# Patient Record
Sex: Female | Born: 1980 | Race: White | Hispanic: No | Marital: Single | Smoking: Current every day smoker
Health system: Southern US, Community
[De-identification: ages and names within clinical notes are randomized; demographics above are authoritative.]

## PROBLEM LIST (undated history)

## (undated) DIAGNOSIS — R569 Unspecified convulsions: Secondary | ICD-10-CM

## (undated) DIAGNOSIS — N63 Unspecified lump in unspecified breast: Secondary | ICD-10-CM

## (undated) DIAGNOSIS — R87619 Unspecified abnormal cytological findings in specimens from cervix uteri: Secondary | ICD-10-CM

## (undated) DIAGNOSIS — D649 Anemia, unspecified: Secondary | ICD-10-CM

## (undated) DIAGNOSIS — R519 Headache, unspecified: Secondary | ICD-10-CM

## (undated) DIAGNOSIS — R51 Headache: Principal | ICD-10-CM

## (undated) DIAGNOSIS — Z87442 Personal history of urinary calculi: Secondary | ICD-10-CM

## (undated) HISTORY — DX: Unspecified abnormal cytological findings in specimens from cervix uteri: R87.619

## (undated) HISTORY — PX: OVARIAN CYST REMOVAL: SHX89

## (undated) HISTORY — PX: CERVIX REMOVAL: SHX592

## (undated) HISTORY — DX: Headache: R51

## (undated) HISTORY — DX: Headache, unspecified: R51.9

## (undated) HISTORY — PX: ABLATION: SHX5711

## (undated) HISTORY — PX: OTHER SURGICAL HISTORY: SHX169

---
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
## 2004-06-11 ENCOUNTER — Emergency Department: Payer: Self-pay | Admitting: General Practice

## 2004-10-09 ENCOUNTER — Emergency Department: Payer: Self-pay | Admitting: Unknown Physician Specialty

## 2005-02-21 ENCOUNTER — Emergency Department: Payer: Self-pay | Admitting: Emergency Medicine

## 2005-11-08 ENCOUNTER — Emergency Department: Payer: Self-pay | Admitting: General Practice

## 2006-01-13 ENCOUNTER — Emergency Department: Payer: Self-pay | Admitting: Emergency Medicine

## 2006-01-16 ENCOUNTER — Emergency Department: Payer: Self-pay | Admitting: Emergency Medicine

## 2006-01-17 ENCOUNTER — Ambulatory Visit: Payer: Self-pay | Admitting: Emergency Medicine

## 2006-08-15 ENCOUNTER — Emergency Department: Payer: Self-pay | Admitting: Emergency Medicine

## 2008-03-17 ENCOUNTER — Emergency Department: Payer: Self-pay | Admitting: Emergency Medicine

## 2008-08-12 HISTORY — PX: TONSILLECTOMY: SUR1361

## 2009-02-19 ENCOUNTER — Inpatient Hospital Stay: Payer: Self-pay | Admitting: Vascular Surgery

## 2009-08-14 ENCOUNTER — Emergency Department: Payer: Self-pay | Admitting: Emergency Medicine

## 2009-08-29 ENCOUNTER — Ambulatory Visit: Payer: Self-pay | Admitting: Otolaryngology

## 2009-12-16 ENCOUNTER — Emergency Department: Payer: Self-pay | Admitting: Emergency Medicine

## 2010-01-26 ENCOUNTER — Emergency Department: Payer: Self-pay | Admitting: Emergency Medicine

## 2010-03-05 ENCOUNTER — Emergency Department: Payer: Self-pay | Admitting: Emergency Medicine

## 2010-04-19 ENCOUNTER — Emergency Department: Payer: Self-pay | Admitting: Emergency Medicine

## 2010-08-12 HISTORY — PX: APPENDECTOMY: SHX54

## 2010-10-25 ENCOUNTER — Emergency Department: Payer: Self-pay | Admitting: Emergency Medicine

## 2011-03-19 ENCOUNTER — Observation Stay: Payer: Self-pay | Admitting: Obstetrics and Gynecology

## 2011-03-27 ENCOUNTER — Observation Stay: Payer: Self-pay | Admitting: Obstetrics and Gynecology

## 2011-03-29 ENCOUNTER — Observation Stay: Payer: Self-pay | Admitting: Obstetrics & Gynecology

## 2011-04-01 ENCOUNTER — Inpatient Hospital Stay: Payer: Self-pay | Admitting: Advanced Practice Midwife

## 2011-07-01 ENCOUNTER — Ambulatory Visit: Payer: Self-pay

## 2011-07-03 ENCOUNTER — Ambulatory Visit: Payer: Self-pay | Admitting: Obstetrics and Gynecology

## 2011-07-05 ENCOUNTER — Ambulatory Visit: Payer: Self-pay | Admitting: Obstetrics and Gynecology

## 2011-07-08 LAB — PATHOLOGY REPORT

## 2011-07-25 ENCOUNTER — Emergency Department: Payer: Self-pay | Admitting: Emergency Medicine

## 2011-10-16 ENCOUNTER — Emergency Department: Payer: Self-pay | Admitting: *Deleted

## 2012-06-10 ENCOUNTER — Ambulatory Visit: Payer: Self-pay | Admitting: Obstetrics & Gynecology

## 2012-06-10 LAB — CBC
HCT: 41.1 % (ref 35.0–47.0)
HGB: 14.5 g/dL (ref 12.0–16.0)
MCH: 31 pg (ref 26.0–34.0)
MCHC: 35.3 g/dL (ref 32.0–36.0)
MCV: 88 fL (ref 80–100)
Platelet: 163 10*3/uL (ref 150–440)
RBC: 4.68 10*6/uL (ref 3.80–5.20)
RDW: 12.7 % (ref 11.5–14.5)
WBC: 4.6 10*3/uL (ref 3.6–11.0)

## 2012-06-10 LAB — PREGNANCY, URINE: Pregnancy Test, Urine: NEGATIVE m[IU]/mL

## 2012-06-18 ENCOUNTER — Ambulatory Visit: Payer: Self-pay | Admitting: Obstetrics & Gynecology

## 2012-06-22 LAB — PATHOLOGY REPORT

## 2012-12-23 ENCOUNTER — Emergency Department: Payer: Self-pay | Admitting: Emergency Medicine

## 2013-04-06 ENCOUNTER — Emergency Department: Payer: Self-pay | Admitting: Emergency Medicine

## 2013-09-09 ENCOUNTER — Ambulatory Visit (INDEPENDENT_AMBULATORY_CARE_PROVIDER_SITE_OTHER): Payer: BC Managed Care – PPO | Admitting: Internal Medicine

## 2013-09-09 ENCOUNTER — Encounter: Payer: Self-pay | Admitting: Internal Medicine

## 2013-09-09 ENCOUNTER — Telehealth: Payer: Self-pay

## 2013-09-09 VITALS — BP 98/64 | HR 81 | Temp 98.5°F | Ht 64.5 in | Wt 155.5 lb

## 2013-09-09 DIAGNOSIS — R51 Headache: Secondary | ICD-10-CM

## 2013-09-09 DIAGNOSIS — F39 Unspecified mood [affective] disorder: Secondary | ICD-10-CM

## 2013-09-09 DIAGNOSIS — R519 Headache, unspecified: Secondary | ICD-10-CM

## 2013-09-09 DIAGNOSIS — R4586 Emotional lability: Secondary | ICD-10-CM

## 2013-09-09 MED ORDER — BUTALBITAL-APAP-CAFFEINE 50-325-40 MG PO TABS: 1.0000 | ORAL_TABLET | Freq: Two times a day (BID) | ORAL | Status: DC | PRN

## 2013-09-09 MED ORDER — SERTRALINE HCL 50 MG PO TABS: 50.0000 mg | ORAL_TABLET | Freq: Every day | ORAL | Status: DC

## 2013-09-09 NOTE — Progress Notes (Signed)
Pre-visit discussion using our clinic review tool. No additional management support is needed unless otherwise documented below in the visit note.  

## 2013-09-09 NOTE — Assessment & Plan Note (Signed)
Likely tensions r/t stress Discussed the importance of stress relieving techniques Asked her to keep a headache log and will discuss at her next visit Will try fioricet for headaches

## 2013-09-09 NOTE — Assessment & Plan Note (Signed)
R/t stress at home She is having issues with her teenage daughter Will start zoloft 50 mg daily  Let me know in 4-6 weeks how you are doing.

## 2013-09-09 NOTE — Telephone Encounter (Signed)
Diane Roberts with [facility] [address]wanted to confirm Fiorcet rx was faxed by our office. Webb Silversmith NP said her CMA did fax Fiorcet to pharmacy. Diane Roberts voiced understanding.

## 2013-09-09 NOTE — Patient Instructions (Signed)

## 2013-09-09 NOTE — Progress Notes (Signed)
HPI  She does report frequent headaches. These headaches started about 6 months ago. She reports that is starts in her temples and then spreads to the front of her head. She reports it feels like a squeezing sensation or throbbing. She reports that it sometimes settles behind her left eye and cause blurry vision in that eye. She has tried execdrin migraine and tylenol without much relief. Sometimes laying down in a dark room makes her feel better. Additionally, she c/o mood swings. This started about 1 year ago. She reports that she feels fine one minute then feels like she can fly off the handle the next minute. She reports that it is more anger and anxiety rather than depression. She is under a lot of stress at home. Additionally, she does have some concerns of swelling in her nasal passages and sores in her nose. She reports that her nose feels dry. She does blow her nose frequently at work and feels like the air is really dry in that environment. She has not noticed any drainage or blood from her nose. She has no history of allergies or asthma. She does smoke.  Past Medical History  Diagnosis Date  . Frequent headaches     Current Outpatient Prescriptions  Medication Sig Dispense Refill  . medroxyPROGESTERone (DEPO-PROVERA) 150 MG/ML injection Inject 150 mg into the muscle every 3 (three) months. Last shot 06/22/2013       No current facility-administered medications for this visit.    No Known Allergies  Family History  Problem Relation Age of Onset  . Hypertension Mother   . Kidney disease Mother   . Hyperlipidemia Mother     History   Social History  . Marital Status: Single    Spouse Name: N/A    Number of Children: N/A  . Years of Education: N/A   Occupational History  . Not on file.   Social History Main Topics  . Smoking status: Current Every Day Smoker -- 0.50 packs/day for 18 years    Types: Cigarettes  . Smokeless tobacco: Never Used  . Alcohol Use: Yes   Comment: occasional  . Drug Use: No  . Sexual Activity: Not on file   Other Topics Concern  . Not on file   Social History Narrative  . No narrative on file    ROS:  Constitutional: Denies fever, malaise, fatigue, headache or abrupt weight changes.  HEENT: Pt reports nasal dryness and soars in her nose. Denies eye pain, eye redness, ear pain, ringing in the ears, wax buildup, runny nose, nasal congestion, bloody nose, or sore throat. Neurological: Denies dizziness, difficulty with memory, difficulty with speech or problems with balance and coordination.  Psych: Pt reports stress, anger outburst. Denies depression, SI/HI.  No other specific complaints in a complete review of systems (except as listed in HPI above).  PE:  BP 98/64  Pulse 81  Temp(Src) 98.5 F (36.9 C) (Oral)  Ht 5' 4.5" (1.638 m)  Wt 155 lb 8 oz (70.534 kg)  BMI 26.29 kg/m2  SpO2 98%  LMP 12/10/2012 Wt Readings from Last 3 Encounters:  09/09/13 155 lb 8 oz (70.534 kg)    General: Appears her stated age, well developed, well nourished in NAD. HEENT: Head: normal shape and size; Eyes: sclera white, no icterus, conjunctiva pink, PERRLA and EOMs intact; Ears: Tm's gray and intact, normal light reflex; Nose: mucosa pink and dry, septum midline; Throat/Mouth: Teeth present, mucosa pink and moist, no lesions or ulcerations noted.  Cardiovascular:  Normal rate and rhythm. S1,S2 noted.  No murmur, rubs or gallops noted. No JVD or BLE edema. No carotid bruits noted. Pulmonary/Chest: Normal effort and positive vesicular breath sounds. No respiratory distress. No wheezes, rales or ronchi noted.  Psychiatric: Mood and affect normal. Behavior is normal. Judgment and thought content normal.      Assessment and Plan:  Dry nasal passages:  Likely due to the dry winter air and environmental work conditions Advised her to try OTC saline spray Stop smoking  RTC in 3 months for your physical and to discuss mood swings and  headaches. Please bring your log.

## 2013-09-10 ENCOUNTER — Telehealth: Payer: Self-pay | Admitting: Internal Medicine

## 2013-09-10 NOTE — Telephone Encounter (Signed)
Relevant patient education assigned to patient using Emmi. ° °

## 2013-11-04 ENCOUNTER — Telehealth: Payer: Self-pay | Admitting: Internal Medicine

## 2013-11-04 ENCOUNTER — Encounter: Payer: Self-pay | Admitting: Internal Medicine

## 2013-11-04 ENCOUNTER — Ambulatory Visit (INDEPENDENT_AMBULATORY_CARE_PROVIDER_SITE_OTHER): Payer: BC Managed Care – PPO | Admitting: Internal Medicine

## 2013-11-04 VITALS — BP 100/70 | HR 74 | Temp 98.2°F | Wt 159.0 lb

## 2013-11-04 DIAGNOSIS — M545 Low back pain, unspecified: Secondary | ICD-10-CM

## 2013-11-04 DIAGNOSIS — M5430 Sciatica, unspecified side: Secondary | ICD-10-CM

## 2013-11-04 MED ORDER — PREDNISONE 10 MG PO TABS
ORAL_TABLET | ORAL | Status: DC
Start: 2013-11-04 — End: 2014-02-24

## 2013-11-04 NOTE — Progress Notes (Signed)
Subjective:    Patient ID: Diane Roberts, female    DOB: 12-31-1980, 33 y.o.   MRN: [id number]  HPI  Pt presents to the clinic today with c/o low back pain. She reports this started 1 month ago. The pain radiates down her left leg but she reports it is not numbness and tingling. She was seen by chiropractor who did an xray and told L4 compression. She has tried Ibuprofen OTC without much relief. She has also been told that she has scoliosis. She has had pain like this in the past about 7 years ago.  Review of Systems      Past Medical History  Diagnosis Date  . Frequent headaches     Current Outpatient Prescriptions  Medication Sig Dispense Refill  . ibuprofen (ADVIL,MOTRIN) 200 MG tablet Take 800 mg by mouth every 4 (four) hours as needed.      . medroxyPROGESTERone (DEPO-PROVERA) 150 MG/ML injection Inject 150 mg into the muscle every 3 (three) months. Last shot 06/22/2013      . sertraline (ZOLOFT) 50 MG tablet Take 1 tablet (50 mg total) by mouth daily.  30 tablet  3  . butalbital-acetaminophen-caffeine (FIORICET, ESGIC) 50-325-40 MG per tablet Take 1 tablet by mouth 2 (two) times daily as needed for headache.  14 tablet  0   No current facility-administered medications for this visit.    No Known Allergies  Family History  Problem Relation Age of Onset  . Hypertension Mother   . Kidney disease Mother   . Hyperlipidemia Mother     History   Social History  . Marital Status: Single    Spouse Name: N/A    Number of Children: N/A  . Years of Education: N/A   Occupational History  . Not on file.   Social History Main Topics  . Smoking status: Current Every Day Smoker -- 0.50 packs/day for 18 years    Types: Cigarettes  . Smokeless tobacco: Never Used  . Alcohol Use: Yes     Comment: occasional  . Drug Use: No  . Sexual Activity: Yes   Other Topics Concern  . Not on file   Social History Narrative  . No narrative on file     Constitutional: Denies  fever, malaise, fatigue, headache or abrupt weight changes.  Musculoskeletal: Pt reports low back pain. Denies difficulty with gait, or joint pain and swelling.    No other specific complaints in a complete review of systems (except as listed in HPI above).  Objective:   Physical Exam   BP 100/70  Pulse 74  Temp(Src) 98.2 F (36.8 C) (Oral)  Wt 159 lb (72.122 kg)  SpO2 98% Wt Readings from Last 3 Encounters:  11/04/13 159 lb (72.122 kg)  09/09/13 155 lb 8 oz (70.534 kg)    General: Appears her stated age, well developed, well nourished in NAD. Cardiovascular: Normal rate and rhythm. S1,S2 noted.  No murmur, rubs or gallops noted. No JVD or BLE edema. No carotid bruits noted. Pulmonary/Chest: Normal effort and positive vesicular breath sounds. No respiratory distress. No wheezes, rales or ronchi noted.  Musculoskeletal: Decreased flexion secondary to pain. Normal extension. No difficulty with gait. Tender with palpation of the left SI joint.         Assessment & Plan:   Sciatica neuralgia with SI joint dysfunction:  Will try prednisone Avoid ibuprofen while on prednisone Stretching exercise given If no improvement, will consider xray vs PT  RTC as needed or if symptoms  persist or worsen

## 2013-11-04 NOTE — Progress Notes (Signed)
Pre visit review using our clinic review tool, if applicable. No additional management support is needed unless otherwise documented below in the visit note. 

## 2013-11-04 NOTE — Telephone Encounter (Signed)
Relevant patient education assigned to patient using Emmi. ° °

## 2013-11-04 NOTE — Patient Instructions (Addendum)

## 2013-12-10 ENCOUNTER — Other Ambulatory Visit: Payer: Self-pay | Admitting: *Deleted

## 2013-12-10 MED ORDER — MEDROXYPROGESTERONE ACETATE 150 MG/ML IM SUSP: 150.0000 mg | INTRAMUSCULAR | Status: DC

## 2013-12-10 NOTE — Telephone Encounter (Signed)
Ok to fill 

## 2013-12-31 ENCOUNTER — Telehealth: Payer: Self-pay

## 2013-12-31 NOTE — Telephone Encounter (Signed)
Is her gyn prescribing the depo? If so she needs to call their office

## 2013-12-31 NOTE — Telephone Encounter (Signed)
Pt took depo injection 3-4 weeks ago (was given to pt by her mother). Pt has been on depo inj for over a year; pt has had menstrual period (bright red blood) for 3-4 weeks and pt wants to know if she should have another Depo shot. No abd pain or cramping or clotting.Please advise.Pt request cb.

## 2013-12-31 NOTE — Telephone Encounter (Signed)
Pt states the refill was given by you and states there is no way she can be pregnant as she has not been sexually active in over a year

## 2013-12-31 NOTE — Telephone Encounter (Signed)
Pt is aware as instructed and i told pt that it would be best if she would purchase a calendar and mark off each week so that she can ensure it is exactly 12 weeks in between shots

## 2013-12-31 NOTE — Telephone Encounter (Signed)
Ok to continue. She needs to make sure she is giving it every 90 days. If she is late, that may be why she is experiencing the bleeding.

## 2013-12-31 NOTE — Telephone Encounter (Signed)
She should probably also make sure she is not pregnant

## 2014-02-24 ENCOUNTER — Telehealth: Payer: Self-pay | Admitting: Internal Medicine

## 2014-02-24 ENCOUNTER — Ambulatory Visit (INDEPENDENT_AMBULATORY_CARE_PROVIDER_SITE_OTHER): Payer: BC Managed Care – PPO | Admitting: Internal Medicine

## 2014-02-24 ENCOUNTER — Other Ambulatory Visit (HOSPITAL_COMMUNITY)
Admission: RE | Admit: 2014-02-24 | Discharge: 2014-02-24 | Disposition: A | Discharge status: Home / Self Care | Payer: BC Managed Care – PPO | Source: Ambulatory Visit | Attending: Internal Medicine | Admitting: Internal Medicine

## 2014-02-24 ENCOUNTER — Encounter: Payer: Self-pay | Admitting: Internal Medicine

## 2014-02-24 VITALS — BP 102/60 | HR 76 | Temp 98.4°F | Wt 155.5 lb

## 2014-02-24 DIAGNOSIS — Z01419 Encounter for gynecological examination (general) (routine) without abnormal findings: Secondary | ICD-10-CM | POA: Insufficient documentation

## 2014-02-24 DIAGNOSIS — N921 Excessive and frequent menstruation with irregular cycle: Secondary | ICD-10-CM

## 2014-02-24 DIAGNOSIS — Z1151 Encounter for screening for human papillomavirus (HPV): Secondary | ICD-10-CM | POA: Insufficient documentation

## 2014-02-24 LAB — CBC
HCT: 44 % (ref 36.0–46.0)
Hemoglobin: 15 g/dL (ref 12.0–15.0)
MCHC: 34.1 g/dL (ref 30.0–36.0)
MCV: 91.6 fl (ref 78.0–100.0)
Platelets: 161 10*3/uL (ref 150.0–400.0)
RBC: 4.8 Mil/uL (ref 3.87–5.11)
RDW: 12.4 % (ref 11.5–15.5)
WBC: 6.5 10*3/uL (ref 4.0–10.5)

## 2014-02-24 LAB — COMPREHENSIVE METABOLIC PANEL
ALT: 16 U/L (ref 0–35)
AST: 16 U/L (ref 0–37)
Albumin: 4.3 g/dL (ref 3.5–5.2)
Alkaline Phosphatase: 44 U/L (ref 39–117)
BUN: 15 mg/dL (ref 6–23)
CO2: 25 mEq/L (ref 19–32)
Calcium: 9.7 mg/dL (ref 8.4–10.5)
Chloride: 107 mEq/L (ref 96–112)
Creatinine, Ser: 1 mg/dL (ref 0.4–1.2)
GFR: 67.49 mL/min (ref >60.00)
Glucose, Bld: 97 mg/dL (ref 70–99)
Potassium: 3.6 mEq/L (ref 3.5–5.1)
Sodium: 141 mEq/L (ref 135–145)
Total Bilirubin: 0.9 mg/dL (ref 0.2–1.2)
Total Protein: 6.9 g/dL (ref 6.0–8.3)

## 2014-02-24 LAB — TSH: TSH: 0.61 u[IU]/mL (ref 0.35–4.50)

## 2014-02-24 NOTE — Telephone Encounter (Signed)
Relevant patient education assigned to patient using Emmi. ° °

## 2014-02-24 NOTE — Progress Notes (Signed)
Pre visit review using our clinic review tool, if applicable. No additional management support is needed unless otherwise documented below in the visit note. 

## 2014-02-24 NOTE — Addendum Note (Signed)
Addended by: Lurlean Nanny on: 02/24/2014 02:53 PM   Modules accepted: Orders

## 2014-02-24 NOTE — Patient Instructions (Addendum)
Metrorrhagia  °Metrorrhagia is uterine bleeding at irregular intervals, especially between menstrual periods.  °CAUSES  °· Dysfunctional uterine bleeding. °· Uterine lining growing outside the uterus (endometriosis). °· Embryo adhering to uterine wall (implantation). °· Pregnancy growing in the fallopian tubes (ectopic pregnancy). °· Miscarriage. °· Menopause. °· Cancer of the reproduction organs. °· Certain drugs such as hormonal contraceptives. °· Inherited bleeding disorders. °· Trauma. °· Uterine fibroids. °· Sexually transmitted diseases (STDs). °· Polycystic ovarian disease. °DIAGNOSIS °· A history will be taken. °· A physical exam will be performed. °· Other tests may include: °¨ Blood tests. °¨ A pregnancy test. °¨ An ultrasound of the abdomen and pelvis. °¨ A biopsy of the uterine lining. °¨ A MRI or CT scan of the abdomen and pelvis. °TREATMENT °Treatment will depend on the cause. °HOME CARE INSTRUCTIONS  °· Take all medicines as directed by your caregiver. Do not change or switch medicines without talking to your caregiver. °· Take all iron supplements exactly as directed by your caregiver. Iron supplements help to replace the iron your body loses from irregular bleeding. If you become constipated, increase the amount of fiber, fruits, and vegetables in your diet. °· Do not take aspirin or medicines that contain aspirin for 1 week before your menstrual period or during your menstrual period. Aspirin may increase the bleeding. °· Rest as much as possible if you change your sanitary pad or tampon more than once every 2 hours. °· Eat well-balanced meals including foods high in iron, such as green leafy vegetables, red meat, liver, eggs, and whole-grain breads and cereals. °· Do not try to lose weight until the abnormal bleeding is controlled and your blood iron level is back to normal. °SEEK MEDICAL CARE IF:  °· You have nausea and vomiting, or you cannot keep foods down. °· You feel dizzy or have diarrhea  while taking medicine. °· You have any problems that may be related to the medicine you are taking. °SEEK IMMEDIATE MEDICAL CARE IF:  °· You have a fever. °· You develop chills. °· You become lightheaded or faint. °· You need to change your sanitary pad or tampon more than once an hour. °· Your bleeding becomes heavy. °· You begin to pass clots or tissue. °MAKE SURE YOU:  °· Understand these instructions. °· Will watch your condition. °· Will get help right away if you are not doing well or get worse. °Document Released: 07/29/2005 Document Revised: 10/21/2011 Document Reviewed: 02/25/2011 °[employer]® Patient Information ©2015 [employer]. This information is not intended to replace advice given to you by your health care provider. Make sure you discuss any questions you have with your health care provider. ° °

## 2014-02-24 NOTE — Progress Notes (Signed)
Subjective:    Patient ID: Diane Roberts, female    DOB: 14-Aug-1980, 33 y.o.   MRN: [id number]  HPI  Pt presents to the clinic today with c/o prolonged bleeding from her menses. She reports she has had constant bleeding or spotting since 12/2013. She denies abdominal pain, cramping or large clots. She reports that Depo injection  in May, was not given correctly. She reports that she is not sexually active. Her last pap smear was in 2012. Her last Depo injection was February 11, 3014. She has never had trouble with her menses in the past. She does only have 1 ovary.  Review of Systems      Past Medical History  Diagnosis Date  . Frequent headaches     Current Outpatient Prescriptions  Medication Sig Dispense Refill  . butalbital-acetaminophen-caffeine (FIORICET, ESGIC) 50-325-40 MG per tablet Take 1 tablet by mouth 2 (two) times daily as needed for headache.  14 tablet  0  . ibuprofen (ADVIL,MOTRIN) 200 MG tablet Take 800 mg by mouth every 4 (four) hours as needed.      . medroxyPROGESTERone (DEPO-PROVERA) 150 MG/ML injection Inject 1 mL (150 mg total) into the muscle every 3 (three) months. Last shot 06/22/2013  1 mL  2  . sertraline (ZOLOFT) 50 MG tablet Take 1 tablet (50 mg total) by mouth daily.  30 tablet  3   No current facility-administered medications for this visit.    No Known Allergies  Family History  Problem Relation Age of Onset  . Hypertension Mother   . Kidney disease Mother   . Hyperlipidemia Mother     History   Social History  . Marital Status: Single    Spouse Name: N/A    Number of Children: N/A  . Years of Education: N/A   Occupational History  . Not on file.   Social History Main Topics  . Smoking status: Current Every Day Smoker -- 0.50 packs/day for 18 years    Types: Cigarettes  . Smokeless tobacco: Never Used  . Alcohol Use: Yes     Comment: occasional  . Drug Use: No  . Sexual Activity: Yes   Other Topics Concern  . Not on file    Social History Narrative  . No narrative on file     Constitutional: Denies fever, malaise, fatigue, headache or abrupt weight changes.  GU: Pt reports prolonged vaginal bleeding. Denies urgency, frequency, pain with urination, burning sensation, blood in urine, odor or discharge.   No other specific complaints in a complete review of systems (except as listed in HPI above).  Objective:   Physical Exam  BP 102/60  Pulse 76  Temp(Src) 98.4 F (36.9 C) (Oral)  Wt 155 lb 8 oz (70.534 kg)  SpO2 98%  LMP 12/30/2013 Wt Readings from Last 3 Encounters:  02/24/14 155 lb 8 oz (70.534 kg)  11/04/13 159 lb (72.122 kg)  09/09/13 155 lb 8 oz (70.534 kg)    General: Appears her stated age, well developed, well nourished in NAD. Cardiovascular: Normal rate and rhythm. S1,S2 noted.  No murmur, rubs or gallops noted. No JVD or BLE edema. No carotid bruits noted. Pulmonary/Chest: Normal effort and positive vesicular breath sounds. No respiratory distress. No wheezes, rales or ronchi noted.  Abdomen: Soft and nontender. Normal bowel sounds, no bruits noted. No distention or masses noted. Liver, spleen and kidneys non palpable. Pelvic: Normal female anatomy. There is some cervical irritation from the 11 oclock to 1 oclock position.  Bleeding noted from the cervical os. [place]a non palpable. No CMT.        Assessment & Plan:   Metrorrhagia:  Pap obtained today- will call you with the results Will check CBC, CMET, and TSH She has already had her Depo this month-otherwise I would have had her not take it She may need a ultrasound (pelvic/transvaginal)  Will follow up with you after the labs come back

## 2014-02-28 LAB — CYTOLOGY - PAP

## 2014-03-01 ENCOUNTER — Other Ambulatory Visit: Payer: BC Managed Care – PPO

## 2014-03-09 ENCOUNTER — Encounter: Payer: Self-pay | Admitting: Internal Medicine

## 2014-03-09 ENCOUNTER — Ambulatory Visit (INDEPENDENT_AMBULATORY_CARE_PROVIDER_SITE_OTHER): Payer: BC Managed Care – PPO | Admitting: Internal Medicine

## 2014-03-09 VITALS — BP 90/58 | HR 83 | Temp 98.5°F | Ht 64.25 in | Wt 156.0 lb

## 2014-03-09 DIAGNOSIS — N920 Excessive and frequent menstruation with regular cycle: Secondary | ICD-10-CM

## 2014-03-09 DIAGNOSIS — Z0000 Encounter for general adult medical examination without abnormal findings: Secondary | ICD-10-CM

## 2014-03-09 DIAGNOSIS — N921 Excessive and frequent menstruation with irregular cycle: Secondary | ICD-10-CM

## 2014-03-09 NOTE — Patient Instructions (Addendum)

## 2014-03-09 NOTE — Progress Notes (Signed)
Subjective:    Patient ID: Diane Roberts, female    DOB: [birth year], 34 y.o.   MRN: [id number]  HPI  Pt presents to the clinic today for her annual exam. She reports that she continues to have vaginal bleeding. Her CBC and pap were normal.  Flu: 06/2013 Tetanus: more than 10 years ago LMP: she has been bleeding for the last few weeks Pap Smear: 02/2014 Dentist: [email or web address]  Review of Systems  Past Medical History  Diagnosis Date  . Frequent headaches     Current Outpatient Prescriptions  Medication Sig Dispense Refill  . ibuprofen (ADVIL,MOTRIN) 200 MG tablet Take 800 mg by mouth every 4 (four) hours as needed.      . medroxyPROGESTERone (DEPO-PROVERA) 150 MG/ML injection Inject 1 mL (150 mg total) into the muscle every 3 (three) months. Last shot 06/22/2013  1 mL  2   No current facility-administered medications for this visit.    No Known Allergies  Family History  Problem Relation Age of Onset  . Hypertension Mother   . Kidney disease Mother   . Hyperlipidemia Mother     History   Social History  . Marital Status: Single    Spouse Name: N/A    Number of Children: N/A  . Years of Education: N/A   Occupational History  . Not on file.   Social History Main Topics  . Smoking status: Current Every Day Smoker -- 0.50 packs/day for 18 years    Types: Cigarettes  . Smokeless tobacco: Never Used  . Alcohol Use: Yes     Comment: occasional  . Drug Use: No  . Sexual Activity: Yes   Other Topics Concern  . Not on file   Social History Narrative  . No narrative on file     Constitutional: Denies fever, malaise, fatigue, headache or abrupt weight changes.  HEENT: Denies eye pain, eye redness, ear pain, ringing in the ears, wax buildup, runny nose, nasal congestion, bloody nose, or sore throat. Respiratory: Denies difficulty breathing, shortness of breath, cough or sputum production.   Cardiovascular: Denies chest pain, chest tightness, palpitations or  swelling in the hands or feet.  Gastrointestinal: Denies abdominal pain, bloating, constipation, diarrhea or blood in the stool.  GU: Pt reports vaginal bleeding. Denies urgency, frequency, pain with urination, burning sensation, blood in urine, odor or discharge. Musculoskeletal: Denies decrease in range of motion, difficulty with gait, muscle pain or joint pain and swelling.  Skin: Denies redness, rashes, lesions or ulcercations.  Neurological: Denies dizziness, difficulty with memory, difficulty with speech or problems with balance and coordination.   No other specific complaints in a complete review of systems (except as listed in HPI above).     Objective:   Physical Exam   BP 90/58  Pulse 83  Temp(Src) 98.5 F (36.9 C) (Oral)  Ht 5' 4.25" (1.632 m)  Wt 156 lb (70.761 kg)  BMI 26.57 kg/m2  SpO2 98%  LMP 12/30/2013 Wt Readings from Last 3 Encounters:  03/09/14 156 lb (70.761 kg)  02/24/14 155 lb 8 oz (70.534 kg)  11/04/13 159 lb (72.122 kg)    General: Appears heer stated age, well developed, well nourished in NAD. Skin: Warm, dry and intact. No rashes, lesions or ulcerations noted. HEENT: Head: normal shape and size; Eyes: sclera white, no icterus, conjunctiva pink, PERRLA and EOMs intact; Ears: Tm's gray and intact, normal light reflex; Nose: mucosa pink and moist, septum midline; Throat/Mouth: Teeth present, mucosa pink and moist, no exudate,  lesions or ulcerations noted.  Neck: Normal range of motion. Neck supple, trachea midline. No massses, lumps or thyromegaly present.  Cardiovascular: Normal rate and rhythm. S1,S2 noted.  No murmur, rubs or gallops noted. No JVD or BLE edema. No carotid bruits noted. Pulmonary/Chest: Normal effort and positive vesicular breath sounds. No respiratory distress. No wheezes, rales or ronchi noted.  Abdomen: Soft and nontender. Normal bowel sounds, no bruits noted. No distention or masses noted. Liver, spleen and kidneys non  palpable. Musculoskeletal: Normal range of motion. No signs of joint swelling. No difficulty with gait.  Neurological: Alert and oriented. Cranial nerves II-XII intact. Coordination normal. +DTRs bilaterally. Psychiatric: Mood and affect normal. Behavior is normal. Judgment and thought content normal.     BMET    Component Value Date/Time   NA 141 02/24/2014 1338   K 3.6 02/24/2014 1338   CL 107 02/24/2014 1338   CO2 25 02/24/2014 1338   GLUCOSE 97 02/24/2014 1338   BUN 15 02/24/2014 1338   CREATININE 1.0 02/24/2014 1338   CALCIUM 9.7 02/24/2014 1338    Lipid Panel  No results found for this basename: chol, trig, hdl, cholhdl, vldl, ldlcalc    CBC    Component Value Date/Time   WBC 6.5 02/24/2014 1338   RBC 4.80 02/24/2014 1338   HGB 15.0 02/24/2014 1338   HCT 44.0 02/24/2014 1338   PLT 161.0 02/24/2014 1338   MCV 91.6 02/24/2014 1338   MCHC 34.1 02/24/2014 1338   RDW 12.4 02/24/2014 1338    Hgb A1C No results found for this basename: HGBA1C        Assessment & Plan:   Preventative Health Maintenance:  Will give Tdap today Encouraged her to work on diet and exercise Labs reviewed-normal Encouraged her to quit smoking  Menorrhagia:  Will obtain pelvic and transvaginal ultrasound  Will follow up with you in after ultrasound report is back

## 2014-03-09 NOTE — Progress Notes (Signed)
Pre visit review using our clinic review tool, if applicable. No additional management support is needed unless otherwise documented below in the visit note. 

## 2014-03-10 ENCOUNTER — Ambulatory Visit: Payer: Self-pay | Admitting: Internal Medicine

## 2014-03-14 ENCOUNTER — Encounter: Payer: Self-pay | Admitting: Internal Medicine

## 2014-03-15 ENCOUNTER — Encounter: Payer: Self-pay | Admitting: *Deleted

## 2014-07-22 ENCOUNTER — Emergency Department: Payer: Self-pay | Admitting: Emergency Medicine

## 2014-07-22 LAB — CBC
HCT: 44.1 % (ref 35.0–47.0)
HGB: 14.8 g/dL (ref 12.0–16.0)
MCH: 30.6 pg (ref 26.0–34.0)
MCHC: 33.7 g/dL (ref 32.0–36.0)
MCV: 91 fL (ref 80–100)
Platelet: 138 10*3/uL — ABNORMAL LOW (ref 150–440)
RBC: 4.85 10*6/uL (ref 3.80–5.20)
RDW: 12.3 % (ref 11.5–14.5)
WBC: 4.6 10*3/uL (ref 3.6–11.0)

## 2014-07-22 LAB — BASIC METABOLIC PANEL
Anion Gap: 7 (ref 7–16)
BUN: 8 mg/dL (ref 7–18)
Calcium, Total: 8.8 mg/dL (ref 8.5–10.1)
Chloride: 108 mmol/L — ABNORMAL HIGH (ref 98–107)
Co2: 25 mmol/L (ref 21–32)
Creatinine: 0.83 mg/dL (ref 0.60–1.30)
EGFR (African American): >60
EGFR (Non-African Amer.): >60
Glucose: 109 mg/dL — ABNORMAL HIGH (ref 65–99)
Osmolality: 278 (ref 275–301)
Potassium: 3.3 mmol/L — ABNORMAL LOW (ref 3.5–5.1)
Sodium: 140 mmol/L (ref 136–145)

## 2014-07-22 LAB — TROPONIN I: Troponin-I: <0.02 ng/mL

## 2014-11-29 NOTE — Op Note (Signed)
PATIENT NAME:  Diane Roberts, Diane Roberts MR#:  [id number] DATE OF BIRTH:  1980/12/01  DATE OF PROCEDURE:  06/18/2012  PREOPERATIVE DIAGNOSES:  1. Pelvic pain.  2. Ovarian cyst. 3. Menorrhagia.   POSTOPERATIVE DIAGNOSES:  1. Pelvic pain.  2. Ovarian cyst. 3. Menorrhagia.  4. Dermoid cyst.   PROCEDURES:  1. Operative laparoscopy with right ovarian cystectomy. 2. Dilatation and curettage.   SURGEON: Glean Salen, MD   ANESTHESIA: General.   ESTIMATED BLOOD LOSS: Minimal.   COMPLICATIONS: None.   FINDINGS: There was a right dermoid cyst. Normal left ovary and uterus.   DISPOSITION: To recovery room in stable condition.   TECHNIQUE: The patient is prepped and draped in the usual sterile fashion after adequate anesthesia is obtained in the dorsal lithotomy position. Bladder is drained with a Robinson catheter and sponge stick is placed vaginally for manipulation purposes.   Attention is then turned to the abdomen where a Veress needle is inserted through a 5 mm infraumbilical incision after Marcaine is used to anesthetize the skin. Veress needle placement is confirmed using the hanging drop technique and the abdomen is then insufflated with CO2 gas. A 5 mm trocar is then inserted under direct visualization with the laparoscope with no injuries or bleeding noted. The patient is placed in Trendelenburg positioning and the above-mentioned findings are visualized.   A 5 mm trocar is placed in the right lower quadrant and an 11 mm trocar is placed in the suprapubic region for laparoscopic manipulation purposes. No injuries or bleeding noted with the placement of these portals. There are no adhesions noted and the ovary is freely mobile on the right side. The cyst is identified and there is a demarcation of normal ovary and it is amputated at this point using a 5 mm Harmonic scalpel with excellent hemostasis noted. Normal ovarian tissue is left on the normal adnexal pedicle. The cystic mass that has  been excised is placed in an Endopouch. It is ruptured and is noticed to have yellow sebaceous-type fluid leaking from it but this is contained within the Endopouch bag. Endopouch is removed through the suprapubic port, although an extension of the incision is required due to the solid nature of the cyst. Pelvic cavity is irrigated with aspiration of fluid. Excellent hemostasis is noted at the ovarian site. Gas is expelled and trocars are removed. The rectus fascia at the suprapubic site is closed with a 0 Vicryl suture and then skin is closed with a 4-0 Vicryl suture in a subcuticular fashion at this site and then all three sites have Dermabond used to finish the skin closure.   Speculum is placed and a tenaculum is placed on the cervix. Cervix is gently dilated to a size 20 Pratt dilator and is sounded to 9 cm. A fractional dilatation and curettage procedure is performed with a banjo curette with endometrial curettage sent to pathology for further review. Excellent hemostasis is noted coming from the cervix. The patient then goes to the recovery room in stable condition. All sponge, instrument, and needle counts are correct.   ____________________________ R. Barnett Applebaum, MD rph:drc D: 06/18/2012 15:03:25 ET T: 06/18/2012 15:25:51 ET JOB#: [id number]  cc: Glean Salen, MD, <Dictator> Gae Dry MD ELECTRONICALLY SIGNED 06/19/2012 10:13

## 2015-08-14 ENCOUNTER — Emergency Department
Admission: EM | Admit: 2015-08-14 | Discharge: 2015-08-14 | Disposition: A | Discharge status: Home / Self Care | Payer: BLUE CROSS/BLUE SHIELD | Attending: Emergency Medicine | Admitting: Emergency Medicine

## 2015-08-14 ENCOUNTER — Encounter: Payer: Self-pay | Admitting: Emergency Medicine

## 2015-08-14 DIAGNOSIS — F17210 Nicotine dependence, cigarettes, uncomplicated: Secondary | ICD-10-CM | POA: Insufficient documentation

## 2015-08-14 DIAGNOSIS — H9202 Otalgia, left ear: Secondary | ICD-10-CM | POA: Insufficient documentation

## 2015-08-14 DIAGNOSIS — Z79899 Other long term (current) drug therapy: Secondary | ICD-10-CM | POA: Diagnosis not present

## 2015-08-14 DIAGNOSIS — B349 Viral infection, unspecified: Secondary | ICD-10-CM

## 2015-08-14 DIAGNOSIS — R509 Fever, unspecified: Secondary | ICD-10-CM | POA: Diagnosis present

## 2015-08-14 LAB — POCT RAPID STREP A: Streptococcus, Group A Screen (Direct): NEGATIVE

## 2015-08-14 NOTE — ED Notes (Signed)
Pt to ed with c/o sore throat, congestion, fever and left earache since Thursday.  Denies cough.  Skin warm and dry.

## 2015-08-14 NOTE — ED Provider Notes (Signed)
[facility] Emergency Department Provider Note  ____________________________________________  Time seen: Approximately 9:57 AM  I have reviewed the triage vital signs and the nursing notes.   HISTORY  Chief Complaint Fever; Sore Throat; and Otalgia   HPI Diane Roberts is a 35 y.o. female is here with complaint of sore throat, congestion, left ear pain since Thursday (4 days ago). Patient denies any cough and is unaware of any exposure to strep throat. Fever has been low-grade. She continues to eat and drink without difficulty.She rates her pain is 7 out of 10.   Past Medical History  Diagnosis Date  . Frequent headaches     Patient Active Problem List   Diagnosis Date Noted  . Frequent headaches 09/09/2013    Past Surgical History  Procedure Laterality Date  . Cholecystectomy  2002  . Appendectomy  2012  . Tonsillectomy  2010  . Oophorectomy Right 2013    Current Outpatient Rx  Name  Route  Sig  Dispense  Refill  . ibuprofen (ADVIL,MOTRIN) 200 MG tablet   Oral   Take 800 mg by mouth every 4 (four) hours as needed.         . medroxyPROGESTERone (DEPO-PROVERA) 150 MG/ML injection   Intramuscular   Inject 1 mL (150 mg total) into the muscle every 3 (three) months. Last shot 06/22/2013   1 mL   2     Allergies Review of patient's allergies indicates no known allergies.  Family History  Problem Relation Age of Onset  . Hypertension Mother   . Kidney disease Mother   . Hyperlipidemia Mother     Social History Social History  Substance Use Topics  . Smoking status: Current Every Day Smoker -- 0.50 packs/day for 18 years    Types: Cigarettes  . Smokeless tobacco: Never Used  . Alcohol Use: Yes     Comment: occasional    Review of Systems Constitutional: Positive fever/chills Eyes: No visual changes. ENT: Positive sore throat. Positive left ear pain Cardiovascular: Denies chest pain. Respiratory: Denies shortness of  breath. Positive cough Gastrointestinal: No abdominal pain.  No nausea, no vomiting.  No diarrhea.  No constipation. Genitourinary: Negative for dysuria. Musculoskeletal: Negative for back pain. Skin: Negative for rash. Neurological: Negative for headaches, focal weakness or numbness.  10-point ROS otherwise negative.  ____________________________________________   PHYSICAL EXAM:  VITAL SIGNS: ED Triage Vitals  Enc Vitals Group     BP --      Pulse --      Resp --      Temp --      Temp src --      SpO2 --      Weight --      Height --      Head Cir --      Peak Flow --      Pain Score 08/14/15 0952 7     Pain Loc --      Pain Edu? --      Excl. in [place]? --     Constitutional: Alert and oriented. Well appearing and in no acute distress. Eyes: Conjunctivae are normal. PERRL. EOMI. Head: Atraumatic. Nose: Mild congestion/rhinnorhea. EACs are clear. TMs are dull bilaterally with poor light reflex. Mouth/Throat: Mucous membranes are moist.  Oropharynx non-erythematous. Positive posterior drainage. Neck: No stridor.   Hematological/Lymphatic/Immunilogical: No cervical lymphadenopathy. Cardiovascular: Normal rate, regular rhythm. Grossly normal heart sounds.  Good peripheral circulation. Respiratory: Normal respiratory effort.  No retractions. Lungs CTAB.  Gastrointestinal: Soft and nontender. No distention.  Musculoskeletal: No lower extremity tenderness nor edema.  No joint effusions. Neurologic:  Normal speech and language. No gross focal neurologic deficits are appreciated. No gait instability. Skin:  Skin is warm, dry and intact. No rash noted. Psychiatric: Mood and affect are normal. Speech and behavior are normal.  ____________________________________________   LABS (all labs ordered are listed, but only abnormal results are displayed)  Labs Reviewed  CULTURE, GROUP A STREP ([facility] ONLY)  POCT RAPID STREP A    PROCEDURES  Procedure(s) performed:  None  Critical Care performed: No  ____________________________________________   INITIAL IMPRESSION / ASSESSMENT AND PLAN / ED COURSE  Pertinent labs & imaging results that were available during my care of the patient were reviewed by me and considered in my medical decision making (see chart for details).  Patient was told this most likely was file since her strep test was negative. She is to continue fluids, Tylenol or Motrin as needed for fever and muscle aches. And follow-up with her primary care doctor or [facility] if any continued problems. ____________________________________________   FINAL CLINICAL IMPRESSION(S) / ED DIAGNOSES  Final diagnoses:  Viral illness      Johnn Hai, PA-C 08/14/15 1744  Lavonia Drafts, MD 08/20/15 1227

## 2015-08-14 NOTE — Discharge Instructions (Signed)
Viral Infections A virus is a type of germ. Viruses can cause:  Minor sore throats.  Aches and pains.  Headaches.  Runny nose.  Rashes.  Watery eyes.  Tiredness.  Coughs.  Loss of appetite.  Feeling sick to your stomach (nausea).  Throwing up (vomiting).  Watery poop (diarrhea). HOME CARE   Only take medicines as told by your doctor.  Drink enough water and fluids to keep your pee (urine) clear or pale yellow. Sports drinks are a good choice.  Get plenty of rest and eat healthy. Soups and broths with crackers or rice are fine. GET HELP RIGHT AWAY IF:   You have a very bad headache.  You have shortness of breath.  You have chest pain or neck pain.  You have an unusual rash.  You cannot stop throwing up.  You have watery poop that does not stop.  You cannot keep fluids down.  You or your child has a temperature by mouth above 102 F (38.9 C), not controlled by medicine.  Your baby is older than 3 months with a rectal temperature of 102 F (38.9 C) or higher.  Your baby is 71 months old or younger with a rectal temperature of 100.4 F (38 C) or higher. MAKE SURE YOU:   Understand these instructions.  Will watch this condition.  Will get help right away if you are not doing well or get worse.   This information is not intended to replace advice given to you by your health care provider. Make sure you discuss any questions you have with your health care provider.   Document Released: 07/11/2008 Document Revised: 10/21/2011 Document Reviewed: 01/04/2015 Elsevier Interactive Patient Education 2016 [employer].   Increase fluids. Tylenol or ibuprofen as needed for fever. Continue taking Sudafed for congestion. Follow-up with your doctor if any continued problems in 3-4 days.

## 2015-08-14 NOTE — ED Notes (Signed)
Strep culture sent to lab

## 2015-08-16 LAB — CULTURE, GROUP A STREP (THRC)

## 2015-08-17 NOTE — Progress Notes (Signed)
Patient with negative rapid strep test in ER. Throat cultures grew Group G strep for which pharmacy recommended no treatment and Dr. Reita Cliche agreed.   Diane Roberts, PharmD Clinical Pharmacist

## 2016-02-22 ENCOUNTER — Emergency Department
Admission: EM | Admit: 2016-02-22 | Discharge: 2016-02-22 | Disposition: A | Discharge status: Home / Self Care | Payer: BLUE CROSS/BLUE SHIELD | Attending: Emergency Medicine | Admitting: Emergency Medicine

## 2016-02-22 ENCOUNTER — Emergency Department: Payer: BLUE CROSS/BLUE SHIELD

## 2016-02-22 ENCOUNTER — Encounter: Payer: Self-pay | Admitting: Emergency Medicine

## 2016-02-22 DIAGNOSIS — Y999 Unspecified external cause status: Secondary | ICD-10-CM | POA: Insufficient documentation

## 2016-02-22 DIAGNOSIS — Z791 Long term (current) use of non-steroidal anti-inflammatories (NSAID): Secondary | ICD-10-CM | POA: Diagnosis not present

## 2016-02-22 DIAGNOSIS — Z79899 Other long term (current) drug therapy: Secondary | ICD-10-CM | POA: Insufficient documentation

## 2016-02-22 DIAGNOSIS — M7122 Synovial cyst of popliteal space [Baker], left knee: Secondary | ICD-10-CM | POA: Diagnosis not present

## 2016-02-22 DIAGNOSIS — M25462 Effusion, left knee: Secondary | ICD-10-CM | POA: Insufficient documentation

## 2016-02-22 DIAGNOSIS — M25562 Pain in left knee: Secondary | ICD-10-CM | POA: Diagnosis present

## 2016-02-22 DIAGNOSIS — Y929 Unspecified place or not applicable: Secondary | ICD-10-CM | POA: Insufficient documentation

## 2016-02-22 DIAGNOSIS — X501XXA Overexertion from prolonged static or awkward postures, initial encounter: Secondary | ICD-10-CM | POA: Insufficient documentation

## 2016-02-22 DIAGNOSIS — Y9289 Other specified places as the place of occurrence of the external cause: Secondary | ICD-10-CM | POA: Insufficient documentation

## 2016-02-22 DIAGNOSIS — F17210 Nicotine dependence, cigarettes, uncomplicated: Secondary | ICD-10-CM | POA: Diagnosis not present

## 2016-02-22 MED ORDER — TRAMADOL HCL 50 MG PO TABS: 50.0000 mg | ORAL_TABLET | Freq: Four times a day (QID) | ORAL | Status: AC | PRN

## 2016-02-22 MED ORDER — NAPROXEN 500 MG PO TABS: 500.0000 mg | ORAL_TABLET | Freq: Two times a day (BID) | ORAL | Status: DC

## 2016-02-22 NOTE — ED Provider Notes (Signed)
[facility] Emergency Department Provider Note   ____________________________________________  Time seen: Approximately 11:51 AM  I have reviewed the triage vital signs and the nursing notes.   HISTORY  Chief Complaint Knee Pain    HPI Diane Roberts is a 35 y.o. female patient complaining of left knee pain times one week. Status post flexion incident which occurred at work one week ago. Patient is pain in the popliteal area and swelling to the lateral aspect of the left knee. Patient's complaint refractory to conservative care consisting of elevation and ice. Patient stated pain increases with any flexion motions.She rates the pain as a 6/10. Patient described a pain as "sharp".   Past Medical History  Diagnosis Date  . Frequent headaches     Patient Active Problem List   Diagnosis Date Noted  . Frequent headaches 09/09/2013    Past Surgical History  Procedure Laterality Date  . Cholecystectomy  2002  . Appendectomy  2012  . Tonsillectomy  2010  . Oophorectomy Right 2013    Current Outpatient Rx  Name  Route  Sig  Dispense  Refill  . ibuprofen (ADVIL,MOTRIN) 200 MG tablet   Oral   Take 800 mg by mouth every 4 (four) hours as needed.         . medroxyPROGESTERone (DEPO-PROVERA) 150 MG/ML injection   Intramuscular   Inject 1 mL (150 mg total) into the muscle every 3 (three) months. Last shot 06/22/2013   1 mL   2   . naproxen (NAPROSYN) 500 MG tablet   Oral   Take 1 tablet (500 mg total) by mouth 2 (two) times daily with a meal.   20 tablet   0   . traMADol (ULTRAM) 50 MG tablet   Oral   Take 1 tablet (50 mg total) by mouth every 6 (six) hours as needed.   20 tablet   0     Allergies Review of patient's allergies indicates no known allergies.  Family History  Problem Relation Age of Onset  . Hypertension Mother   . Kidney disease Mother   . Hyperlipidemia Mother     Social History Social History  Substance Use  Topics  . Smoking status: Current Every Day Smoker -- 0.50 packs/day for 18 years    Types: Cigarettes  . Smokeless tobacco: Never Used  . Alcohol Use: Yes     Comment: occasional    Review of Systems Constitutional: No fever/chills Eyes: No visual changes. ENT: No sore throat. Cardiovascular: Denies chest pain. Respiratory: Denies shortness of breath. Gastrointestinal: No abdominal pain.  No nausea, no vomiting.  No diarrhea.  No constipation. Genitourinary: Negative for dysuria. Musculoskeletal: Left knee pain  Skin: Negative for rash. Neurological: Negative for headaches, focal weakness or numbness.   ____________________________________________   PHYSICAL EXAM:  VITAL SIGNS: ED Triage Vitals  Enc Vitals Group     BP --      Pulse --      Resp --      Temp --      Temp src --      SpO2 --      Weight 02/22/16 1148 160 lb (72.576 kg)     Height 02/22/16 1148 5\' 5"  (1.651 m)     Head Cir --      Peak Flow --      Pain Score 02/22/16 1147 6     Pain Loc --      Pain Edu? --  Excl. in [place]? --     Constitutional: Alert and oriented. Well appearing and in no acute distress. Eyes: Conjunctivae are normal. PERRL. EOMI. Head: Atraumatic. Nose: No congestion/rhinnorhea. Mouth/Throat: Mucous membranes are moist.  Oropharynx non-erythematous. Neck: No stridor.  No cervical spine tenderness to palpation. Hematological/Lymphatic/Immunilogical: No cervical lymphadenopathy. Cardiovascular: Normal rate, regular rhythm. Grossly normal heart sounds.  Good peripheral circulation. Respiratory: Normal respiratory effort.  No retractions. Lungs CTAB. Gastrointestinal: Soft and nontender. No distention. No abdominal bruits. No CVA tenderness. Musculoskeletal: No obvious deformity to the left knee. Mild edema to the lateral aspect of the left knee. Patient is some moderate guarding palpation of popliteal fossa.  Neurologic:  Normal speech and language. No gross focal neurologic  deficits are appreciated. No gait instability. Skin:  Skin is warm, dry and intact. No rash noted. Psychiatric: Mood and affect are normal. Speech and behavior are normal.  ____________________________________________   LABS (all labs ordered are listed, but only abnormal results are displayed)  Labs Reviewed - No data to display ____________________________________________  EKG   ____________________________________________  RADIOLOGY  Small knee effusion and Baker's cyst on x-ray. No acute findings. I, Sable Feil, personally viewed and evaluated these images (plain radiographs) as part of my medical decision making, as well as reviewing the written report by the radiologist.  ____________________________________________   PROCEDURES  Procedure(s) performed: None  Procedures  Critical Care performed: No  ____________________________________________   INITIAL IMPRESSION / ASSESSMENT AND PLAN / ED COURSE  Pertinent labs & imaging results that were available during my care of the patient were reviewed by me and considered in my medical decision making (see chart for details).  F knee effusion and Baker's cyst. Discussed x-ray finding with patient. Patient given discharge care instructions. Patient placed in a knee immobilizer. Patient advised follow-up with orthopedics as needed. Patient given a prescription for naproxen and tramadol. ____________________________________________   FINAL CLINICAL IMPRESSION(S) / ED DIAGNOSES  Final diagnoses:  Knee joint effusion, left  Baker's cyst of knee, left      NEW MEDICATIONS STARTED DURING THIS VISIT:  New Prescriptions   NAPROXEN (NAPROSYN) 500 MG TABLET    Take 1 tablet (500 mg total) by mouth 2 (two) times daily with a meal.   TRAMADOL (ULTRAM) 50 MG TABLET    Take 1 tablet (50 mg total) by mouth every 6 (six) hours as needed.     Note:  This document was prepared using Dragon voice recognition software and  may include unintentional dictation errors.    Sable Feil, PA-C 02/22/16 [place], MD 02/22/16 1320

## 2016-02-22 NOTE — ED Notes (Signed)
Presents with pain to left knee   States she bent down at work last Thursday  And developed pain and swelling to lateral aspect of left knee

## 2016-02-22 NOTE — Discharge Instructions (Signed)
Baker Cyst A Baker cyst is a sac-like structure that forms in the back of the knee. It is filled with the same fluid that is located in your knee. This fluid lubricates the bones and cartilage of the knee and allows them to move over each other more easily. CAUSES  When the knee becomes injured or inflamed, increased fluid forms in the knee. When this happens, the joint lining is pushed out behind the knee and forms the Baker cyst. This cyst may also be caused by inflammation from arthritic conditions and infections. SIGNS AND SYMPTOMS  A Baker cyst usually has no symptoms. When the cyst is substantially enlarged:  You may feel pressure behind the knee, stiffness in the knee, or a mass in the area behind the knee.  You may develop pain, redness, and swelling in the calf. This can suggest a blood clot and requires evaluation by your health care provider. DIAGNOSIS  A Baker cyst is most often found during an ultrasound exam. This exam may have been performed for other reasons, and the cyst was found incidentally. Sometimes an MRI is used. This picks up other problems within a joint that an ultrasound exam may not. If the Baker cyst developed immediately after an injury, X-ray exams may be used to diagnose the cyst. TREATMENT  The treatment depends on the cause of the cyst. Anti-inflammatory medicines and rest often will be prescribed. If the cyst is caused by a bacterial infection, antibiotic medicines may be prescribed.  HOME CARE INSTRUCTIONS : Wear knee immobilizer for 3-5 days as needed.  If the cyst was caused by an injury, for the first 24 hours, keep the injured leg elevated on 2 pillows while lying down.  For the first 24 hours while you are awake, apply ice to the injured area:  Put ice in a plastic bag.  Place a towel between your skin and the bag.  Leave the ice on for 20 minutes, 2-3 times a day.  Only take over-the-counter or prescription medicines for pain, discomfort, or fever  as directed by your health care provider.  Only take antibiotic medicine as directed. Make sure to finish it even if you start to feel better. MAKE SURE YOU:   Understand these instructions.  Will watch your condition.  Will get help right away if you are not doing well or get worse.   This information is not intended to replace advice given to you by your health care provider. Make sure you discuss any questions you have with your health care provider.   Document Released: 07/29/2005 Document Revised: 05/19/2013 Document Reviewed: 03/10/2013 Elsevier Interactive Patient Education [employer].

## 2016-08-12 DIAGNOSIS — N63 Unspecified lump in unspecified breast: Secondary | ICD-10-CM

## 2016-08-12 HISTORY — DX: Unspecified lump in unspecified breast: N63.0

## 2016-10-25 ENCOUNTER — Other Ambulatory Visit: Payer: Self-pay | Admitting: Student

## 2016-10-25 DIAGNOSIS — N6320 Unspecified lump in the left breast, unspecified quadrant: Secondary | ICD-10-CM

## 2016-10-25 DIAGNOSIS — N6310 Unspecified lump in the right breast, unspecified quadrant: Secondary | ICD-10-CM

## 2016-11-04 ENCOUNTER — Ambulatory Visit
Admission: RE | Admit: 2016-11-04 | Discharge: 2016-11-04 | Disposition: A | Discharge status: Home / Self Care | Payer: BLUE CROSS/BLUE SHIELD | Source: Ambulatory Visit | Attending: Student | Admitting: Student

## 2016-11-04 DIAGNOSIS — N6310 Unspecified lump in the right breast, unspecified quadrant: Secondary | ICD-10-CM | POA: Diagnosis present

## 2016-11-04 DIAGNOSIS — N6320 Unspecified lump in the left breast, unspecified quadrant: Secondary | ICD-10-CM | POA: Diagnosis present

## 2016-11-04 DIAGNOSIS — N6321 Unspecified lump in the left breast, upper outer quadrant: Secondary | ICD-10-CM | POA: Insufficient documentation

## 2016-11-04 HISTORY — DX: Unspecified lump in unspecified breast: N63.0

## 2016-11-05 ENCOUNTER — Other Ambulatory Visit: Payer: Self-pay | Admitting: Student

## 2016-11-05 DIAGNOSIS — R928 Other abnormal and inconclusive findings on diagnostic imaging of breast: Secondary | ICD-10-CM

## 2016-11-05 DIAGNOSIS — N6320 Unspecified lump in the left breast, unspecified quadrant: Secondary | ICD-10-CM

## 2016-11-21 ENCOUNTER — Ambulatory Visit
Admission: RE | Admit: 2016-11-21 | Discharge: 2016-11-21 | Disposition: A | Discharge status: Home / Self Care | Payer: BLUE CROSS/BLUE SHIELD | Source: Ambulatory Visit | Attending: Student | Admitting: Student

## 2016-11-21 DIAGNOSIS — N6320 Unspecified lump in the left breast, unspecified quadrant: Secondary | ICD-10-CM | POA: Insufficient documentation

## 2016-11-21 DIAGNOSIS — R928 Other abnormal and inconclusive findings on diagnostic imaging of breast: Secondary | ICD-10-CM

## 2016-11-21 HISTORY — PX: BREAST BIOPSY: SHX20

## 2016-11-22 LAB — SURGICAL PATHOLOGY

## 2017-06-29 ENCOUNTER — Emergency Department
Admission: EM | Admit: 2017-06-29 | Discharge: 2017-06-29 | Disposition: A | Discharge status: Home / Self Care | Payer: BLUE CROSS/BLUE SHIELD | Attending: Emergency Medicine | Admitting: Emergency Medicine

## 2017-06-29 ENCOUNTER — Other Ambulatory Visit: Payer: Self-pay

## 2017-06-29 DIAGNOSIS — K6289 Other specified diseases of anus and rectum: Secondary | ICD-10-CM | POA: Diagnosis present

## 2017-06-29 DIAGNOSIS — F17210 Nicotine dependence, cigarettes, uncomplicated: Secondary | ICD-10-CM | POA: Diagnosis not present

## 2017-06-29 DIAGNOSIS — K602 Anal fissure, unspecified: Secondary | ICD-10-CM | POA: Diagnosis not present

## 2017-06-29 DIAGNOSIS — Z79899 Other long term (current) drug therapy: Secondary | ICD-10-CM | POA: Insufficient documentation

## 2017-06-29 DIAGNOSIS — Z791 Long term (current) use of non-steroidal anti-inflammatories (NSAID): Secondary | ICD-10-CM | POA: Insufficient documentation

## 2017-06-29 MED ORDER — LIDOCAINE HCL 2 % EX GEL
1.0000 "application " | Freq: Once | CUTANEOUS | Status: AC
  Administered 2017-06-29: 1 via TOPICAL
  Filled 2017-06-29 (×2): qty 5

## 2017-06-29 MED ORDER — NITROGLYCERIN 0.4 % RE OINT: 1.0000 "application " | TOPICAL_OINTMENT | Freq: Two times a day (BID) | RECTAL | 0 refills | Status: DC

## 2017-06-29 MED ORDER — LIDOCAINE VISCOUS 2 % MT SOLN: 20.0000 mL | OROMUCOSAL | 0 refills | Status: DC | PRN

## 2017-06-29 NOTE — ED Provider Notes (Signed)
[facility] Emergency Department Provider Note  ____________________________________________  Time seen: Approximately 10:10 PM  I have reviewed the triage vital signs and the nursing notes.   HISTORY  Chief Complaint Rectal Pain    HPI Diane Roberts is a 36 y.o. 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 Has a history of anal fissure, but over the last few days. The pain is worse and she's noticed a small amount of bleeding when she has bowel movements as well. Her bowel movements are always liquid and she does not strain. Has about 10 bowel movements a day. No changes in diet. We will abdominal pain. No trauma. Symptoms are intermittent, painful when she is having a bowel movement, no alleviating factors.     Past Medical History:  Diagnosis Date  . Breast mass 2018   bil masses felt by MD  . Frequent headaches      Patient Active Problem List   Diagnosis Date Noted  . Frequent headaches 09/09/2013     Past Surgical History:  Procedure Laterality Date  . APPENDECTOMY  2012  . CHOLECYSTECTOMY  2002  . OOPHORECTOMY Right 2013  . TONSILLECTOMY  2010     Prior to Admission medications   Medication Sig Start Date End Date Taking? Authorizing Provider  ibuprofen (ADVIL,MOTRIN) 200 MG tablet Take 800 mg by mouth every 4 (four) hours as needed.    [provider]  lidocaine (XYLOCAINE) 2 % solution Use as directed 20 mLs every 2 (two) hours as needed in the mouth or throat (anal pain). Gargle and spit out 06/29/17   Carrie Mew, MD  medroxyPROGESTERone (DEPO-PROVERA) 150 MG/ML injection Inject 1 mL (150 mg total) into the muscle every 3 (three) months. Last shot 06/22/2013 12/10/13   Jearld Fenton, NP  naproxen (NAPROSYN) 500 MG tablet Take 1 tablet (500 mg total) by mouth 2 (two) times daily with a meal. 02/22/16   Sable Feil, PA-C   Nitroglycerin 0.4 % OINT Place 1 application 2 (two) times daily rectally. 06/29/17 08/24/17  Carrie Mew, MD     Allergies Patient has no known allergies.   Family History  Problem Relation Age of Onset  . Hypertension Mother   . Kidney disease Mother   . Hyperlipidemia Mother   . Breast cancer Neg Hx     Social History Social History   Tobacco Use  . Smoking status: Current Every Day Smoker    Packs/day: 0.50    Years: 18.00    Pack years: 9.00    Types: Cigarettes  . Smokeless tobacco: Never Used  Substance Use Topics  . Alcohol use: Yes    Comment: occasional  . Drug use: No    Review of Systems  Constitutional:   No fever or chills.  ENT:   No sore throat. No rhinorrhea. Cardiovascular:   No chest pain or syncope. Respiratory:   No dyspnea or cough. Gastrointestinal:   Negative for abdominal pain, vomiting and diarrhea.  Musculoskeletal:   Negative for focal pain or swelling All other systems reviewed and are negative except as documented above in ROS and HPI.  ____________________________________________   PHYSICAL EXAM:  VITAL SIGNS: ED Triage Vitals  Enc Vitals Group     BP 06/29/17 2104 121/75     Pulse Rate 06/29/17 2104 78     Resp 06/29/17 2104 18     Temp 06/29/17  2104 98.2 F (36.8 C)     Temp Source 06/29/17 2104 Oral     SpO2 06/29/17 2104 100 %     Weight 06/29/17 2104 160 lb (72.6 kg)     Height 06/29/17 2104 5\' 5"  (1.651 m)     Head Circumference --      Peak Flow --      Pain Score 06/29/17 2105 9     Pain Loc --      Pain Edu? --      Excl. in [place]? --     Vital signs reviewed, nursing assessments reviewed.   Constitutional:   Alert and oriented. Well appearing and in no distress. Eyes:   No scleral icterus.  EOMI. No nystagmus. No conjunctival pallor. PERRL. ENT   Head:   Normocephalic and atraumatic.   Nose:   No congestion/rhinnorhea.    Mouth/Throat:   MMM, no pharyngeal erythema. No peritonsillar mass.     Neck:   No meningismus. Full ROM. Hematological/Lymphatic/Immunilogical:   No cervical lymphadenopathy. Cardiovascular:   RRR. Symmetric bilateral radial and DP pulses.  No murmurs.  Respiratory:   Normal respiratory effort without tachypnea/retractions. Breath sounds are clear and equal bilaterally. No wheezes/rales/rhonchi. Gastrointestinal:   Soft and nontender. Non distended. There is no CVA tenderness.  No rebound, rigidity, or guarding. Rectal exam performed with nurse Wells Guiles bedside, shows a 1 cm anal fissure with raw mucosa in the right lateral position. Very tender to the touch. No hemorrhoid, no internal swelling. Hemoccult negative, controls okay. Genitourinary:   deferred Musculoskeletal:   Normal range of motion in all extremities. No joint effusions.  No lower extremity tenderness.  No edema. Neurologic:   Normal speech and language.  Motor grossly intact. No gross focal neurologic deficits are appreciated.  Skin:    Skin is warm, dry and intact. No rash noted.  No petechiae, purpura, or bullae.  ____________________________________________    LABS (pertinent positives/negatives) (all labs ordered are listed, but only abnormal results are displayed) Labs Reviewed - No data to display ____________________________________________   EKG    ____________________________________________    RADIOLOGY  No results found.  ____________________________________________   PROCEDURES Procedures  ____________________________________________     CLINICAL IMPRESSION / ASSESSMENT AND PLAN / ED COURSE  Pertinent labs & imaging results that were available during my care of the patient were reviewed by me and considered in my medical decision making (see chart for details).   Patient well appearing no acute distress normal vital signs, presents with anal pain, acute on chronic.Considering the patient's symptoms, medical history, and physical examination today, I  have low suspicion for cholecystitis or biliary pathology, pancreatitis, perforation or bowel obstruction, hernia, intra-abdominal abscess, AAA or dissection, volvulus or intussusception, mesenteric ischemia, or appendicitis.   Symptoms and exam are consistent with anal fissure. Topical lidocaine and nitroglycerin. Sitz baths.      ____________________________________________   FINAL CLINICAL IMPRESSION(S) / ED DIAGNOSES    Final diagnoses:  Anal fissure      This SmartLink is deprecated. Use AVSMEDLIST instead to display the medication list for a patient.   Portions of this note were generated with dragon dictation software. Dictation errors may occur despite best attempts at proofreading.    Carrie Mew, MD 06/29/17 2312

## 2017-06-29 NOTE — ED Triage Notes (Signed)
Pt reports frequent stools due to gallbladder surg years ago, states recent pain for the past week with a possible fissure and pain is worsening in the past couple days

## 2017-06-29 NOTE — ED Notes (Signed)
Called pharmacy to ask for xylocaine jelly. Pt informed on delay for DC.

## 2017-10-13 ENCOUNTER — Other Ambulatory Visit: Payer: Self-pay

## 2017-10-13 ENCOUNTER — Emergency Department
Admission: EM | Admit: 2017-10-13 | Discharge: 2017-10-13 | Disposition: A | Discharge status: Home / Self Care | Payer: BLUE CROSS/BLUE SHIELD | Attending: Emergency Medicine | Admitting: Emergency Medicine

## 2017-10-13 DIAGNOSIS — M7989 Other specified soft tissue disorders: Secondary | ICD-10-CM

## 2017-10-13 DIAGNOSIS — Z79899 Other long term (current) drug therapy: Secondary | ICD-10-CM | POA: Diagnosis not present

## 2017-10-13 DIAGNOSIS — Y929 Unspecified place or not applicable: Secondary | ICD-10-CM | POA: Diagnosis not present

## 2017-10-13 DIAGNOSIS — F17210 Nicotine dependence, cigarettes, uncomplicated: Secondary | ICD-10-CM | POA: Insufficient documentation

## 2017-10-13 DIAGNOSIS — Y998 Other external cause status: Secondary | ICD-10-CM | POA: Diagnosis not present

## 2017-10-13 DIAGNOSIS — Z9049 Acquired absence of other specified parts of digestive tract: Secondary | ICD-10-CM | POA: Diagnosis not present

## 2017-10-13 DIAGNOSIS — Y9389 Activity, other specified: Secondary | ICD-10-CM | POA: Diagnosis not present

## 2017-10-13 DIAGNOSIS — T63481A Toxic effect of venom of other arthropod, accidental (unintentional), initial encounter: Secondary | ICD-10-CM

## 2017-10-13 DIAGNOSIS — R2232 Localized swelling, mass and lump, left upper limb: Secondary | ICD-10-CM | POA: Diagnosis not present

## 2017-10-13 LAB — COMPREHENSIVE METABOLIC PANEL
ALT: 13 U/L — ABNORMAL LOW (ref 14–54)
AST: 19 U/L (ref 15–41)
Albumin: 4 g/dL (ref 3.5–5.0)
Alkaline Phosphatase: 51 U/L (ref 38–126)
Anion gap: 7 (ref 5–15)
BUN: 11 mg/dL (ref 6–20)
CO2: 25 mmol/L (ref 22–32)
Calcium: 8.4 mg/dL — ABNORMAL LOW (ref 8.9–10.3)
Chloride: 105 mmol/L (ref 101–111)
Creatinine, Ser: 0.84 mg/dL (ref 0.44–1.00)
GFR calc Af Amer: >60 mL/min (ref >60)
GFR calc non Af Amer: >60 mL/min (ref >60)
Glucose, Bld: 121 mg/dL — ABNORMAL HIGH (ref 65–99)
Potassium: 3.7 mmol/L (ref 3.5–5.1)
Sodium: 137 mmol/L (ref 135–145)
Total Bilirubin: 1.3 mg/dL — ABNORMAL HIGH (ref 0.3–1.2)
Total Protein: 7 g/dL (ref 6.5–8.1)

## 2017-10-13 LAB — LACTIC ACID, PLASMA: Lactic Acid, Venous: 1.5 mmol/L (ref 0.5–1.9)

## 2017-10-13 LAB — CBC WITH DIFFERENTIAL/PLATELET
Basophils Absolute: 0.1 10*3/uL (ref 0–0.1)
Basophils Relative: 1 %
Eosinophils Absolute: 0.1 10*3/uL (ref 0–0.7)
Eosinophils Relative: 2 %
HCT: 40.7 % (ref 35.0–47.0)
Hemoglobin: 14 g/dL (ref 12.0–16.0)
Lymphocytes Relative: 44 %
Lymphs Abs: 2.1 10*3/uL (ref 1.0–3.6)
MCH: 31.2 pg (ref 26.0–34.0)
MCHC: 34.5 g/dL (ref 32.0–36.0)
MCV: 90.3 fL (ref 80.0–100.0)
Monocytes Absolute: 0.4 10*3/uL (ref 0.2–0.9)
Monocytes Relative: 9 %
Neutro Abs: 2.2 10*3/uL (ref 1.4–6.5)
Neutrophils Relative %: 44 %
Platelets: 157 10*3/uL (ref 150–440)
RBC: 4.5 MIL/uL (ref 3.80–5.20)
RDW: 13.3 % (ref 11.5–14.5)
WBC: 4.8 10*3/uL (ref 3.6–11.0)

## 2017-10-13 MED ORDER — PREDNISONE 20 MG PO TABS: 60.0000 mg | ORAL_TABLET | Freq: Every day | ORAL | 0 refills | Status: DC

## 2017-10-13 MED ORDER — DIPHENHYDRAMINE HCL 50 MG/ML IJ SOLN
25.0000 mg | INTRAMUSCULAR | Status: AC
  Administered 2017-10-13: 25 mg via INTRAVENOUS
  Filled 2017-10-13: qty 1

## 2017-10-13 MED ORDER — CEPHALEXIN 500 MG PO CAPS
500.0000 mg | ORAL_CAPSULE | Freq: Once | ORAL | Status: AC
  Administered 2017-10-13: 500 mg via ORAL
  Filled 2017-10-13: qty 1

## 2017-10-13 MED ORDER — METHYLPREDNISOLONE SODIUM SUCC 125 MG IJ SOLR
125.0000 mg | Freq: Once | INTRAMUSCULAR | Status: AC
Start: 2017-10-13 — End: 2017-10-13
  Administered 2017-10-13: 125 mg via INTRAVENOUS
  Filled 2017-10-13: qty 2

## 2017-10-13 MED ORDER — CEPHALEXIN 500 MG PO CAPS: 500.0000 mg | ORAL_CAPSULE | Freq: Three times a day (TID) | ORAL | 0 refills | Status: DC

## 2017-10-13 MED ORDER — FAMOTIDINE IN NACL 20-0.9 MG/50ML-% IV SOLN
20.0000 mg | Freq: Once | INTRAVENOUS | Status: AC
  Administered 2017-10-13: 20 mg via INTRAVENOUS
  Filled 2017-10-13: qty 50

## 2017-10-13 NOTE — ED Triage Notes (Addendum)
Pt with redness and swelling with red streaks noted to left arm. Pt has two puncture wounds noted to medial left wrist and bruising noted to mid left finger. Pt states arm is "tight" pt is able to move fingers. Pt states arm pain extends up to chest.

## 2017-10-13 NOTE — ED Notes (Signed)
Patient reports feeling some better.

## 2017-10-13 NOTE — ED Provider Notes (Signed)
[facility] Emergency Department Provider Note  ____________________________________________   First MD Initiated Contact with Patient 10/13/17 [phone]     (approximate)  I have reviewed the triage vital signs and the nursing notes.   HISTORY  Chief Complaint Arm Swelling    HPI Diane Roberts is a 37 y.o. female with no contributory past medical history who presents for evaluation of acute onset swelling and redness in her left hand and arm.  She says that the symptoms started acutely about 12 hours ago.  She went out with a friend and stayed somewhere new the night before and noticed that she had a couple of bites on her arm she appeared to be some kind of insect.  The areas were itching and burning and stinging a little bit.  Then she went to sleep tonight but woke up with more extensive redness and swelling in her hand and extending up her left forearm.  Nothing in particular has made it better or worse but his worsened acutely.  Her distal forearm and wrist have areas of splotchy redness with swelling, mild tenderness, and on the forearm there are multiple central foci that do appear to be insect bites.  The lesions are somewhat painful but also pruritic.  No numbness nor tingling in her hands or fingers but she does feel like they are tight.  Past Medical History:  Diagnosis Date  . Breast mass 2018   bil masses felt by MD  . Frequent headaches     Patient Active Problem List   Diagnosis Date Noted  . Frequent headaches 09/09/2013    Past Surgical History:  Procedure Laterality Date  . APPENDECTOMY  2012  . CHOLECYSTECTOMY  2002  . OOPHORECTOMY Right 2013  . TONSILLECTOMY  2010    Prior to Admission medications   Medication Sig Start Date End Date Taking? Authorizing Provider  cephALEXin (KEFLEX) 500 MG capsule Take 1 capsule (500 mg total) by mouth 3 (three) times daily. 10/13/17   Hinda Kehr, MD  ibuprofen (ADVIL,MOTRIN) 200 MG tablet Take  800 mg by mouth every 4 (four) hours as needed.    [provider]  lidocaine (XYLOCAINE) 2 % solution Use as directed 20 mLs every 2 (two) hours as needed in the mouth or throat (anal pain). Gargle and spit out 06/29/17   Carrie Mew, MD  medroxyPROGESTERone (DEPO-PROVERA) 150 MG/ML injection Inject 1 mL (150 mg total) into the muscle every 3 (three) months. Last shot 06/22/2013 12/10/13   Jearld Fenton, NP  naproxen (NAPROSYN) 500 MG tablet Take 1 tablet (500 mg total) by mouth 2 (two) times daily with a meal. 02/22/16   Sable Feil, PA-C  Nitroglycerin 0.4 % OINT Place 1 application 2 (two) times daily rectally. 06/29/17 08/24/17  Carrie Mew, MD  predniSONE (DELTASONE) 20 MG tablet Take 3 tablets (60 mg total) by mouth daily. 10/13/17   Hinda Kehr, MD    Allergies Patient has no known allergies.  Family History  Problem Relation Age of Onset  . Hypertension Mother   . Kidney disease Mother   . Hyperlipidemia Mother   . Breast cancer Neg Hx     Social History Social History   Tobacco Use  . Smoking status: Current Every Day Smoker    Packs/day: 0.50    Years: 18.00    Pack years: 9.00    Types: Cigarettes  . Smokeless tobacco: Never Used  Substance Use Topics  . Alcohol use: Yes  Comment: occasional  . Drug use: No    Review of Systems Constitutional: No fever/chills Eyes: No visual changes. ENT: No sore throat. Cardiovascular: Denies chest pain. Respiratory: Denies shortness of breath. Gastrointestinal: No abdominal pain.  No nausea, no vomiting.  No diarrhea.  No constipation. Genitourinary: Negative for dysuria. Musculoskeletal: Swelling of left hand and distal forearm as described above Integumentary: She is of redness and swelling in left hand and forearm with a central area that appears to be from an insect bite or sting Neurological: Negative for headaches, focal weakness or  numbness.  ____________________________________________   PHYSICAL EXAM:  VITAL SIGNS: ED Triage Vitals  Enc Vitals Group     BP 10/13/17 0234 121/73     Pulse Rate 10/13/17 0234 60     Resp 10/13/17 0234 16     Temp 10/13/17 0234 98.3 F (36.8 C)     Temp Source 10/13/17 0234 Oral     SpO2 10/13/17 0234 100 %     Weight 10/13/17 0235 78.5 kg (173 lb)     Height 10/13/17 0235 1.626 m (5\' 4" )     Head Circumference --      Peak Flow --      Pain Score 10/13/17 0235 7     Pain Loc --      Pain Edu? --      Excl. in [place]? --     Constitutional: Alert and oriented. Well appearing and in no acute distress. Eyes: Conjunctivae are normal.  Head: Atraumatic. Nose: No congestion/rhinnorhea. Mouth/Throat: Mucous membranes are moist.  No mucosal involvement. Neck: No stridor.  No meningeal signs.   Cardiovascular: Normal rate, regular rhythm. Good peripheral circulation. Grossly normal heart sounds. Respiratory: Normal respiratory effort.  No retractions. Lungs CTAB. Gastrointestinal: Soft and nontender. No distention.  Neurologic:  Normal speech and language. No gross focal neurologic deficits are appreciated.  Psychiatric: Mood and affect are normal. Speech and behavior are normal. Musculoskeletal: Edema of the left wrist and hand. Skin: Patchy circular erythema with central insect stings.  She has a lesion on the ulnar side of her left middle finger that appears pupuric and is palpable with two separate "lumps" that appear to be bites, but look different that than the other ones.  See photo below:      ____________________________________________   LABS (all labs ordered are listed, but only abnormal results are displayed)  Labs Reviewed  COMPREHENSIVE METABOLIC PANEL - Abnormal; Notable for the following components:      Result Value   Glucose, Bld 121 (*)    Calcium 8.4 (*)    ALT 13 (*)    Total Bilirubin 1.3 (*)    All other components within normal limits  LACTIC  ACID, PLASMA  CBC WITH DIFFERENTIAL/PLATELET  LACTIC ACID, PLASMA  POC URINE PREG, ED   ____________________________________________  EKG  ED ECG REPORT I, Hinda Kehr, the attending physician, personally viewed and interpreted this ECG.  Date: 10/13/2017 EKG Time: 02:31 Rate: 67 Rhythm: normal sinus rhythm QRS Axis: normal Intervals: normal ST/T Wave abnormalities: normal Narrative Interpretation: no evidence of acute ischemia  ____________________________________________  RADIOLOGY   ED MD interpretation: No imaging indicated  Official radiology report(s): No results found.  ____________________________________________   PROCEDURES  Critical Care performed: No   Procedure(s) performed:   Procedures   ____________________________________________   INITIAL IMPRESSION / ASSESSMENT AND PLAN / ED COURSE  As part of my medical decision making, I reviewed the following data within the electronic  MEDICAL RECORD NUMBER Nursing notes reviewed and incorporated    Differential diagnosis includes, but is not limited to, allergic reaction from insect stings or bites, acute cellulitis possibly from the same reason (insect stings), coagulation abnormality leading to palpable purpura, other acute infectious process such as necrotizing fasciitis.  However based on the fact that the symptoms all came up within about 12 hours and that she is in no acute distress with normal vital signs and has highly pruritic lesions, I believe that she is having an acute allergic reaction to the stings.  I do not have a solid explanation for the palpable purpuric lesion on her finger but I do believe it is from the same source.  There is no evidence of snakebite or spider bite.  I will treat with Solu-Medrol, famotidine, Benadryl, and I will give empiric Keflex just in case this represents a developing cellulitis, but I believe it is much more likely to be a hypersensitivity response.  She is having  no signs of anaphylaxis, is comfortable, and is in agreement with the plan.  Clinical Course as of Oct 14 618  Mon Oct 13, 2017  2774 I checked on the patient and she is feeling better.  Her hand and arm have visibly decreased in size.  She said that is still feels tight and swollen but better than before.  Is comfortable with plan for discharge and outpatient follow-up.  I encourage the use of over-the-counter cetirizine as well as the prescriptions for Keflex and prednisone that I will provide.  She will call her PCP to schedule a follow-up appointment.  I gave my usual and customary return precautions.   [CF]    Clinical Course User Index [CF] Hinda Kehr, MD    ____________________________________________  FINAL CLINICAL IMPRESSION(S) / ED DIAGNOSES  Final diagnoses:  Left arm swelling  Allergic reaction to insect sting, accidental or unintentional, initial encounter     MEDICATIONS GIVEN DURING THIS VISIT:  Medications  diphenhydrAMINE (BENADRYL) injection 25 mg (25 mg Intravenous Given 10/13/17 0332)  methylPREDNISolone sodium succinate (SOLU-MEDROL) 125 mg/2 mL injection 125 mg (125 mg Intravenous Given 10/13/17 0330)  famotidine (PEPCID) IVPB 20 mg premix (0 mg Intravenous Stopped 10/13/17 0406)  cephALEXin (KEFLEX) capsule 500 mg (500 mg Oral Given 10/13/17 0335)     ED Discharge Orders        Ordered    predniSONE (DELTASONE) 20 MG tablet  Daily     10/13/17 0536    cephALEXin (KEFLEX) 500 MG capsule  3 times daily     10/13/17 0536       Note:  This document was prepared using Dragon voice recognition software and may include unintentional dictation errors.    Hinda Kehr, MD 10/13/17 [phone]

## 2017-10-13 NOTE — Discharge Instructions (Signed)
As we discussed, we are not absolutely certain what caused your reaction but it does appear that you were bitten by one or more insects.  I think it is more likely that you are having an allergic reaction to the bites/stings then that you have rapidly developed an infection.  However, just to be safe, we have prescribed some antibiotics (cephalexin) as well as some steroids to help with the inflammation and allergic reaction.  Please take the full course of both medications.  We also recommend that you take cetirizine (Zyrtec) which is an over-the-counter allergy medication that you can take without it making you sleepy.  Take that according to label instructions.  Follow-up with your doctor within a couple of days and return to the emergency department if you develop new or worsening symptoms that concern you.

## 2017-10-13 NOTE — ED Notes (Signed)
Report to dawn, rn.  

## 2017-10-13 NOTE — ED Notes (Signed)
Patient reports noticed area to left middle finger and to left wrist earlier on Sunday.  Reports area was red and itching.  Reports over course of day area became more red with redness extending up her arm and swelling.  Patient reports continues to itch.  Dr Karma Greaser had already been to bedside.

## 2017-12-26 ENCOUNTER — Ambulatory Visit
Admission: EM | Admit: 2017-12-26 | Discharge: 2017-12-26 | Disposition: A | Discharge status: Home / Self Care | Payer: BLUE CROSS/BLUE SHIELD | Attending: Family Medicine | Admitting: Family Medicine

## 2017-12-26 ENCOUNTER — Other Ambulatory Visit: Payer: Self-pay

## 2017-12-26 ENCOUNTER — Encounter: Payer: Self-pay | Admitting: Emergency Medicine

## 2017-12-26 DIAGNOSIS — B9789 Other viral agents as the cause of diseases classified elsewhere: Secondary | ICD-10-CM

## 2017-12-26 DIAGNOSIS — B001 Herpesviral vesicular dermatitis: Secondary | ICD-10-CM | POA: Diagnosis not present

## 2017-12-26 DIAGNOSIS — J029 Acute pharyngitis, unspecified: Secondary | ICD-10-CM

## 2017-12-26 LAB — RAPID STREP SCREEN (MED CTR MEBANE ONLY): Streptococcus, Group A Screen (Direct): NEGATIVE

## 2017-12-26 MED ORDER — LIDOCAINE VISCOUS HCL 2 % MT SOLN: OROMUCOSAL | 0 refills | Status: DC

## 2017-12-26 MED ORDER — SULFURIC ACID-SULF PHENOLICS 30-50 % MT SOLN: OROMUCOSAL | 1 refills | Status: DC

## 2017-12-26 NOTE — ED Provider Notes (Signed)
[facility]    CSN: [id number] Arrival date & time: 12/26/17  1846  History   Chief Complaint Chief Complaint  Patient presents with  . Sore Throat   HPI  37 year old female presents with sore throat.  Patient reports that she recently developed a canker sore of the lower lip.  She is been feeling fatigued.  She has been stressed and working a lot.  She states that today she developed severe sore throat.  She describes it as a stabbing pain.  Worse with swallowing.  No known relieving factors.  No medications or interventions tried.  No other associated symptoms.  No other complaints.  Past Medical History:  Diagnosis Date  . Breast mass 2018   bil masses felt by MD  . Frequent headaches    Patient Active Problem List   Diagnosis Date Noted  . Frequent headaches 09/09/2013   Past Surgical History:  Procedure Laterality Date  . APPENDECTOMY  2012  . CHOLECYSTECTOMY  2002  . OOPHORECTOMY Right 2013  . TONSILLECTOMY  2010   OB History    Gravida  2   Para      Term      Preterm      AB      Living        SAB      TAB      Ectopic      Multiple      Live Births             Home Medications    Prior to Admission medications   Medication Sig Start Date End Date Taking? Authorizing Provider  ibuprofen (ADVIL,MOTRIN) 200 MG tablet Take 800 mg by mouth every 4 (four) hours as needed.   Yes [provider]  naproxen (NAPROSYN) 500 MG tablet Take 1 tablet (500 mg total) by mouth 2 (two) times daily with a meal. 02/22/16  Yes Sable Feil, PA-C  lidocaine (XYLOCAINE) 2 % solution Gargle 15 mL every 3 hours as needed. May swallow. 12/26/17   Coral Spikes, DO  Sulfuric Acid-Sulf Phenolics 10-62 % SOLN Use as directed for canker sore. 12/26/17   Coral Spikes, DO    Family History Family History  Problem Relation Age of Onset  . Hypertension Mother   . Kidney disease Mother   . Hyperlipidemia Mother   . Breast cancer Neg Hx      Social History Social History   Tobacco Use  . Smoking status: Former Smoker    Packs/day: 0.50    Years: 18.00    Pack years: 9.00    Types: Cigarettes  . Smokeless tobacco: Never Used  Substance Use Topics  . Alcohol use: Yes    Comment: occasional  . Drug use: No     Allergies   Patient has no known allergies.   Review of Systems Review of Systems  Constitutional: Positive for fatigue.  HENT: Positive for mouth sores and sore throat.    Physical Exam Triage Vital Signs ED Triage Vitals  Enc Vitals Group     BP 12/26/17 1855 109/67     Pulse Rate 12/26/17 1855 (!) 59     Resp 12/26/17 1855 16     Temp 12/26/17 1855 98.4 F (36.9 C)     Temp Source 12/26/17 1855 Oral     SpO2 12/26/17 1855 100 %     Weight 12/26/17 1856 170 lb (77.1 kg)     Height 12/26/17 1856 5'  4" (1.626 m)     Head Circumference --      Peak Flow --      Pain Score 12/26/17 1856 7     Pain Loc --      Pain Edu? --      Excl. in [place]? --    Updated Vital Signs BP 109/67 (BP Location: Left Arm)   Pulse (!) 59   Temp 98.4 F (36.9 C) (Oral)   Resp 16   Ht 5\' 4"  (1.626 m)   Wt 170 lb (77.1 kg)   SpO2 100%   BMI 29.18 kg/m   Physical Exam  Constitutional: She is oriented to person, place, and time. She appears well-developed. No distress.  HENT:  Canker sore noted on the right lower lip.  Oropharynx with mild erythema.  She has an ulceration of the soft palate on the left.  Cardiovascular: Normal rate and regular rhythm.  Pulmonary/Chest: Effort normal and breath sounds normal. She has no wheezes. She has no rales.  Neurological: She is alert and oriented to person, place, and time.  Psychiatric: She has a normal mood and affect. Her behavior is normal.  Nursing note and vitals reviewed.  UC Treatments / Results  Labs (all labs ordered are listed, but only abnormal results are displayed) Labs Reviewed  RAPID STREP SCREEN ([facility] ONLY)  CULTURE, GROUP A STREP [facility])     EKG None  Radiology No results found.  Procedures Procedures (including critical care time)  Medications Ordered in UC Medications - No data to display  Initial Impression / Assessment and Plan / UC Course  I have reviewed the triage vital signs and the nursing notes.  Pertinent labs & imaging results that were available during my care of the patient were reviewed by me and considered in my medical decision making (see chart for details).    37 year old female presents with viral pharyngitis.  Treating with viscous lidocaine.  Debacterol for her all for her canker sore.  Final Clinical Impressions(s) / UC Diagnoses   Final diagnoses:  Viral pharyngitis     Discharge Instructions     Medication as directed.  Ibuprofen as needed.  Take care  Dr. Lacinda Axon    ED Prescriptions    Medication Sig Dispense Auth. Provider   lidocaine (XYLOCAINE) 2 % solution Gargle 15 mL every 3 hours as needed. May swallow. 100 mL Ruperto Kiernan G, DO   Sulfuric Acid-Sulf Phenolics 67-67 % SOLN Use as directed for canker sore. 2 each Coral Spikes, DO     Controlled Substance Prescriptions [place] Controlled Substance Registry consulted? Not Applicable   Coral Spikes, DO 12/26/17 1939

## 2017-12-26 NOTE — ED Triage Notes (Signed)
Patient states she is feeling fatigued and has a very sore throat.

## 2017-12-26 NOTE — Discharge Instructions (Signed)
Medication as directed.  Ibuprofen as needed.  Take care  Dr. Lacinda Axon

## 2017-12-28 ENCOUNTER — Emergency Department
Admission: EM | Admit: 2017-12-28 | Discharge: 2017-12-28 | Disposition: A | Discharge status: Home / Self Care | Payer: BLUE CROSS/BLUE SHIELD | Attending: Emergency Medicine | Admitting: Emergency Medicine

## 2017-12-28 ENCOUNTER — Other Ambulatory Visit: Payer: Self-pay

## 2017-12-28 DIAGNOSIS — K1370 Unspecified lesions of oral mucosa: Secondary | ICD-10-CM | POA: Insufficient documentation

## 2017-12-28 DIAGNOSIS — Z87891 Personal history of nicotine dependence: Secondary | ICD-10-CM | POA: Diagnosis not present

## 2017-12-28 DIAGNOSIS — K121 Other forms of stomatitis: Secondary | ICD-10-CM | POA: Diagnosis not present

## 2017-12-28 MED ORDER — TRIAMCINOLONE ACETONIDE 0.1 % MT PSTE: 1.0000 "application " | PASTE | Freq: Two times a day (BID) | OROMUCOSAL | 1 refills | Status: DC

## 2017-12-28 MED ORDER — NAPROXEN 500 MG PO TABS: 500.0000 mg | ORAL_TABLET | Freq: Two times a day (BID) | ORAL | 0 refills | Status: DC

## 2017-12-28 MED ORDER — CHLORHEXIDINE GLUCONATE 0.12 % MT SOLN
15.0000 mL | Freq: Two times a day (BID) | OROMUCOSAL | 0 refills | Status: DC
Start: 2017-12-28 — End: 2018-04-21

## 2017-12-28 NOTE — ED Triage Notes (Signed)
Pt states that she has been switiching shifts lately and not sleeping well, pt states that she started with an ulcer on her lip, now has a couple on the back of her throat, pt states that her gums are swollen and will bleed.

## 2017-12-28 NOTE — ED Provider Notes (Signed)
[facility] Emergency Department Provider Note  ____________________________________________  Time seen: Approximately 7:38 PM  I have reviewed the triage vital signs and the nursing notes.   HISTORY  Chief Complaint Mouth Lesions   HPI Diane Roberts is a 37 y.o. female who presents to the emergency department for treatment and evaluation of ulcers on her lip and mouth.  She states that this happens when she becomes stressed or overly tired.  She states that she has had some changes in her work schedule and some other stressful things in her life as of late.  She has attempted to use Orajel without much relief.  Initial ulcer was on the right side of the lower lip 1 week ago and has not improved much.  Ulcerations seem to be spreading and now she has one in the back of her throat and on her upper lip.  She states when she brushes her teeth her gums bleed in those areas.  She was evaluated by urgent care and given viscous lidocaine, however has been unable to gargle it do to the consistency and taste.  She has not taken any other medications for current complaint.  Past Medical History:  Diagnosis Date  . Breast mass 2018   bil masses felt by MD  . Frequent headaches     Patient Active Problem List   Diagnosis Date Noted  . Frequent headaches 09/09/2013    Past Surgical History:  Procedure Laterality Date  . APPENDECTOMY  2012  . CHOLECYSTECTOMY  2002  . OOPHORECTOMY Right 2013  . TONSILLECTOMY  2010    Prior to Admission medications   Medication Sig Start Date End Date Taking? Authorizing Provider  chlorhexidine (PERIDEX) 0.12 % solution Use as directed 15 mLs in the mouth or throat 2 (two) times daily. 12/28/17   Chesky Heyer B, FNP  ibuprofen (ADVIL,MOTRIN) 200 MG tablet Take 800 mg by mouth every 4 (four) hours as needed.    [provider]  lidocaine (XYLOCAINE) 2 % solution Gargle 15 mL every 3 hours as needed. May swallow. 12/26/17    Coral Spikes, DO  naproxen (NAPROSYN) 500 MG tablet Take 1 tablet (500 mg total) by mouth 2 (two) times daily with a meal. 12/28/17   Skylur Fuston B, FNP  Sulfuric Acid-Sulf Phenolics 33-82 % SOLN Use as directed for canker sore. 12/26/17   Coral Spikes, DO  triamcinolone (KENALOG) 0.1 % paste Use as directed 1 application in the mouth or throat 2 (two) times daily. 12/28/17   Victorino Dike, FNP    Allergies Patient has no known allergies.  Family History  Problem Relation Age of Onset  . Hypertension Mother   . Kidney disease Mother   . Hyperlipidemia Mother   . Breast cancer Neg Hx     Social History Social History   Tobacco Use  . Smoking status: Former Smoker    Packs/day: 0.50    Years: 18.00    Pack years: 9.00    Types: Cigarettes  . Smokeless tobacco: Never Used  Substance Use Topics  . Alcohol use: Yes    Comment: occasional  . Drug use: No    Review of Systems  Constitutional: Negative for fever. Respiratory: Negative for cough or shortness of breath.  Musculoskeletal: Negative for myalgias Skin: Positive for oral lesions. Neurological: Negative for numbness or paresthesias. ____________________________________________   PHYSICAL EXAM:  VITAL SIGNS: ED Triage Vitals  Enc Vitals Group     BP 12/28/17  1813 118/74     Pulse Rate 12/28/17 1813 65     Resp 12/28/17 1813 16     Temp 12/28/17 1813 98.5 F (36.9 C)     Temp Source 12/28/17 1813 Oral     SpO2 12/28/17 1813 100 %     Weight 12/28/17 1814 170 lb (77.1 kg)     Height 12/28/17 1814 5\' 4"  (1.626 m)     Head Circumference --      Peak Flow --      Pain Score 12/28/17 1814 7     Pain Loc --      Pain Edu? --      Excl. in [place]? --      Constitutional: Well appearing. Eyes: Conjunctivae are clear without discharge or drainage. Nose: No rhinorrhea noted. Mouth/Throat: Airway is patent.  Neck: No stridor. Unrestricted range of motion observed. Lymphatic: No cervical adenopathy on  exam Cardiovascular: Capillary refill is <3 seconds.  Respiratory: Respirations are even and unlabored.. Musculoskeletal: Unrestricted range of motion observed. Neurologic: Awake, alert, and oriented x 4.  Skin: Scattered ulcerations of the right side lower lip, buccal fold on the left upper gum, and a single lesion on the posterior oropharynx.  ____________________________________________   LABS (all labs ordered are listed, but only abnormal results are displayed)  Labs Reviewed - No data to display ____________________________________________  EKG  Not indicated. ____________________________________________  RADIOLOGY  Not indicated ____________________________________________   PROCEDURES  Procedures ____________________________________________   INITIAL IMPRESSION / ASSESSMENT AND PLAN / ED COURSE  Diane Roberts is a 37 y.o. female Who presents to the emergency department for treatment and evaluation of oral ulcers. She will be treated with triamcinolone paste, peridex, and encouraged to use the viscous lidocaine by swish and spit if she can't swallow it. She was advised to see the dentist if not improving over the week. She was advised to return to the ER for symptoms that change or worsen or for new concerns if unable to schedule an appointment.   Medications - No data to display  Pertinent labs & imaging results that were available during my care of the patient were reviewed by me and considered in my medical decision making (see chart for details). ____________________________________________   FINAL CLINICAL IMPRESSION(S) / ED DIAGNOSES  Final diagnoses:  Stomatitis, ulcerative    ED Discharge Orders        Ordered    triamcinolone (KENALOG) 0.1 % paste  2 times daily     12/28/17 1837    chlorhexidine (PERIDEX) 0.12 % solution  2 times daily     12/28/17 1837    naproxen (NAPROSYN) 500 MG tablet  2 times daily with meals     12/28/17 1837        Note:  This document was prepared using Dragon voice recognition software and may include unintentional dictation errors.    Victorino Dike, FNP 12/28/17 2035    Nance Pear, MD 12/28/17 2051

## 2017-12-29 LAB — CULTURE, GROUP A STREP (THRC)

## 2018-04-08 ENCOUNTER — Other Ambulatory Visit: Payer: Self-pay

## 2018-04-08 ENCOUNTER — Encounter: Payer: Self-pay | Admitting: Emergency Medicine

## 2018-04-08 ENCOUNTER — Ambulatory Visit
Admission: EM | Admit: 2018-04-08 | Discharge: 2018-04-08 | Disposition: A | Discharge status: Home / Self Care | Payer: BLUE CROSS/BLUE SHIELD | Attending: Family Medicine | Admitting: Family Medicine

## 2018-04-08 DIAGNOSIS — N3001 Acute cystitis with hematuria: Secondary | ICD-10-CM

## 2018-04-08 LAB — URINALYSIS, COMPLETE (UACMP) WITH MICROSCOPIC
Bacteria, UA: NONE SEEN
Bilirubin Urine: NEGATIVE
Glucose, UA: NEGATIVE mg/dL
Leukocytes, UA: NEGATIVE
Nitrite: POSITIVE — AB
Protein, ur: NEGATIVE mg/dL
Specific Gravity, Urine: 1.02 (ref 1.005–1.030)
Squamous Epithelial / LPF: NONE SEEN (ref 0–5)
pH: 6 (ref 5.0–8.0)

## 2018-04-08 MED ORDER — CEPHALEXIN 500 MG PO CAPS: 500.0000 mg | ORAL_CAPSULE | Freq: Two times a day (BID) | ORAL | 0 refills | Status: DC

## 2018-04-08 NOTE — Discharge Instructions (Addendum)
Lots of water

## 2018-04-08 NOTE — ED Triage Notes (Addendum)
Patient in today c/o urinary urgency, urinary frequency and pressure since today. Patient denies fever.

## 2018-04-08 NOTE — ED Provider Notes (Signed)
[facility]    CSN: [id number] Arrival date & time: 04/08/18  1742     History   Chief Complaint Chief Complaint  Patient presents with  . Urinary Urgency    HPI Diane Roberts is a 37 y.o. female.   The history is provided by the patient.  Dysuria  Pain quality:  Aching Pain severity:  Mild Onset quality:  Sudden Duration:  1 day Timing:  Constant Progression:  Worsening Chronicity:  New Recent urinary tract infections: no   Relieved by:  Nothing Ineffective treatments:  Phenazopyridine Urinary symptoms: frequent urination   Urinary symptoms: no discolored urine, no foul-smelling urine, no hematuria, no hesitancy and no bladder incontinence   Associated symptoms: no abdominal pain, no fever, no flank pain, no genital lesions, no nausea, no vaginal discharge and no vomiting   Risk factors: no hx of pyelonephritis, no hx of urolithiasis, no kidney transplant, not pregnant, no recurrent urinary tract infections, no renal cysts, no renal disease, no single kidney and no urinary catheter     Past Medical History:  Diagnosis Date  . Breast mass 2018   bil masses felt by MD  . Frequent headaches     Patient Active Problem List   Diagnosis Date Noted  . Frequent headaches 09/09/2013    Past Surgical History:  Procedure Laterality Date  . APPENDECTOMY  2012  . CHOLECYSTECTOMY  2002  . OOPHORECTOMY Right 2013  . TONSILLECTOMY  2010    OB History    Gravida  2   Para      Term      Preterm      AB      Living        SAB      TAB      Ectopic      Multiple      Live Births               Home Medications    Prior to Admission medications   Medication Sig Start Date End Date Taking? Authorizing Provider  cephALEXin (KEFLEX) 500 MG capsule Take 1 capsule (500 mg total) by mouth 2 (two) times daily. 04/08/18   Norval Gable, MD  chlorhexidine (PERIDEX) 0.12 % solution Use as directed 15 mLs in the mouth or throat 2 (two) times  daily. 12/28/17   Triplett, Cari B, FNP  ibuprofen (ADVIL,MOTRIN) 200 MG tablet Take 800 mg by mouth every 4 (four) hours as needed.    [provider]  lidocaine (XYLOCAINE) 2 % solution Gargle 15 mL every 3 hours as needed. May swallow. 12/26/17   Coral Spikes, DO  naproxen (NAPROSYN) 500 MG tablet Take 1 tablet (500 mg total) by mouth 2 (two) times daily with a meal. 12/28/17   Triplett, Cari B, FNP  Sulfuric Acid-Sulf Phenolics 00-37 % SOLN Use as directed for canker sore. 12/26/17   Coral Spikes, DO  triamcinolone (KENALOG) 0.1 % paste Use as directed 1 application in the mouth or throat 2 (two) times daily. 12/28/17   Victorino Dike, FNP    Family History Family History  Problem Relation Age of Onset  . Hypertension Mother   . Kidney disease Mother   . Hyperlipidemia Mother   . Breast cancer Neg Hx     Social History Social History   Tobacco Use  . Smoking status: Former Smoker    Packs/day: 0.50    Years: 18.00    Pack years: 9.00  Types: Cigarettes    Last attempt to quit: 04/08/2017    Years since quitting: 1.0  . Smokeless tobacco: Never Used  Substance Use Topics  . Alcohol use: Yes    Comment: occasional  . Drug use: No     Allergies   Patient has no known allergies.   Review of Systems Review of Systems  Constitutional: Negative for fever.  Gastrointestinal: Negative for abdominal pain, nausea and vomiting.  Genitourinary: Positive for dysuria. Negative for flank pain and vaginal discharge.     Physical Exam Triage Vital Signs ED Triage Vitals [04/08/18 1751]  Enc Vitals Group     BP 110/62     Pulse Rate 69     Resp 16     Temp 98.1 F (36.7 C)     Temp Source Oral     SpO2 100 %     Weight 155 lb (70.3 kg)     Height 5\' 4"  (1.626 m)     Head Circumference      Peak Flow      Pain Score 0     Pain Loc      Pain Edu?      Excl. in [place]?    No data found.  Updated Vital Signs BP 110/62 (BP Location: Left Arm)   Pulse 69    Temp 98.1 F (36.7 C) (Oral)   Resp 16   Ht 5\' 4"  (1.626 m)   Wt 70.3 kg   SpO2 100%   BMI 26.61 kg/m   Visual Acuity Right Eye Distance:   Left Eye Distance:   Bilateral Distance:    Right Eye Near:   Left Eye Near:    Bilateral Near:     Physical Exam  Constitutional: She appears well-developed and well-nourished. No distress.  Abdominal: Soft. Bowel sounds are normal. She exhibits no distension and no mass. There is no tenderness. There is no rebound and no guarding.  Skin: She is not diaphoretic.  Nursing note and vitals reviewed.    UC Treatments / Results  Labs (all labs ordered are listed, but only abnormal results are displayed) Labs Reviewed  URINALYSIS, COMPLETE (UACMP) WITH MICROSCOPIC - Abnormal; Notable for the following components:      Result Value   Hgb urine dipstick SMALL (*)    Ketones, ur TRACE (*)    Nitrite POSITIVE (*)    All other components within normal limits    EKG None  Radiology No results found.  Procedures Procedures (including critical care time)  Medications Ordered in UC Medications - No data to display  Initial Impression / Assessment and Plan / UC Course  I have reviewed the triage vital signs and the nursing notes.  Pertinent labs & imaging results that were available during my care of the patient were reviewed by me and considered in my medical decision making (see chart for details).      Final Clinical Impressions(s) / UC Diagnoses   Final diagnoses:  Acute cystitis with hematuria     Discharge Instructions     Lots of water!    ED Prescriptions    Medication Sig Dispense Auth. Provider   cephALEXin (KEFLEX) 500 MG capsule Take 1 capsule (500 mg total) by mouth 2 (two) times daily. 10 capsule Norval Gable, MD     1. Lab results and diagnosis reviewed with patient 2. rx as per orders above; reviewed possible side effects, interactions, risks and benefits  3. Recommend supportive treatment with  fluids, otc analgesics prn  4. Follow-up prn if symptoms worsen or don't improve  Controlled Substance Prescriptions [place] Controlled Substance Registry consulted? Not Applicable   Norval Gable, MD 04/08/18 [phone]

## 2018-04-11 ENCOUNTER — Encounter: Payer: Self-pay | Admitting: Emergency Medicine

## 2018-04-11 ENCOUNTER — Emergency Department
Admission: EM | Admit: 2018-04-11 | Discharge: 2018-04-11 | Disposition: A | Discharge status: Home / Self Care | Payer: BLUE CROSS/BLUE SHIELD | Attending: Emergency Medicine | Admitting: Emergency Medicine

## 2018-04-11 ENCOUNTER — Other Ambulatory Visit: Payer: Self-pay

## 2018-04-11 DIAGNOSIS — R300 Dysuria: Secondary | ICD-10-CM

## 2018-04-11 DIAGNOSIS — Z5321 Procedure and treatment not carried out due to patient leaving prior to being seen by health care provider: Secondary | ICD-10-CM | POA: Diagnosis not present

## 2018-04-11 DIAGNOSIS — N390 Urinary tract infection, site not specified: Secondary | ICD-10-CM | POA: Diagnosis not present

## 2018-04-11 LAB — CBC
HCT: 42.2 % (ref 35.0–47.0)
Hemoglobin: 14.7 g/dL (ref 12.0–16.0)
MCH: 32.3 pg (ref 26.0–34.0)
MCHC: 34.8 g/dL (ref 32.0–36.0)
MCV: 92.7 fL (ref 80.0–100.0)
Platelets: 152 10*3/uL (ref 150–440)
RBC: 4.55 MIL/uL (ref 3.80–5.20)
RDW: 13.1 % (ref 11.5–14.5)
WBC: 4.5 10*3/uL (ref 3.6–11.0)

## 2018-04-11 LAB — URINALYSIS, COMPLETE (UACMP) WITH MICROSCOPIC
Bacteria, UA: NONE SEEN
Specific Gravity, Urine: 1.024 (ref 1.005–1.030)

## 2018-04-11 LAB — BASIC METABOLIC PANEL
Anion gap: 6 (ref 5–15)
BUN: 14 mg/dL (ref 6–20)
CO2: 25 mmol/L (ref 22–32)
Calcium: 9.2 mg/dL (ref 8.9–10.3)
Chloride: 109 mmol/L (ref 98–111)
Creatinine, Ser: 0.82 mg/dL (ref 0.44–1.00)
GFR calc Af Amer: >60 mL/min (ref >60)
GFR calc non Af Amer: >60 mL/min (ref >60)
Glucose, Bld: 105 mg/dL — ABNORMAL HIGH (ref 70–99)
Potassium: 3.5 mmol/L (ref 3.5–5.1)
Sodium: 140 mmol/L (ref 135–145)

## 2018-04-11 LAB — POCT PREGNANCY, URINE: Preg Test, Ur: NEGATIVE

## 2018-04-11 NOTE — ED Provider Notes (Signed)
Patient eloped prior to being seen by me. Labs reviewed and no signs of sepsis or persistent UTI seen.    Rudene Re, MD 04/11/18 [phone]

## 2018-04-11 NOTE — ED Notes (Signed)
Patient came to the nursing desk, states she can't wait any longer because she had a birthday party at 2pm that she needed to get ready for. MD aware.

## 2018-04-11 NOTE — ED Triage Notes (Signed)
States has a UTI.  Seen through urgent care on Wednesday - started on cephalexin.    States symptoms worse today, not improving.

## 2018-04-12 ENCOUNTER — Emergency Department
Admission: EM | Admit: 2018-04-12 | Discharge: 2018-04-12 | Disposition: A | Discharge status: Home / Self Care | Payer: BLUE CROSS/BLUE SHIELD | Attending: Emergency Medicine | Admitting: Emergency Medicine

## 2018-04-12 ENCOUNTER — Encounter: Payer: Self-pay | Admitting: Emergency Medicine

## 2018-04-12 ENCOUNTER — Emergency Department: Payer: BLUE CROSS/BLUE SHIELD

## 2018-04-12 DIAGNOSIS — R102 Pelvic and perineal pain: Secondary | ICD-10-CM | POA: Diagnosis not present

## 2018-04-12 DIAGNOSIS — Z87891 Personal history of nicotine dependence: Secondary | ICD-10-CM | POA: Insufficient documentation

## 2018-04-12 DIAGNOSIS — R310 Gross hematuria: Secondary | ICD-10-CM

## 2018-04-12 DIAGNOSIS — R3915 Urgency of urination: Secondary | ICD-10-CM

## 2018-04-12 DIAGNOSIS — R300 Dysuria: Secondary | ICD-10-CM

## 2018-04-12 DIAGNOSIS — R350 Frequency of micturition: Secondary | ICD-10-CM | POA: Diagnosis not present

## 2018-04-12 DIAGNOSIS — D3502 Benign neoplasm of left adrenal gland: Secondary | ICD-10-CM | POA: Diagnosis not present

## 2018-04-12 DIAGNOSIS — Z79899 Other long term (current) drug therapy: Secondary | ICD-10-CM | POA: Insufficient documentation

## 2018-04-12 DIAGNOSIS — N200 Calculus of kidney: Secondary | ICD-10-CM | POA: Diagnosis not present

## 2018-04-12 DIAGNOSIS — R1024 Suprapubic pain: Secondary | ICD-10-CM

## 2018-04-12 LAB — URINALYSIS, COMPLETE (UACMP) WITH MICROSCOPIC: Specific Gravity, Urine: 1.021 (ref 1.005–1.030)

## 2018-04-12 LAB — URINE CULTURE: Culture: NO GROWTH

## 2018-04-12 MED ORDER — SULFAMETHOXAZOLE-TRIMETHOPRIM 800-160 MG PO TABS
1.0000 | ORAL_TABLET | Freq: Once | ORAL | Status: AC
  Administered 2018-04-12: 1 via ORAL
  Filled 2018-04-12: qty 1

## 2018-04-12 MED ORDER — SULFAMETHOXAZOLE-TRIMETHOPRIM 400-80 MG PO TABS: 1.0000 | ORAL_TABLET | Freq: Two times a day (BID) | ORAL | 0 refills | Status: DC

## 2018-04-12 NOTE — ED Provider Notes (Signed)
[facility] Emergency Department Provider Note ____________________________________________  Time seen: 2225  I have reviewed the triage vital signs and the nursing notes.  HISTORY  Chief Complaint  Urinary Tract Infection   HPI Diane Roberts is a 37 y.o. female presents to the clinic today with persistent urgency, frequency, dysuria and blood in her urine. This started 6 days ago. She was seen at [facility] for the same 8/28. Urinalysis was concerning for infection, but no culture done. She was treated with Keflex x 5 days. She reports she took her last pill today. She continues to have symptoms that are getting worse instead of better. She denies fever, chills or low back pain. She denies nausea or vomiting. She denies vaginal complaints, reports she has not been sexually active in 3 months. She has tried AZO with minimal relief. She has no history of kidney stones, but reports it runs in her family. She has had ovarian cysts in the past but reports this feels different.  Past Medical History:  Diagnosis Date  . Breast mass 2018   bil masses felt by MD  . Frequent headaches     Patient Active Problem List   Diagnosis Date Noted  . Frequent headaches 09/09/2013    Past Surgical History:  Procedure Laterality Date  . APPENDECTOMY  2012  . CHOLECYSTECTOMY  2002  . OOPHORECTOMY Right 2013  . TONSILLECTOMY  2010    Prior to Admission medications   Medication Sig Start Date End Date Taking? Authorizing Provider  chlorhexidine (PERIDEX) 0.12 % solution Use as directed 15 mLs in the mouth or throat 2 (two) times daily. 12/28/17   Triplett, Cari B, FNP  ibuprofen (ADVIL,MOTRIN) 200 MG tablet Take 800 mg by mouth every 4 (four) hours as needed.    [provider]  lidocaine (XYLOCAINE) 2 % solution Gargle 15 mL every 3 hours as needed. May swallow. 12/26/17   Coral Spikes, DO  naproxen (NAPROSYN) 500 MG tablet Take 1 tablet (500 mg total) by mouth 2 (two)  times daily with a meal. 12/28/17   Triplett, Cari B, FNP  sulfamethoxazole-trimethoprim (BACTRIM) 400-80 MG tablet Take 1 tablet by mouth 2 (two) times daily. 04/12/18   Jearld Fenton, NP  Sulfuric Acid-Sulf Phenolics 16-10 % SOLN Use as directed for canker sore. 12/26/17   Coral Spikes, DO  triamcinolone (KENALOG) 0.1 % paste Use as directed 1 application in the mouth or throat 2 (two) times daily. 12/28/17   Victorino Dike, FNP    Allergies Patient has no known allergies.  Family History  Problem Relation Age of Onset  . Hypertension Mother   . Kidney disease Mother   . Hyperlipidemia Mother   . Breast cancer Neg Hx     Social History Social History   Tobacco Use  . Smoking status: Former Smoker    Packs/day: 0.50    Years: 18.00    Pack years: 9.00    Types: Cigarettes    Last attempt to quit: 04/08/2017    Years since quitting: 1.0  . Smokeless tobacco: Never Used  Substance Use Topics  . Alcohol use: Yes    Comment: occasional  . Drug use: No    Review of Systems  Constitutional: Negative for fever, chills or body aches. Cardiovascular: Negative for chest pain. Respiratory: Negative for shortness of breath. Gastrointestinal: Positive for abdominal pain.  Negative for vomiting and diarrhea. Genitourinary: Positive for urgency, frequency, dysuria and blood in the urine.  Negative for vaginal discharge, irritation, odor or abnormal bleeding..  ____________________________________________  PHYSICAL EXAM:  VITAL SIGNS: ED Triage Vitals  Enc Vitals Group     BP 04/12/18 2151 122/74     Pulse Rate 04/12/18 2151 89     Resp 04/12/18 2151 18     Temp 04/12/18 2151 98.1 F (36.7 C)     Temp Source 04/12/18 2151 Oral     SpO2 04/12/18 2151 100 %     Weight 04/12/18 2152 151 lb (68.5 kg)     Height 04/12/18 2152 5\' 4"  (1.626 m)     Head Circumference --      Peak Flow --      Pain Score 04/12/18 2152 7     Pain Loc --      Pain Edu? --      Excl. in [place]? --      Constitutional: Alert and oriented.  Tearful. Cardiovascular: Normal rate, regular rhythm.  Respiratory: Normal respiratory effort. No wheezes/rales/rhonchi. Gastrointestinal: Soft and tender in the suprapubic region. No distention.  No CVA tenderness noted. Pelvic: Normal female anatomy. No discharge, odor, or abnormal bleeding noted. Musculoskeletal: Nontender with normal range of motion in all extremities.  Neurologic:   Normal speech and language.  ____________________________________________   LABS BMET    Component Value Date/Time   NA 140 04/11/2018 0913   NA 140 07/22/2014 1631   K 3.5 04/11/2018 0913   K 3.3 (L) 07/22/2014 1631   CL 109 04/11/2018 0913   CL 108 (H) 07/22/2014 1631   CO2 25 04/11/2018 0913   CO2 25 07/22/2014 1631   GLUCOSE 105 (H) 04/11/2018 0913   GLUCOSE 109 (H) 07/22/2014 1631   BUN 14 04/11/2018 0913   BUN 8 07/22/2014 1631   CREATININE 0.82 04/11/2018 0913   CREATININE 0.83 07/22/2014 1631   CALCIUM 9.2 04/11/2018 0913   CALCIUM 8.8 07/22/2014 1631   GFRNONAA >60 04/11/2018 0913   GFRNONAA >60 07/22/2014 1631   GFRAA >60 04/11/2018 0913   GFRAA >60 07/22/2014 1631   CBC    Component Value Date/Time   WBC 4.5 04/11/2018 0913   RBC 4.55 04/11/2018 0913   HGB 14.7 04/11/2018 0913   HGB 14.8 07/22/2014 1631   HCT 42.2 04/11/2018 0913   HCT 44.1 07/22/2014 1631   PLT 152 04/11/2018 0913   PLT 138 (L) 07/22/2014 1631   MCV 92.7 04/11/2018 0913   MCV 91 07/22/2014 1631   MCH 32.3 04/11/2018 0913   MCHC 34.8 04/11/2018 0913   RDW 13.1 04/11/2018 0913   RDW 12.3 07/22/2014 1631   LYMPHSABS 2.1 10/13/2017 0239   MONOABS 0.4 10/13/2017 0239   EOSABS 0.1 10/13/2017 0239   BASOSABS 0.1 10/13/2017 0239   Urinalysis    Component Value Date/Time   COLORURINE ORANGE (A) 04/12/2018 2156   APPEARANCEUR HAZY (A) 04/12/2018 2156   LABSPEC 1.021 04/12/2018 2156   PHURINE  04/12/2018 2156    TEST NOT REPORTED DUE TO COLOR INTERFERENCE OF  URINE PIGMENT   GLUCOSEU (A) 04/12/2018 2156    TEST NOT REPORTED DUE TO COLOR INTERFERENCE OF URINE PIGMENT   HGBUR (A) 04/12/2018 2156    TEST NOT REPORTED DUE TO COLOR INTERFERENCE OF URINE PIGMENT   BILIRUBINUR (A) 04/12/2018 2156    TEST NOT REPORTED DUE TO COLOR INTERFERENCE OF URINE PIGMENT   KETONESUR (A) 04/12/2018 2156    TEST NOT REPORTED DUE TO COLOR INTERFERENCE OF URINE PIGMENT   PROTEINUR (A) 04/12/2018  2156    TEST NOT REPORTED DUE TO COLOR INTERFERENCE OF URINE PIGMENT   NITRITE (A) 04/12/2018 2156    TEST NOT REPORTED DUE TO COLOR INTERFERENCE OF URINE PIGMENT   LEUKOCYTESUR (A) 04/12/2018 2156    TEST NOT REPORTED DUE TO COLOR INTERFERENCE OF URINE PIGMENT    ____________________________________________   RADIOLOGY  Imaging Orders     CT Renal Stone Study  IMPRESSION: 1. 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 No ureteral stones. No hydroureteronephrosis to suggest urinary tract obstruction. 2. No acute findings are noted in the abdomen or pelvis. 3. Small left adrenal adenoma. 4. Mild atherosclerosis. ____________________________________________   INITIAL IMPRESSION / ASSESSMENT AND PLAN / ED COURSE  Urgency, Frequency, Dysuria, Blood in Urine:  DDx include recurrent UTI, interstitial cystitis, kidney stone, ovarian cyst CT renal stone study shows multiple stones in the kidneys but nothing in the bladder or ureters RX for Septra 1 tab PO BID x 5 days Encouraged fluid intake    I reviewed the patient's prescription history over the last 12 months in the multi-state controlled substances database(s) that includes [place], [place], [place], [place], [place], [place], [place], [place], [place], [place], [place], [place], [place], and [place].  Results were notable for no recent controlled  substances. ____________________________________________  FINAL CLINICAL IMPRESSION(S) / ED DIAGNOSES  Final diagnoses:  Dysuria  Urinary frequency  Urinary urgency  Gross hematuria  Suprapubic pain      Jearld Fenton, NP 04/12/18 2332    Orbie Pyo, MD 04/15/18 [phone]

## 2018-04-12 NOTE — ED Notes (Signed)
Pt states she has had uti symptoms since Tuesday. Pt states she has had three visits to a provider for same since Tuesday. Pt states she was prescribed keflex for same on Wednesday with no improvement in symptoms. Pt states she has "severe" pelvic pain, burning with urination. Pt states she has also had nose bleeds since Wednesday. Pt denies vomiting, fever, or nausea. Pt denies known hematuria. Pt states she has been taking AZO without relief of symptoms. Pt complains of back pain, bilateral.

## 2018-04-12 NOTE — Discharge Instructions (Addendum)
We are treating you for a presumed UTI. Your CT scan showed multiple small kidney stones in the kidney, but not in the ureters or bladder. Continue AZO OTC. Follow up with your PCP as needed if symptoms worsen

## 2018-04-14 ENCOUNTER — Encounter: Payer: Self-pay | Admitting: Emergency Medicine

## 2018-04-14 ENCOUNTER — Emergency Department: Payer: BLUE CROSS/BLUE SHIELD

## 2018-04-14 ENCOUNTER — Emergency Department
Admission: EM | Admit: 2018-04-14 | Discharge: 2018-04-14 | Disposition: A | Discharge status: Home / Self Care | Payer: BLUE CROSS/BLUE SHIELD | Attending: Emergency Medicine | Admitting: Emergency Medicine

## 2018-04-14 DIAGNOSIS — D259 Leiomyoma of uterus, unspecified: Secondary | ICD-10-CM | POA: Diagnosis not present

## 2018-04-14 DIAGNOSIS — N3289 Other specified disorders of bladder: Secondary | ICD-10-CM | POA: Diagnosis not present

## 2018-04-14 DIAGNOSIS — R1030 Lower abdominal pain, unspecified: Secondary | ICD-10-CM | POA: Diagnosis not present

## 2018-04-14 DIAGNOSIS — R102 Pelvic and perineal pain: Secondary | ICD-10-CM | POA: Insufficient documentation

## 2018-04-14 LAB — CBC WITH DIFFERENTIAL/PLATELET
Basophils Absolute: 0.1 10*3/uL (ref 0–0.1)
Basophils Relative: 1 %
Eosinophils Absolute: 0.1 10*3/uL (ref 0–0.7)
Eosinophils Relative: 2 %
HCT: 42 % (ref 35.0–47.0)
Hemoglobin: 14.8 g/dL (ref 12.0–16.0)
Lymphocytes Relative: 29 %
Lymphs Abs: 1.8 10*3/uL (ref 1.0–3.6)
MCH: 32.3 pg (ref 26.0–34.0)
MCHC: 35.4 g/dL (ref 32.0–36.0)
MCV: 91.3 fL (ref 80.0–100.0)
Monocytes Absolute: 0.4 10*3/uL (ref 0.2–0.9)
Monocytes Relative: 7 %
Neutro Abs: 3.8 10*3/uL (ref 1.4–6.5)
Neutrophils Relative %: 61 %
Platelets: 168 10*3/uL (ref 150–440)
RBC: 4.6 MIL/uL (ref 3.80–5.20)
RDW: 12.5 % (ref 11.5–14.5)
WBC: 6.2 10*3/uL (ref 3.6–11.0)

## 2018-04-14 LAB — COMPREHENSIVE METABOLIC PANEL
ALT: 15 U/L (ref 0–44)
AST: 18 U/L (ref 15–41)
Albumin: 4.3 g/dL (ref 3.5–5.0)
Alkaline Phosphatase: 45 U/L (ref 38–126)
Anion gap: 7 (ref 5–15)
BUN: 10 mg/dL (ref 6–20)
CO2: 25 mmol/L (ref 22–32)
Calcium: 9.1 mg/dL (ref 8.9–10.3)
Chloride: 105 mmol/L (ref 98–111)
Creatinine, Ser: 0.8 mg/dL (ref 0.44–1.00)
GFR calc Af Amer: >60 mL/min (ref >60)
GFR calc non Af Amer: >60 mL/min (ref >60)
Glucose, Bld: 96 mg/dL (ref 70–99)
Potassium: 3.8 mmol/L (ref 3.5–5.1)
Sodium: 137 mmol/L (ref 135–145)
Total Bilirubin: 0.9 mg/dL (ref 0.3–1.2)
Total Protein: 6.6 g/dL (ref 6.5–8.1)

## 2018-04-14 LAB — URINALYSIS, COMPLETE (UACMP) WITH MICROSCOPIC
Bacteria, UA: NONE SEEN
Specific Gravity, Urine: 1.017 (ref 1.005–1.030)

## 2018-04-14 LAB — WET PREP, GENITAL
Clue Cells Wet Prep HPF POC: NONE SEEN
Sperm: NONE SEEN
Trich, Wet Prep: NONE SEEN
Yeast Wet Prep HPF POC: NONE SEEN

## 2018-04-14 LAB — CHLAMYDIA/NGC RT PCR (ARMC ONLY)
Chlamydia Tr: NOT DETECTED
N gonorrhoeae: NOT DETECTED

## 2018-04-14 MED ORDER — OXYCODONE-ACETAMINOPHEN 5-325 MG PO TABS: 1.0000 | ORAL_TABLET | Freq: Three times a day (TID) | ORAL | 0 refills | Status: DC | PRN

## 2018-04-14 MED ORDER — PHENAZOPYRIDINE HCL 200 MG PO TABS: 200.0000 mg | ORAL_TABLET | Freq: Three times a day (TID) | ORAL | 0 refills | Status: DC | PRN

## 2018-04-14 MED ORDER — OXYCODONE-ACETAMINOPHEN 5-325 MG PO TABS
2.0000 | ORAL_TABLET | Freq: Once | ORAL | Status: AC
  Administered 2018-04-14: 2 via ORAL
  Filled 2018-04-14: qty 2

## 2018-04-14 MED ORDER — PHENAZOPYRIDINE HCL 200 MG PO TABS
200.0000 mg | ORAL_TABLET | Freq: Once | ORAL | Status: AC
  Administered 2018-04-14: 200 mg via ORAL
  Filled 2018-04-14: qty 1

## 2018-04-14 NOTE — ED Triage Notes (Signed)
Pt reports UTI for a while, has been seen a couple of times for it. Was here last week to be seen again and had a CT scan of her abdomen and was given bactrim. Pt reports has been taking meds as prescribed and the sx's are getting worse.

## 2018-04-14 NOTE — ED Provider Notes (Signed)
[place] [facility] Emergency Department Provider Note       Time seen: ----------------------------------------- 9:11 AM on 04/14/2018 -----------------------------------------   I have reviewed the triage vital signs and the nursing notes.  HISTORY   Chief Complaint Urinary Frequency and Abdominal Pain    HPI Diane Roberts is a 37 y.o. female with no significant past medical history who presents to the ED for persistent urinary tract infection symptoms.  Patient states she has had over a week of symptoms with dysuria and feeling like she is having bladder spasms.  She has been taking antibiotics and Azo without any improvement.  She had a CT of her abdomen pelvis several days ago which did not reveal any acute process.  She states the symptoms are still getting worse.  Past Medical History:  Diagnosis Date  . Breast mass 2018   bil masses felt by MD  . Frequent headaches     Patient Active Problem List   Diagnosis Date Noted  . Frequent headaches 09/09/2013    Past Surgical History:  Procedure Laterality Date  . APPENDECTOMY  2012  . CHOLECYSTECTOMY  2002  . OOPHORECTOMY Right 2013  . TONSILLECTOMY  2010    Allergies Patient has no known allergies.  Social History Social History   Tobacco Use  . Smoking status: Former Smoker    Packs/day: 0.50    Years: 18.00    Pack years: 9.00    Types: Cigarettes    Last attempt to quit: 04/08/2017    Years since quitting: 1.0  . Smokeless tobacco: Never Used  Substance Use Topics  . Alcohol use: Yes    Comment: occasional  . Drug use: No   Review of Systems Constitutional: Negative for fever. Cardiovascular: Negative for chest pain. Respiratory: Negative for shortness of breath. Gastrointestinal: Positive for abdominal pain Genitourinary: positive for dysuria Musculoskeletal: Negative for back pain. Skin: Negative for rash. Neurological: Negative for headaches, focal weakness or  numbness.  All systems negative/normal/unremarkable except as stated in the HPI  ____________________________________________   PHYSICAL EXAM:  VITAL SIGNS: ED Triage Vitals [04/14/18 0859]  Enc Vitals Group     BP (!) 145/102     Pulse Rate 82     Resp 20     Temp 97.8 F (36.6 C)     Temp Source Oral     SpO2 99 %     Weight 151 lb (68.5 kg)     Height 5\' 4"  (1.626 m)     Head Circumference      Peak Flow      Pain Score 10     Pain Loc      Pain Edu?      Excl. in [place]?    Constitutional: Alert and oriented.  Mild distress Eyes: Conjunctivae are normal. Normal extraocular movements. Cardiovascular: Normal rate, regular rhythm. No murmurs, rubs, or gallops. Respiratory: Normal respiratory effort without tachypnea nor retractions. Breath sounds are clear and equal bilaterally. No wheezes/rales/rhonchi. Gastrointestinal: Suprapubic tenderness, no rebound or guarding.  Normal bowel sounds. Genitourinary: Diffuse tenderness, cervical motion tenderness, left adnexal tenderness, mild discharge Musculoskeletal: Nontender with normal range of motion in extremities. No lower extremity tenderness nor edema. Neurologic:  Normal speech and language. No gross focal neurologic deficits are appreciated.  Skin:  Skin is warm, dry and intact. No rash noted. Psychiatric: Mood and affect are normal. Speech and behavior are normal.  ____________________________________________  ED COURSE:  As part of my medical decision making, I  reviewed the following data within the [place] History obtained from family if available, nursing notes, old chart and ekg, as well as notes from prior ED visits. Patient presented for suprapubic and pelvic pain, we will assess with labs and imaging as indicated at this time.   Procedures ____________________________________________   LABS (pertinent positives/negatives)  Labs Reviewed  WET PREP, GENITAL - Abnormal; Notable for the  following components:      Result Value   WBC, Wet Prep HPF POC MANY (*)    All other components within normal limits  URINALYSIS, COMPLETE (UACMP) WITH MICROSCOPIC - Abnormal; Notable for the following components:   Color, Urine ORANGE (*)    APPearance CLEAR (*)    Glucose, UA   (*)    Value: TEST NOT REPORTED DUE TO COLOR INTERFERENCE OF URINE PIGMENT   Hgb urine dipstick   (*)    Value: TEST NOT REPORTED DUE TO COLOR INTERFERENCE OF URINE PIGMENT   Bilirubin Urine   (*)    Value: TEST NOT REPORTED DUE TO COLOR INTERFERENCE OF URINE PIGMENT   Ketones, ur   (*)    Value: TEST NOT REPORTED DUE TO COLOR INTERFERENCE OF URINE PIGMENT   Protein, ur   (*)    Value: TEST NOT REPORTED DUE TO COLOR INTERFERENCE OF URINE PIGMENT   Nitrite   (*)    Value: TEST NOT REPORTED DUE TO COLOR INTERFERENCE OF URINE PIGMENT   Leukocytes, UA   (*)    Value: TEST NOT REPORTED DUE TO COLOR INTERFERENCE OF URINE PIGMENT   All other components within normal limits  CHLAMYDIA/NGC RT PCR ([facility] ONLY)  CBC WITH DIFFERENTIAL/PLATELET  COMPREHENSIVE METABOLIC PANEL    RADIOLOGY Images were viewed by me  Pelvic ultrasound IMPRESSION: Normal sized uterus containing small fibroids. Normal appearing endometrium.  Normal appearing right ovary. The left ovary contains a small cyst measuring 2.1 cm in greatest dimension. No evidence of ovarian torsion or tubo-ovarian abscess. ____________________________________________  DIFFERENTIAL DIAGNOSIS   Ovarian cyst, UTI, renal colic, PID  FINAL ASSESSMENT AND PLAN  Pelvic pain, bladder spasms   Plan: The patient had presented for persistent pelvic pain. Patient's labs did not reveal any acute process. Patient's imaging did reveal left-sided ovarian cyst as well as fibroids which may be causing some of her symptoms.  She will be given Pyridium as well as short supply of Percocet and she will be referred to GYN for close outpatient  follow-up.   Laurence Aly, MD   Note: This note was generated in part or whole with voice recognition software. Voice recognition is usually quite accurate but there are transcription errors that can and very often do occur. I apologize for any typographical errors that were not detected and corrected.     Earleen Newport, MD 04/14/18 1149

## 2018-04-20 DIAGNOSIS — R300 Dysuria: Secondary | ICD-10-CM | POA: Diagnosis not present

## 2018-04-20 DIAGNOSIS — R102 Pelvic and perineal pain: Secondary | ICD-10-CM | POA: Diagnosis not present

## 2018-04-20 DIAGNOSIS — N200 Calculus of kidney: Secondary | ICD-10-CM | POA: Diagnosis not present

## 2018-04-20 DIAGNOSIS — D259 Leiomyoma of uterus, unspecified: Secondary | ICD-10-CM | POA: Diagnosis not present

## 2018-04-21 ENCOUNTER — Ambulatory Visit: Payer: BLUE CROSS/BLUE SHIELD | Admitting: Obstetrics and Gynecology

## 2018-04-21 ENCOUNTER — Encounter: Payer: Self-pay | Admitting: Obstetrics and Gynecology

## 2018-04-21 ENCOUNTER — Other Ambulatory Visit (HOSPITAL_COMMUNITY)
Admission: RE | Admit: 2018-04-21 | Discharge: 2018-04-21 | Disposition: A | Discharge status: Home / Self Care | Payer: BLUE CROSS/BLUE SHIELD | Source: Ambulatory Visit | Attending: Obstetrics and Gynecology | Admitting: Obstetrics and Gynecology

## 2018-04-21 VITALS — BP 110/70 | HR 95 | Ht 65.0 in | Wt 156.9 lb

## 2018-04-21 DIAGNOSIS — Z9889 Other specified postprocedural states: Secondary | ICD-10-CM

## 2018-04-21 DIAGNOSIS — R3915 Urgency of urination: Secondary | ICD-10-CM | POA: Diagnosis not present

## 2018-04-21 DIAGNOSIS — D259 Leiomyoma of uterus, unspecified: Secondary | ICD-10-CM | POA: Insufficient documentation

## 2018-04-21 DIAGNOSIS — Z124 Encounter for screening for malignant neoplasm of cervix: Secondary | ICD-10-CM

## 2018-04-21 DIAGNOSIS — R102 Pelvic and perineal pain: Secondary | ICD-10-CM | POA: Diagnosis not present

## 2018-04-21 DIAGNOSIS — N200 Calculus of kidney: Secondary | ICD-10-CM

## 2018-04-21 DIAGNOSIS — R350 Frequency of micturition: Secondary | ICD-10-CM | POA: Diagnosis not present

## 2018-04-21 DIAGNOSIS — N898 Other specified noninflammatory disorders of vagina: Secondary | ICD-10-CM | POA: Insufficient documentation

## 2018-04-21 DIAGNOSIS — N83202 Unspecified ovarian cyst, left side: Secondary | ICD-10-CM

## 2018-04-21 LAB — POCT URINALYSIS DIPSTICK
Blood, UA: NEGATIVE
Glucose, UA: NEGATIVE
Ketones, UA: NEGATIVE
Leukocytes, UA: NEGATIVE
Nitrite, UA: NEGATIVE
Odor: NEGATIVE
Protein, UA: NEGATIVE
Spec Grav, UA: 1.015 (ref 1.010–1.025)
Urobilinogen, UA: 0.2 E.U./dL
pH, UA: 6.5 (ref 5.0–8.0)

## 2018-04-21 NOTE — Progress Notes (Signed)
GYN ENCOUNTER NOTE  Subjective:       Diane Roberts is a 37 y.o. G12P2002 female is here for gynecologic evaluation of the following issues:  1.  Pelvic pain 2.  Urinary frequency and urgency  37 year old single white female para 2-0-0-2, using nothing for contraception, status post endometrial ablation in 2015, amenorrheic, presents for evaluation of pelvic pain and urinary symptoms as noted above. Patient has history of 2 weeks of dysuria, dribbling, incomplete bladder emptying and feeling of bladder spasm.  2 weeks ago she was treated in the walk-in clinic with Cipro.  No urine culture was done. A week ago Saturday she was seen in the ER and urinalysis was negative; she was given Pyridium 6 days ago the patient was seen in the ER on Sunday and urinalysis again was negative; however she was having severe bladder spasms; she was given Bactrim for treatment. Patient was evaluated in the emergency room with CT scan and ultrasound. CT scan: Nonobstructive bilateral renal stones; essentially normal pelvis without acute changes. Ultrasound: Multi-fibroid uterus with 2 fibroids measuring approximate 1.5 cm; left ovarian cyst measuring 1.5 cm; no free fluid Patient has been given Percocet for her pain.  Past OB history: SVD x2; 7 pound 14 ounce female, and 9 pound 3 ounce female  Past GYN history: Menarche age 25. Status post endometrial ablation in 2015 by Dr. Serita Kyle. Amenorrheic at this time. No vasomotor symptoms. Prior to endometrial ablation menstrual cycles were monthly. History of dysmenorrhea that was treated with Midol and Pamprin; she described her pelvic pain is central cramping without back pain or radiation into the buttocks or thighs. She reported dysmenorrhea with heavy bleeding for 7 days. No family history of endometriosis other than 2 paternal cousins having endometriosis. 2013 laparoscopic ovarian cystectomy was performed. Patient denies deep thrusting  dyspareunia. Patient denies history of STDs; both she and her partner in March 2019 was screened for STDs Patient denies history of abnormal Pap smear since age 62; no recent Pap smear Patient states that she has had long history of heavy bleeding; it did not respond to Depo-Provera after first year of use; Mirena IUD was unsuccessful in controlling bleeding; Nexplanon was unsuccessful in controlling bleeding.  She states that she did use birth control pills in the past which seemed to regulate her bleeding somewhat.  Patient has no history of chronic pelvic back pain which is cyclic approximately once or twice a month; the pain is in her lumbosacral region with radiation to her left leg; for this pain she takes approximately 12 ibuprofen tablets a day.   Gynecologic History No LMP recorded (lmp unknown). Patient has had an ablation. Contraception: None Status post endometrial ablation 2015  OB History  Gravida Para Term Preterm AB Living  2 2 2     2   SAB TAB Ectopic Multiple Live Births          2    # Outcome Date GA Lbr Len/2nd Weight Sex Delivery Anes PTL Lv  2 Term 2012   9 lb 1.9 oz (4.137 kg) M Vag-Spont   LIV  1 Term 2002   7 lb 2.2 oz (3.239 kg) F Vag-Spont   LIV    Past Medical History:  Diagnosis Date  . Breast mass 2018   bil masses felt by MD  . Frequent headaches   . Kidney stones     Past Surgical History:  Procedure Laterality Date  . ablation     ut  .  APPENDECTOMY  2012  . CHOLECYSTECTOMY  2002  . TONSILLECTOMY  2010    Current Outpatient Medications on File Prior to Visit  Medication Sig Dispense Refill  . hydrOXYzine (ATARAX/VISTARIL) 25 MG tablet Take 25 mg by mouth 3 (three) times daily as needed.    Marland Kitchen ibuprofen (ADVIL,MOTRIN) 200 MG tablet Take 800 mg by mouth every 4 (four) hours as needed.    Marland Kitchen oxyCODONE-acetaminophen (PERCOCET) 5-325 MG tablet Take 1 tablet by mouth every 8 (eight) hours as needed. 20 tablet 0  . phenazopyridine (PYRIDIUM) 200  MG tablet Take 1 tablet (200 mg total) by mouth 3 (three) times daily as needed for pain. 20 tablet 0   No current facility-administered medications on file prior to visit.     No Known Allergies  Social History   Socioeconomic History  . Marital status: Single    Spouse name: Not on file  . Number of children: Not on file  . Years of education: Not on file  . Highest education level: Not on file  Occupational History  . Not on file  Social Needs  . Financial resource strain: Not on file  . Food insecurity:    Worry: Not on file    Inability: Not on file  . Transportation needs:    Medical: Not on file    Non-medical: Not on file  Tobacco Use  . Smoking status: Former Smoker    Packs/day: 0.50    Years: 18.00    Pack years: 9.00    Types: Cigarettes    Last attempt to quit: 04/08/2017    Years since quitting: 1.0  . Smokeless tobacco: Never Used  Substance and Sexual Activity  . Alcohol use: Yes    Comment: occasional  . Drug use: No  . Sexual activity: Yes    Comment: ablation  Lifestyle  . Physical activity:    Days per week: Not on file    Minutes per session: Not on file  . Stress: Not on file  Relationships  . Social connections:    Talks on phone: Not on file    Gets together: Not on file    Attends religious service: Not on file    Active member of club or organization: Not on file    Attends meetings of clubs or organizations: Not on file    Relationship status: Not on file  . Intimate partner violence:    Fear of current or ex partner: Not on file    Emotionally abused: Not on file    Physically abused: Not on file    Forced sexual activity: Not on file  Other Topics Concern  . Not on file  Social History Narrative  . Not on file    Family History  Problem Relation Age of Onset  . Hypertension Mother   . Kidney disease Mother   . Hyperlipidemia Mother   . Breast cancer Neg Hx   . Ovarian cancer Neg Hx   . Colon cancer Neg Hx     The  following portions of the patient's history were reviewed and updated as appropriate: allergies, current medications, past family history, past medical history, past social history, past surgical history and problem list.  Review of Systems  Objective:   BP 110/70   Pulse 95   Ht 5\' 5"  (1.651 m)   Wt 156 lb 14.4 oz (71.2 kg)   LMP  (LMP Unknown)   BMI 26.11 kg/m  CONSTITUTIONAL: Well-developed, well-nourished female in no  acute distress.  HENT:  Normocephalic, atraumatic.  NECK: Normal range of motion, supple, no masses.  Normal thyroid.  SKIN: Skin is warm and dry. No rash noted. Not diaphoretic. No erythema. No pallor. [place]: Alert and oriented to person, place, and time. PSYCHIATRIC: Normal mood and affect. Normal behavior. Normal judgment and thought content. CARDIOVASCULAR:Not Examined RESPIRATORY: Not Examined BREASTS: Not Examined BACK: No CVA tenderness or spinal tenderness ABDOMEN: Soft, non distended; Non tender.  No Organomegaly. PELVIC:  External Genitalia: Normal  BUS: Normal  Vagina: Normal; frothy white discharge present; anterior vaginal wall is tender  Cervix:  no lesions; cervical motion tenderness 6/10  Uterus: Top normal size, shape,consistency, mobile, tender 6/10  Adnexa: Nonpalpable; right side nontender; left side tender 6/10  RV: Normal external exam  bladder: Tender MUSCULOSKELETAL: Normal range of motion. No tenderness.  No cyanosis, clubbing, or edema.     Assessment:   1. Pelvic pain - POCT urinalysis dipstick - Urine Culture  2. History of endometrial ablation  3. Urinary urgency  4. Urinary frequency  5. Uterine leiomyoma, unspecified location  6. Left ovarian cyst  7. Leukorrhea   Symptom complex is very suspicious for endometriosis/adenomyosis.  Other potential etiologies include interstitial cystitis of the bladder.  Does not appear that she actually ever had a UTI as there was no positive urine culture.  She does have  kidney stones but they are non-obstructing and likely asymptomatic.  It is doubtful that the uterine fibroids are symptomatic. It is doubtful that the ovarian cyst would be symptomatic unless it is associated with endometriosis. Plan:   1.  Recommend laparoscopy with peritoneal biopsies to rule out endometriosis.  Potentially she could have implants along the left uterosacral ligament as well as the bladder flap.  2.  Analgesic therapy: Ibuprofen 6 or milligrams 4 times a day and extra strength Tylenol 1000 g 4 times a day  3.  Return for preop appointment the week before surgery  4.  Literature on endometriosis and laparoscopy is given.  Brayton Mars, MD  Note: This dictation was prepared with Dragon dictation along with smaller phrase technology. Any transcriptional errors that result from this process are unintentional.

## 2018-04-21 NOTE — H&P (View-Only) (Signed)
GYN ENCOUNTER NOTE  Subjective:       Diane Roberts is a 37 y.o. G52P2002 female is here for gynecologic evaluation of the following issues:  1.  Pelvic pain 2.  Urinary frequency and urgency  37 year old single white female para 2-0-0-2, using nothing for contraception, status post endometrial ablation in 2015, amenorrheic, presents for evaluation of pelvic pain and urinary symptoms as noted above. Patient has history of 2 weeks of dysuria, dribbling, incomplete bladder emptying and feeling of bladder spasm.  2 weeks ago she was treated in the walk-in clinic with Cipro.  No urine culture was done. A week ago Saturday she was seen in the ER and urinalysis was negative; she was given Pyridium 6 days ago the patient was seen in the ER on Sunday and urinalysis again was negative; however she was having severe bladder spasms; she was given Bactrim for treatment. Patient was evaluated in the emergency room with CT scan and ultrasound. CT scan: Nonobstructive bilateral renal stones; essentially normal pelvis without acute changes. Ultrasound: Multi-fibroid uterus with 2 fibroids measuring approximate 1.5 cm; left ovarian cyst measuring 1.5 cm; no free fluid Patient has been given Percocet for her pain.  Past OB history: SVD x2; 7 pound 14 ounce female, and 9 pound 3 ounce female  Past GYN history: Menarche age 67. Status post endometrial ablation in 2015 by Dr. Serita Kyle. Amenorrheic at this time. No vasomotor symptoms. Prior to endometrial ablation menstrual cycles were monthly. History of dysmenorrhea that was treated with Midol and Pamprin; she described her pelvic pain is central cramping without back pain or radiation into the buttocks or thighs. She reported dysmenorrhea with heavy bleeding for 7 days. No family history of endometriosis other than 2 paternal cousins having endometriosis. 2013 laparoscopic ovarian cystectomy was performed. Patient denies deep thrusting  dyspareunia. Patient denies history of STDs; both she and her partner in March 2019 was screened for STDs Patient denies history of abnormal Pap smear since age 43; no recent Pap smear Patient states that she has had long history of heavy bleeding; it did not respond to Depo-Provera after first year of use; Mirena IUD was unsuccessful in controlling bleeding; Nexplanon was unsuccessful in controlling bleeding.  She states that she did use birth control pills in the past which seemed to regulate her bleeding somewhat.  Patient has no history of chronic pelvic back pain which is cyclic approximately once or twice a month; the pain is in her lumbosacral region with radiation to her left leg; for this pain she takes approximately 12 ibuprofen tablets a day.   Gynecologic History No LMP recorded (lmp unknown). Patient has had an ablation. Contraception: None Status post endometrial ablation 2015  OB History  Gravida Para Term Preterm AB Living  2 2 2     2   SAB TAB Ectopic Multiple Live Births          2    # Outcome Date GA Lbr Len/2nd Weight Sex Delivery Anes PTL Lv  2 Term 2012   9 lb 1.9 oz (4.137 kg) M Vag-Spont   LIV  1 Term 2002   7 lb 2.2 oz (3.239 kg) F Vag-Spont   LIV    Past Medical History:  Diagnosis Date  . Breast mass 2018   bil masses felt by MD  . Frequent headaches   . Kidney stones     Past Surgical History:  Procedure Laterality Date  . ablation     ut  .  APPENDECTOMY  2012  . CHOLECYSTECTOMY  2002  . TONSILLECTOMY  2010    Current Outpatient Medications on File Prior to Visit  Medication Sig Dispense Refill  . hydrOXYzine (ATARAX/VISTARIL) 25 MG tablet Take 25 mg by mouth 3 (three) times daily as needed.    Marland Kitchen ibuprofen (ADVIL,MOTRIN) 200 MG tablet Take 800 mg by mouth every 4 (four) hours as needed.    Marland Kitchen oxyCODONE-acetaminophen (PERCOCET) 5-325 MG tablet Take 1 tablet by mouth every 8 (eight) hours as needed. 20 tablet 0  . phenazopyridine (PYRIDIUM) 200  MG tablet Take 1 tablet (200 mg total) by mouth 3 (three) times daily as needed for pain. 20 tablet 0   No current facility-administered medications on file prior to visit.     No Known Allergies  Social History   Socioeconomic History  . Marital status: Single    Spouse name: Not on file  . Number of children: Not on file  . Years of education: Not on file  . Highest education level: Not on file  Occupational History  . Not on file  Social Needs  . Financial resource strain: Not on file  . Food insecurity:    Worry: Not on file    Inability: Not on file  . Transportation needs:    Medical: Not on file    Non-medical: Not on file  Tobacco Use  . Smoking status: Former Smoker    Packs/day: 0.50    Years: 18.00    Pack years: 9.00    Types: Cigarettes    Last attempt to quit: 04/08/2017    Years since quitting: 1.0  . Smokeless tobacco: Never Used  Substance and Sexual Activity  . Alcohol use: Yes    Comment: occasional  . Drug use: No  . Sexual activity: Yes    Comment: ablation  Lifestyle  . Physical activity:    Days per week: Not on file    Minutes per session: Not on file  . Stress: Not on file  Relationships  . Social connections:    Talks on phone: Not on file    Gets together: Not on file    Attends religious service: Not on file    Active member of club or organization: Not on file    Attends meetings of clubs or organizations: Not on file    Relationship status: Not on file  . Intimate partner violence:    Fear of current or ex partner: Not on file    Emotionally abused: Not on file    Physically abused: Not on file    Forced sexual activity: Not on file  Other Topics Concern  . Not on file  Social History Narrative  . Not on file    Family History  Problem Relation Age of Onset  . Hypertension Mother   . Kidney disease Mother   . Hyperlipidemia Mother   . Breast cancer Neg Hx   . Ovarian cancer Neg Hx   . Colon cancer Neg Hx     The  following portions of the patient's history were reviewed and updated as appropriate: allergies, current medications, past family history, past medical history, past social history, past surgical history and problem list.  Review of Systems  Objective:   BP 110/70   Pulse 95   Ht 5\' 5"  (1.651 m)   Wt 156 lb 14.4 oz (71.2 kg)   LMP  (LMP Unknown)   BMI 26.11 kg/m  CONSTITUTIONAL: Well-developed, well-nourished female in no  acute distress.  HENT:  Normocephalic, atraumatic.  NECK: Normal range of motion, supple, no masses.  Normal thyroid.  SKIN: Skin is warm and dry. No rash noted. Not diaphoretic. No erythema. No pallor. [place]: Alert and oriented to person, place, and time. PSYCHIATRIC: Normal mood and affect. Normal behavior. Normal judgment and thought content. CARDIOVASCULAR:Not Examined RESPIRATORY: Not Examined BREASTS: Not Examined BACK: No CVA tenderness or spinal tenderness ABDOMEN: Soft, non distended; Non tender.  No Organomegaly. PELVIC:  External Genitalia: Normal  BUS: Normal  Vagina: Normal; frothy white discharge present; anterior vaginal wall is tender  Cervix:  no lesions; cervical motion tenderness 6/10  Uterus: Top normal size, shape,consistency, mobile, tender 6/10  Adnexa: Nonpalpable; right side nontender; left side tender 6/10  RV: Normal external exam  bladder: Tender MUSCULOSKELETAL: Normal range of motion. No tenderness.  No cyanosis, clubbing, or edema.     Assessment:   1. Pelvic pain - POCT urinalysis dipstick - Urine Culture  2. History of endometrial ablation  3. Urinary urgency  4. Urinary frequency  5. Uterine leiomyoma, unspecified location  6. Left ovarian cyst  7. Leukorrhea   Symptom complex is very suspicious for endometriosis/adenomyosis.  Other potential etiologies include interstitial cystitis of the bladder.  Does not appear that she actually ever had a UTI as there was no positive urine culture.  She does have  kidney stones but they are non-obstructing and likely asymptomatic.  It is doubtful that the uterine fibroids are symptomatic. It is doubtful that the ovarian cyst would be symptomatic unless it is associated with endometriosis. Plan:   1.  Recommend laparoscopy with peritoneal biopsies to rule out endometriosis.  Potentially she could have implants along the left uterosacral ligament as well as the bladder flap.  2.  Analgesic therapy: Ibuprofen 6 or milligrams 4 times a day and extra strength Tylenol 1000 g 4 times a day  3.  Return for preop appointment the week before surgery  4.  Literature on endometriosis and laparoscopy is given.  Brayton Mars, MD  Note: This dictation was prepared with Dragon dictation along with smaller phrase technology. Any transcriptional errors that result from this process are unintentional.

## 2018-04-21 NOTE — Patient Instructions (Addendum)
1.  Laparoscopy with peritoneal biopsies will be scheduled to assess pelvic pain etiology. 2.  Preop appointment will be made 1 week prior to surgery. 3.  For pelvic pain I recommend the following:  Ibuprofen (Advil) 600 mg 4 times a day and  Tylenol extra strength 1000 mg 4 times a day. 4.  Literature on surgery and endometriosis is provided today 5.  Urine culture is obtained today.  Urinalysis is negative.  Diagnostic Laparoscopy A diagnostic laparoscopy is a procedure to diagnose diseases in the abdomen. During the procedure, a thin, lighted, pencil-sized instrument called a laparoscope is inserted into the abdomen through an incision. The laparoscope allows your health care provider to look at the organs inside your body. Tell a health care provider about:  Any allergies you have.  All medicines you are taking, including vitamins, herbs, eye drops, creams, and over-the-counter medicines.  Any problems you or family members have had with anesthetic medicines.  Any blood disorders you have.  Any surgeries you have had.  Any medical conditions you have. What are the risks? Generally, this is a safe procedure. However, problems can occur, which may include:  Infection.  Bleeding.  Damage to other organs.  Allergic reaction to the anesthetics used during the procedure.  What happens before the procedure?  Do not eat or drink anything after midnight on the night before the procedure or as directed by your health care provider.  Ask your health care provider about: ? Changing or stopping your regular medicines. ? Taking medicines such as aspirin and ibuprofen. These medicines can thin your blood. Do not take these medicines before your procedure if your health care provider instructs you not to.  Plan to have someone take you home after the procedure. What happens during the procedure?  You may be given a medicine to help you relax (sedative).  You will be given a  medicine to make you sleep (general anesthetic).  Your abdomen will be inflated with a gas. This will make your organs easier to see.  Small incisions will be made in your abdomen.  A laparoscope and other small instruments will be inserted into the abdomen through the incisions.  A tissue sample may be removed from an organ in the abdomen for examination.  The instruments will be removed from the abdomen.  The gas will be released.  The incisions will be closed with stitches (sutures). What happens after the procedure? Your blood pressure, heart rate, breathing rate, and blood oxygen level will be monitored often until the medicines you were given have worn off. This information is not intended to replace advice given to you by your health care provider. Make sure you discuss any questions you have with your health care provider. Document Released: 11/04/2000 Document Revised: 12/07/2015 Document Reviewed: 03/11/2014 Elsevier Interactive Patient Education  [employer].  Endometriosis Endometriosis is a condition in which the tissue that lines the uterus (endometrium) grows outside of its normal location. The tissue may grow in many locations close to the uterus, but it commonly grows on the ovaries, fallopian tubes, vagina, or bowel. When the uterus sheds the endometrium every menstrual cycle, there is bleeding wherever the endometrial tissue is located. This can cause pain because blood is irritating to tissues that are not normally exposed to it. What are the causes? The cause of endometriosis is not known. What increases the risk? You may be more likely to develop endometriosis if you:  Have a family history of endometriosis.  Have never given birth.  Started your period at age 46 or younger.  Have high levels of estrogen in your body.  Were exposed to a certain medicine (diethylstilbestrol) before you were born (in utero).  Had low birth weight.  Were born as a  twin, triplet, or other multiple.  Have a BMI of less than 25. BMI is an estimate of body fat and is calculated from height and weight.  What are the signs or symptoms? Often, there are no symptoms of this condition. If you do have symptoms, they may:  Vary depending on where your endometrial tissue is growing.  Occur during your menstrual period (most common) or midcycle.  Come and go, or you may go months with no symptoms at all.  Stop with menopause.  Symptoms may include:  Pain in the back or abdomen.  Heavier bleeding during periods.  Pain during sex.  Painful bowel movements.  Infertility.  Pelvic pain.  Bleeding more than once a month.  How is this diagnosed? This condition is diagnosed based on your symptoms and a physical exam. You may have tests, such as:  Blood tests and urine tests. These may be done to help rule out other possible causes of your symptoms.  Ultrasound, to look for abnormal tissues.  An X-ray of the lower bowel (barium enema).  An ultrasound that is done through the vagina (transvaginally).  CT scan.  MRI.  Laparoscopy. In this procedure, a lighted, pencil-sized instrument called a laparoscope is inserted into your abdomen through an incision. The laparoscope allows your health care provider to look at the organs inside your body and check for abnormal tissue to confirm the diagnosis. If abnormal tissue is found, your health care provider may remove a small piece of tissue (biopsy) to be examined under a microscope.  How is this treated? Treatment for this condition may include:  Medicines to relieve pain, such as NSAIDs.  Hormone therapy. This involves using artificial (synthetic) hormones to reduce endometrial tissue growth. Your health care provider may recommend using a hormonal form of birth control, or other medicines.  Surgery. This may be done to remove abnormal endometrial tissue. ? In some cases, tissue may be removed using  a laparoscope and a laser (laparoscopic laser treatment). ? In severe cases, surgery may be done to remove the fallopian tubes, uterus, and ovaries (hysterectomy).  Follow these instructions at home:  Take over-the-counter and prescription medicines only as told by your health care provider.  Do not drive or use heavy machinery while taking prescription pain medicine.  Try to avoid activities that cause pain, including sexual activity.  Keep all follow-up visits as told by your health care provider. This is important. Contact a health care provider if:  You have pain in the area between your hip bones (pelvic area) that occurs: ? Before, during, or after your period. ? In between your period and gets worse during your period. ? During or after sex. ? With bowel movements or urination, especially during your period.  You have problems getting pregnant.  You have a fever. Get help right away if:  You have severe pain that does not get better with medicine.  You have severe nausea and vomiting, or you cannot eat without vomiting.  You have pain that affects only the lower, right side of your abdomen.  You have abdominal pain that gets worse.  You have abdominal swelling.  You have blood in your stool. This information is not intended to  replace advice given to you by your health care provider. Make sure you discuss any questions you have with your health care provider. Document Released: 07/26/2000 Document Revised: 05/03/2016 Document Reviewed: 12/30/2015 Elsevier Interactive Patient Education  [employer].

## 2018-04-22 ENCOUNTER — Telehealth: Payer: Self-pay | Admitting: Obstetrics and Gynecology

## 2018-04-22 NOTE — Telephone Encounter (Signed)
He patient called and stated that she would like a call back from her nurse or provider if possible. Please advise.

## 2018-04-22 NOTE — Telephone Encounter (Signed)
Pt is requesting her surgery to be moved up. She states she can not stand this pain for 2 weeks.  Currently it is scheduled for 9/30. Pt aware [place] is out today. Will send message to [facility].

## 2018-04-23 LAB — URINE CULTURE: Organism ID, Bacteria: NO GROWTH

## 2018-04-23 NOTE — Telephone Encounter (Signed)
I spoke with the patient. She states tylenol and ibprofen are working to control the pain. She would like to keep her surgery on 9/30 as she had already made arrangements at work.

## 2018-04-23 NOTE — Telephone Encounter (Signed)
Noted  

## 2018-04-27 LAB — CYTOLOGY - PAP
Diagnosis: NEGATIVE
HPV: NOT DETECTED

## 2018-04-30 ENCOUNTER — Ambulatory Visit (INDEPENDENT_AMBULATORY_CARE_PROVIDER_SITE_OTHER): Payer: BLUE CROSS/BLUE SHIELD | Admitting: Obstetrics and Gynecology

## 2018-04-30 ENCOUNTER — Encounter: Payer: Self-pay | Admitting: Obstetrics and Gynecology

## 2018-04-30 VITALS — BP 106/74 | HR 84 | Ht 64.0 in | Wt 150.5 lb

## 2018-04-30 DIAGNOSIS — R102 Pelvic and perineal pain: Secondary | ICD-10-CM

## 2018-04-30 DIAGNOSIS — Z01818 Encounter for other preprocedural examination: Secondary | ICD-10-CM

## 2018-04-30 DIAGNOSIS — Z9889 Other specified postprocedural states: Secondary | ICD-10-CM

## 2018-04-30 NOTE — Patient Instructions (Signed)
1.  Return for postop check 1 week after surgery as scheduled

## 2018-04-30 NOTE — H&P (Signed)
PREOPERATIVE HISTORY AND PHYSICAL  Date of surgery: 05/11/2018 Procedure: Laparoscopy with peritoneal biopsies; excision and fulguration of endometriosis Diagnosis: 1.  Chronic pelvic pain   Patient is a 37 y.o. G2P2030female scheduled for surgery on 05/11/2018. Contraception: None Status post endometrial ablation 2015 with subsequent amenorrhea. Recent history of persistent UTI symptoms without evidence of UTI.  Multiple ER visits prompted work-up including CT scan and ultrasound of the pelvis. CT scan: Nonobstructive bilateral renal stones; essentially normal pelvis without acute changes. Ultrasound: Multi-fibroid uterus with 2 fibroids measuring approximate 1.5 cm; left ovarian cyst measuring 1.5 cm; no free fluid Patient has been given Percocet for her pain.  Past OB history: SVD x2; 7 pound 14 ounce female, and 9 pound 3 ounce female  Past GYN history: Menarche age 74. Status post endometrial ablation in 2015 by Dr. Serita Kyle. Amenorrheic at this time. No vasomotor symptoms. Prior to endometrial ablation menstrual cycles were monthly. History of dysmenorrhea that was treated with Midol and Pamprin; she described her pelvic pain is central cramping without back pain or radiation into the buttocks or thighs. She reported dysmenorrhea with heavy bleeding for 7 days. No family history of endometriosis other than 2 paternal cousins having endometriosis. 2013 laparoscopic ovarian cystectomy was performed. Patient denies deep thrusting dyspareunia. Patient denies history of STDs; both she and her partner in March 2019 was screened for STDs Patient denies history of abnormal Pap smear since age 89; no recent Pap smear Patient states that she has had long history of heavy bleeding; it did not respond to Depo-Provera after first year of use; Mirena IUD was unsuccessful in controlling bleeding; Nexplanon was unsuccessful in controlling bleeding.  She states that she did use birth control pills in  the past which seemed to regulate her bleeding somewhat.  Patient has no history of chronic pelvic back pain which is cyclic approximately once or twice a month; the pain is in her lumbosacral region with radiation to her left leg; for this pain she takes approximately 12 ibuprofen tablets a day.   Gynecologic History No LMP recorded (lmp unknown). Patient has had an ablation. Contraception: None Status post endometrial ablation 2015  OB History    Gravida  2   Para  2   Term  2   Preterm      AB      Living  2     SAB      TAB      Ectopic      Multiple      Live Births  2           Menarche age: 42 No LMP recorded (lmp unknown). Patient has had an ablation.    Past Medical History:  Diagnosis Date  . Breast mass 2018   bil masses felt by MD  . Frequent headaches   . Kidney stones     Past Surgical History:  Procedure Laterality Date  . ablation     ut  . APPENDECTOMY  2012  . CHOLECYSTECTOMY  2002  . TONSILLECTOMY  2010    OB History  Gravida Para Term Preterm AB Living  2 2 2     2   SAB TAB Ectopic Multiple Live Births          2    # Outcome Date GA Lbr Len/2nd Weight Sex Delivery Anes PTL Lv  2 Term 2012   9 lb 1.9 oz (4.137 kg) M Vag-Spont   LIV  1 Term 2002  7 lb 2.2 oz (3.239 kg) F Vag-Spont   LIV    Social History   Socioeconomic History  . Marital status: Single    Spouse name: Not on file  . Number of children: Not on file  . Years of education: Not on file  . Highest education level: Not on file  Occupational History  . Not on file  Social Needs  . Financial resource strain: Not on file  . Food insecurity:    Worry: Not on file    Inability: Not on file  . Transportation needs:    Medical: Not on file    Non-medical: Not on file  Tobacco Use  . Smoking status: Former Smoker    Packs/day: 0.50    Years: 18.00    Pack years: 9.00    Types: Cigarettes    Last attempt to quit: 04/08/2017    Years since quitting:  1.0  . Smokeless tobacco: Never Used  Substance and Sexual Activity  . Alcohol use: Yes    Comment: occasional  . Drug use: No  . Sexual activity: Yes    Comment: ablation  Lifestyle  . Physical activity:    Days per week: Not on file    Minutes per session: Not on file  . Stress: Not on file  Relationships  . Social connections:    Talks on phone: Not on file    Gets together: Not on file    Attends religious service: Not on file    Active member of club or organization: Not on file    Attends meetings of clubs or organizations: Not on file    Relationship status: Not on file  Other Topics Concern  . Not on file  Social History Narrative  . Not on file    Family History  Problem Relation Age of Onset  . Hypertension Mother   . Kidney disease Mother   . Hyperlipidemia Mother   . Breast cancer Neg Hx   . Ovarian cancer Neg Hx   . Colon cancer Neg Hx      (Not in a hospital admission)  No Known Allergies  Review of Systems Constitutional: No recent fever/chills/sweats Respiratory: No recent cough/bronchitis Cardiovascular: No chest pain Gastrointestinal: No recent nausea/vomiting/diarrhea Genitourinary: No UTI symptoms Hematologic/lymphatic:No history of coagulopathy or recent blood thinner use    Objective:    BP 106/74   Pulse 84   Ht 5\' 4"  (1.626 m)   Wt 150 lb 8 oz (68.3 kg)   LMP  (LMP Unknown)   BMI 25.83 kg/m   General:   Normal  Skin:   normal  HEENT:  Normal  Neck:  Supple without Adenopathy or Thyromegaly  Lungs:   Heart:              Breasts:   Abdomen:  Pelvis:  M/S   Extremeties:  Neuro:    clear to auscultation bilaterally   Normal without murmur   Not Examined   soft, non-tender; bowel sounds normal; no masses,  no organomegaly   Exam deferred to OR  No CVAT  Warm/Dry   Normal        9/10/2019PELVIC:             External Genitalia: Normal             BUS: Normal             Vagina: Normal; frothy white discharge  present; anterior vaginal wall is tender  Cervix:  no lesions; cervical motion tenderness 6/10             Uterus: Top normal size, shape,consistency, mobile, tender 6/10             Adnexa: Nonpalpable; right side nontender; left side tender 6/10             RV: Normal external exam             bladder: Tender  Assessment:    1.  Pelvic pain 2.  History of endometrial ablation 3.  Unstable bladder symptoms, possibly consistent with interstitial cystitis or endometriosis 4.  Uterine fibroids 5.  Left ovarian cyst  Symptom complex is very suspicious for endometriosis/adenomyosis.  Other potential etiologies include interstitial cystitis of the bladder.  Does not appear that she actually ever had a UTI as there was no positive urine culture.  She does have kidney stones but they are non-obstructing and likely asymptomatic.  It is doubtful that the uterine fibroids are symptomatic. It is doubtful that the ovarian cyst would be symptomatic unless it is associated with endometriosis.   Plan:    Laparoscopy with peritoneal biopsies, possible excision and fulguration of endometriosis.  Patient potentially could have implants of endometriosis along the left uterosacral ligament as well as bladder flap.  Preop counseling: Patient is to undergo laparoscopy with peritoneal biopsies and possible excision and fulguration of endometriosis on 05/11/2018.  She is understanding of the planned procedures and is aware of and is accepting of all surgical risks which include but are not limited to bleeding, infection, pelvic organ injury with need for repair, blood clot disorders, anesthesia risk, etc.  All questions have been answered.  Informed consent is given.  Patient is ready willing to proceed with surgery as scheduled.  Brayton Mars, MD  Note: This dictation was prepared with Dragon dictation along with smaller phrase technology. Any transcriptional errors that result from this process  are unintentional.

## 2018-04-30 NOTE — Progress Notes (Signed)
PREOPERATIVE HISTORY AND PHYSICAL  Date of surgery: 05/11/2018 Procedure: Laparoscopy with peritoneal biopsies; excision and fulguration of endometriosis Diagnosis: 1.  Chronic pelvic pain   Patient is a 37 y.o. G2P2058female scheduled for surgery on 05/11/2018. Contraception: None Status post endometrial ablation 2015 with subsequent amenorrhea. Recent history of persistent UTI symptoms without evidence of UTI.  Multiple ER visits prompted work-up including CT scan and ultrasound of the pelvis. CT scan: Nonobstructive bilateral renal stones; essentially normal pelvis without acute changes. Ultrasound: Multi-fibroid uterus with 2 fibroids measuring approximate 1.5 cm; left ovarian cyst measuring 1.5 cm; no free fluid Patient has been given Percocet for her pain.  Past OB history: SVD x2; 7 pound 14 ounce female, and 9 pound 3 ounce female  Past GYN history: Menarche age 25. Status post endometrial ablation in 2015 by Dr. Serita Kyle. Amenorrheic at this time. No vasomotor symptoms. Prior to endometrial ablation menstrual cycles were monthly. History of dysmenorrhea that was treated with Midol and Pamprin; she described her pelvic pain is central cramping without back pain or radiation into the buttocks or thighs. She reported dysmenorrhea with heavy bleeding for 7 days. No family history of endometriosis other than 2 paternal cousins having endometriosis. 2013 laparoscopic ovarian cystectomy was performed. Patient denies deep thrusting dyspareunia. Patient denies history of STDs; both she and her partner in March 2019 was screened for STDs Patient denies history of abnormal Pap smear since age 22; no recent Pap smear Patient states that she has had long history of heavy bleeding; it did not respond to Depo-Provera after first year of use; Mirena IUD was unsuccessful in controlling bleeding; Nexplanon was unsuccessful in controlling bleeding.  She states that she did use birth control pills in  the past which seemed to regulate her bleeding somewhat.  Patient has no history of chronic pelvic back pain which is cyclic approximately once or twice a month; the pain is in her lumbosacral region with radiation to her left leg; for this pain she takes approximately 12 ibuprofen tablets a day.   Gynecologic History No LMP recorded (lmp unknown). Patient has had an ablation. Contraception: None Status post endometrial ablation 2015  OB History    Gravida  2   Para  2   Term  2   Preterm      AB      Living  2     SAB      TAB      Ectopic      Multiple      Live Births  2           Menarche age: 40 No LMP recorded (lmp unknown). Patient has had an ablation.    Past Medical History:  Diagnosis Date  . Breast mass 2018   bil masses felt by MD  . Frequent headaches   . Kidney stones     Past Surgical History:  Procedure Laterality Date  . ablation     ut  . APPENDECTOMY  2012  . CHOLECYSTECTOMY  2002  . TONSILLECTOMY  2010    OB History  Gravida Para Term Preterm AB Living  2 2 2     2   SAB TAB Ectopic Multiple Live Births          2    # Outcome Date GA Lbr Len/2nd Weight Sex Delivery Anes PTL Lv  2 Term 2012   9 lb 1.9 oz (4.137 kg) M Vag-Spont   LIV  1 Term 2002  7 lb 2.2 oz (3.239 kg) F Vag-Spont   LIV    Social History   Socioeconomic History  . Marital status: Single    Spouse name: Not on file  . Number of children: Not on file  . Years of education: Not on file  . Highest education level: Not on file  Occupational History  . Not on file  Social Needs  . Financial resource strain: Not on file  . Food insecurity:    Worry: Not on file    Inability: Not on file  . Transportation needs:    Medical: Not on file    Non-medical: Not on file  Tobacco Use  . Smoking status: Former Smoker    Packs/day: 0.50    Years: 18.00    Pack years: 9.00    Types: Cigarettes    Last attempt to quit: 04/08/2017    Years since quitting:  1.0  . Smokeless tobacco: Never Used  Substance and Sexual Activity  . Alcohol use: Yes    Comment: occasional  . Drug use: No  . Sexual activity: Yes    Comment: ablation  Lifestyle  . Physical activity:    Days per week: Not on file    Minutes per session: Not on file  . Stress: Not on file  Relationships  . Social connections:    Talks on phone: Not on file    Gets together: Not on file    Attends religious service: Not on file    Active member of club or organization: Not on file    Attends meetings of clubs or organizations: Not on file    Relationship status: Not on file  Other Topics Concern  . Not on file  Social History Narrative  . Not on file    Family History  Problem Relation Age of Onset  . Hypertension Mother   . Kidney disease Mother   . Hyperlipidemia Mother   . Breast cancer Neg Hx   . Ovarian cancer Neg Hx   . Colon cancer Neg Hx      (Not in a hospital admission)  No Known Allergies  Review of Systems Constitutional: No recent fever/chills/sweats Respiratory: No recent cough/bronchitis Cardiovascular: No chest pain Gastrointestinal: No recent nausea/vomiting/diarrhea Genitourinary: No UTI symptoms Hematologic/lymphatic:No history of coagulopathy or recent blood thinner use    Objective:    BP 106/74   Pulse 84   Ht 5\' 4"  (1.626 m)   Wt 150 lb 8 oz (68.3 kg)   LMP  (LMP Unknown)   BMI 25.83 kg/m   General:   Normal  Skin:   normal  HEENT:  Normal  Neck:  Supple without Adenopathy or Thyromegaly  Lungs:   Heart:              Breasts:   Abdomen:  Pelvis:  M/S   Extremeties:  Neuro:    clear to auscultation bilaterally   Normal without murmur   Not Examined   soft, non-tender; bowel sounds normal; no masses,  no organomegaly   Exam deferred to OR  No CVAT  Warm/Dry   Normal        9/10/2019PELVIC:             External Genitalia: Normal             BUS: Normal             Vagina: Normal; frothy white discharge  present; anterior vaginal wall is tender  Cervix:  no lesions; cervical motion tenderness 6/10             Uterus: Top normal size, shape,consistency, mobile, tender 6/10             Adnexa: Nonpalpable; right side nontender; left side tender 6/10             RV: Normal external exam             bladder: Tender  Assessment:    1.  Pelvic pain 2.  History of endometrial ablation 3.  Unstable bladder symptoms, possibly consistent with interstitial cystitis or endometriosis 4.  Uterine fibroids 5.  Left ovarian cyst  Symptom complex is very suspicious for endometriosis/adenomyosis.  Other potential etiologies include interstitial cystitis of the bladder.  Does not appear that she actually ever had a UTI as there was no positive urine culture.  She does have kidney stones but they are non-obstructing and likely asymptomatic.  It is doubtful that the uterine fibroids are symptomatic. It is doubtful that the ovarian cyst would be symptomatic unless it is associated with endometriosis.   Plan:    Laparoscopy with peritoneal biopsies, possible excision and fulguration of endometriosis.  Patient potentially could have implants of endometriosis along the left uterosacral ligament as well as bladder flap.  Preop counseling: Patient is to undergo laparoscopy with peritoneal biopsies and possible excision and fulguration of endometriosis on 05/11/2018.  She is understanding of the planned procedures and is aware of and is accepting of all surgical risks which include but are not limited to bleeding, infection, pelvic organ injury with need for repair, blood clot disorders, anesthesia risk, etc.  All questions have been answered.  Informed consent is given.  Patient is ready willing to proceed with surgery as scheduled.  Brayton Mars, MD  Note: This dictation was prepared with Dragon dictation along with smaller phrase technology. Any transcriptional errors that result from this process  are unintentional.

## 2018-05-05 ENCOUNTER — Other Ambulatory Visit: Payer: Self-pay

## 2018-05-05 ENCOUNTER — Encounter
Admission: RE | Admit: 2018-05-05 | Discharge: 2018-05-05 | Disposition: A | Discharge status: Home / Self Care | Payer: BLUE CROSS/BLUE SHIELD | Source: Ambulatory Visit | Attending: Obstetrics and Gynecology | Admitting: Obstetrics and Gynecology

## 2018-05-05 DIAGNOSIS — Z01812 Encounter for preprocedural laboratory examination: Secondary | ICD-10-CM | POA: Insufficient documentation

## 2018-05-05 HISTORY — DX: Anemia, unspecified: D64.9

## 2018-05-05 HISTORY — DX: Unspecified convulsions: R56.9

## 2018-05-05 HISTORY — DX: Personal history of urinary calculi: Z87.442

## 2018-05-05 LAB — CBC WITH DIFFERENTIAL/PLATELET
Basophils Absolute: 0 10*3/uL (ref 0–0.1)
Basophils Relative: 1 %
Eosinophils Absolute: 0.1 10*3/uL (ref 0–0.7)
Eosinophils Relative: 2 %
HCT: 39.8 % (ref 35.0–47.0)
Hemoglobin: 14.1 g/dL (ref 12.0–16.0)
Lymphocytes Relative: 42 %
Lymphs Abs: 1.8 10*3/uL (ref 1.0–3.6)
MCH: 33 pg (ref 26.0–34.0)
MCHC: 35.3 g/dL (ref 32.0–36.0)
MCV: 93.4 fL (ref 80.0–100.0)
Monocytes Absolute: 0.3 10*3/uL (ref 0.2–0.9)
Monocytes Relative: 7 %
Neutro Abs: 2 10*3/uL (ref 1.4–6.5)
Neutrophils Relative %: 48 %
Platelets: 150 10*3/uL (ref 150–440)
RBC: 4.27 MIL/uL (ref 3.80–5.20)
RDW: 12.9 % (ref 11.5–14.5)
WBC: 4.2 10*3/uL (ref 3.6–11.0)

## 2018-05-05 LAB — TYPE AND SCREEN
ABO/RH(D): A POS
Antibody Screen: NEGATIVE

## 2018-05-05 NOTE — Patient Instructions (Signed)
Your procedure is scheduled on: 05-11-18 MONDAY Report to Same Day Surgery 2nd floor medical mall [facility] Entrance-take elevator on left to 2nd floor.  Check in with surgery information desk.) To find out your arrival time please call [phone] between 1PM - 3PM on 05-08-18 FRIDAY  Remember: Instructions that are not followed completely may result in serious medical risk, up to and including death, or upon the discretion of your surgeon and anesthesiologist your surgery may need to be rescheduled.    _x___ 1. Do not eat food after midnight the night before your procedure. NO GUM OR CANDY AFTER MIDNIGHT.  You may drink clear liquids up to 2 hours before you are scheduled to arrive at the hospital for your procedure.  Do not drink clear liquids within 2 hours of your scheduled arrival to the hospital.  Clear liquids include  --Water or Apple juice without pulp  --Clear carbohydrate beverage such as ClearFast or Gatorade  --Black Coffee or Clear Tea (No milk, no creamers, do not add anything to the coffee or Tea   ____Ensure clear carbohydrate drink on the way to the hospital for bariatric patients  ____Ensure clear carbohydrate drink 3 hours before surgery for Dr Dwyane Luo patients if physician instructed.    __x__ 2. No Alcohol for 24 hours before or after surgery.   __x__3. No Smoking or e-cigarettes for 24 prior to surgery.  Do not use any chewable tobacco products for at least 6 hour prior to surgery   ____  4. Bring all medications with you on the day of surgery if instructed.    __x__ 5. Notify your doctor if there is any change in your medical condition     (cold, fever, infections).    x___6. On the morning of surgery brush your teeth with toothpaste and water.  You may rinse your mouth with mouth wash if you wish.  Do not swallow any toothpaste or mouthwash.   Do not wear jewelry, make-up, hairpins, clips or nail polish.  Do not wear lotions, powders, or perfumes. You  may wear deodorant.  Do not shave 48 hours prior to surgery. Men may shave face and neck.  Do not bring valuables to the hospital.    [facility] is not responsible for any belongings or valuables.               Contacts, dentures or bridgework may not be worn into surgery.  Leave your suitcase in the car. After surgery it may be brought to your room.  For patients admitted to the hospital, discharge time is determined by your treatment team.  _  Patients discharged the day of surgery will not be allowed to drive home.  You will need someone to drive you home and stay with you the night of your procedure.    Please read over the following fact sheets that you were given:   [facility] Preparing for Surgery   ____ Take anti-hypertensive listed below, cardiac, seizure, asthma, anti-reflux and psychiatric medicines. These include:  1. NONE  2.  3.  4.  5.  6.  ____Fleets enema or Magnesium Citrate as directed.   _x___ Use CHG Soap or sage wipes as directed on instruction sheet   ____ Use inhalers on the day of surgery and bring to hospital day of surgery  ____ Stop Metformin and Janumet 2 days prior to surgery.    ____ Take 1/2 of usual insulin dose the night before surgery and none on  the morning surgery.   ____ Follow recommendations from Cardiologist, Pulmonologist or PCP regarding stopping Aspirin, Coumadin, Plavix ,Eliquis, Effient, or Pradaxa, and Pletal.  X____Stop Anti-inflammatories such as Advil, Aleve, Ibuprofen, Motrin, Naproxen, Naprosyn, Goodies powders or aspirin products NOW-OK to take Tylenol    _x___ Stop supplements until after surgery-STOP PHENTERMINE NOW-MAY RESUME AFTER SURGERY   ____ Bring C-Pap to the hospital.

## 2018-05-06 LAB — RPR: RPR Ser Ql: NONREACTIVE

## 2018-05-07 LAB — RNA QUALITATIVE: HIV 1 RNA Qualitative: 1

## 2018-05-07 LAB — HIV-1/2 AB - DIFFERENTIATION
HIV 1 Ab: NEGATIVE
HIV 2 Ab: NEGATIVE
Note: NEGATIVE

## 2018-05-08 ENCOUNTER — Encounter: Payer: Self-pay | Admitting: *Deleted

## 2018-05-11 ENCOUNTER — Ambulatory Visit
Admission: RE | Admit: 2018-05-11 | Discharge: 2018-05-11 | Disposition: A | Discharge status: Home / Self Care | Payer: BLUE CROSS/BLUE SHIELD | Source: Ambulatory Visit | Attending: Obstetrics and Gynecology | Admitting: Obstetrics and Gynecology

## 2018-05-11 ENCOUNTER — Ambulatory Visit: Payer: BLUE CROSS/BLUE SHIELD | Admitting: Certified Registered Nurse Anesthetist

## 2018-05-11 ENCOUNTER — Other Ambulatory Visit: Payer: Self-pay

## 2018-05-11 ENCOUNTER — Encounter: Payer: Self-pay | Admitting: Anesthesiology

## 2018-05-11 ENCOUNTER — Encounter
Admission: RE | Disposition: A | Discharge status: Home / Self Care | Payer: Self-pay | Source: Ambulatory Visit | Attending: Obstetrics and Gynecology

## 2018-05-11 DIAGNOSIS — N83292 Other ovarian cyst, left side: Secondary | ICD-10-CM | POA: Insufficient documentation

## 2018-05-11 DIAGNOSIS — Z9889 Other specified postprocedural states: Secondary | ICD-10-CM

## 2018-05-11 DIAGNOSIS — Z87891 Personal history of nicotine dependence: Secondary | ICD-10-CM | POA: Diagnosis not present

## 2018-05-11 DIAGNOSIS — N898 Other specified noninflammatory disorders of vagina: Secondary | ICD-10-CM | POA: Diagnosis not present

## 2018-05-11 DIAGNOSIS — N838 Other noninflammatory disorders of ovary, fallopian tube and broad ligament: Secondary | ICD-10-CM | POA: Insufficient documentation

## 2018-05-11 DIAGNOSIS — N8311 Corpus luteum cyst of right ovary: Secondary | ICD-10-CM | POA: Insufficient documentation

## 2018-05-11 DIAGNOSIS — G8929 Other chronic pain: Secondary | ICD-10-CM | POA: Diagnosis not present

## 2018-05-11 DIAGNOSIS — R102 Pelvic and perineal pain: Secondary | ICD-10-CM | POA: Diagnosis not present

## 2018-05-11 DIAGNOSIS — N736 Female pelvic peritoneal adhesions (postinfective): Secondary | ICD-10-CM | POA: Diagnosis not present

## 2018-05-11 DIAGNOSIS — N7012 Chronic oophoritis: Secondary | ICD-10-CM | POA: Diagnosis not present

## 2018-05-11 DIAGNOSIS — D259 Leiomyoma of uterus, unspecified: Secondary | ICD-10-CM | POA: Diagnosis not present

## 2018-05-11 DIAGNOSIS — N912 Amenorrhea, unspecified: Secondary | ICD-10-CM | POA: Diagnosis not present

## 2018-05-11 HISTORY — PX: LAPAROSCOPY: SHX197

## 2018-05-11 HISTORY — PX: LAPAROSCOPIC LYSIS OF ADHESIONS: SHX5905

## 2018-05-11 LAB — ABO/RH: ABO/RH(D): A POS

## 2018-05-11 LAB — POCT PREGNANCY, URINE: Preg Test, Ur: NEGATIVE

## 2018-05-11 SURGERY — LAPAROSCOPY, DIAGNOSTIC
Anesthesia: General

## 2018-05-11 MED ORDER — MIDAZOLAM HCL 2 MG/2ML IJ SOLN
INTRAMUSCULAR | Status: DC | PRN
  Administered 2018-05-11: 2 mg via INTRAVENOUS

## 2018-05-11 MED ORDER — BUPIVACAINE HCL (PF) 0.5 % IJ SOLN
INTRAMUSCULAR | Status: AC
  Filled 2018-05-11: qty 30

## 2018-05-11 MED ORDER — ACETAMINOPHEN 10 MG/ML IV SOLN
INTRAVENOUS | Status: DC | PRN
  Administered 2018-05-11: 1000 mg via INTRAVENOUS

## 2018-05-11 MED ORDER — FENTANYL CITRATE (PF) 100 MCG/2ML IJ SOLN
INTRAMUSCULAR | Status: AC
  Filled 2018-05-11: qty 2

## 2018-05-11 MED ORDER — FENTANYL CITRATE (PF) 100 MCG/2ML IJ SOLN
25.0000 ug | INTRAMUSCULAR | Status: DC | PRN
  Administered 2018-05-11 (×2): 25 ug via INTRAVENOUS

## 2018-05-11 MED ORDER — DEXAMETHASONE SODIUM PHOSPHATE 10 MG/ML IJ SOLN
INTRAMUSCULAR | Status: AC
  Filled 2018-05-11: qty 1

## 2018-05-11 MED ORDER — LIDOCAINE HCL (PF) 2 % IJ SOLN
INTRAMUSCULAR | Status: AC
  Filled 2018-05-11: qty 10

## 2018-05-11 MED ORDER — LIDOCAINE HCL (CARDIAC) PF 100 MG/5ML IV SOSY
PREFILLED_SYRINGE | INTRAVENOUS | Status: DC | PRN
  Administered 2018-05-11: 100 mg via INTRAVENOUS

## 2018-05-11 MED ORDER — FENTANYL CITRATE (PF) 100 MCG/2ML IJ SOLN
INTRAMUSCULAR | Status: DC | PRN
  Administered 2018-05-11: 25 ug via INTRAVENOUS
  Administered 2018-05-11: 50 ug via INTRAVENOUS
  Administered 2018-05-11: 25 ug via INTRAVENOUS
  Administered 2018-05-11 (×2): 50 ug via INTRAVENOUS

## 2018-05-11 MED ORDER — ACETAMINOPHEN NICU IV SYRINGE 10 MG/ML
INTRAVENOUS | Status: AC
  Filled 2018-05-11: qty 1

## 2018-05-11 MED ORDER — HYDROMORPHONE HCL 2 MG PO TABS: 2.0000 mg | ORAL_TABLET | Freq: Four times a day (QID) | ORAL | 0 refills | Status: DC | PRN

## 2018-05-11 MED ORDER — ONDANSETRON HCL 4 MG/2ML IJ SOLN: 4.0000 mg | Freq: Once | INTRAMUSCULAR | Status: DC | PRN

## 2018-05-11 MED ORDER — LACTATED RINGERS IV SOLN
INTRAVENOUS | Status: DC
  Administered 2018-05-11: 12:00:00 via INTRAVENOUS

## 2018-05-11 MED ORDER — KETOROLAC TROMETHAMINE 30 MG/ML IJ SOLN
INTRAMUSCULAR | Status: AC
  Filled 2018-05-11: qty 1

## 2018-05-11 MED ORDER — ROCURONIUM BROMIDE 50 MG/5ML IV SOLN
INTRAVENOUS | Status: AC
  Filled 2018-05-11: qty 1

## 2018-05-11 MED ORDER — SEVOFLURANE IN SOLN
RESPIRATORY_TRACT | Status: AC
  Filled 2018-05-11: qty 250

## 2018-05-11 MED ORDER — ROCURONIUM BROMIDE 100 MG/10ML IV SOLN
INTRAVENOUS | Status: DC | PRN
  Administered 2018-05-11: 5 mg via INTRAVENOUS
  Administered 2018-05-11: 40 mg via INTRAVENOUS

## 2018-05-11 MED ORDER — MIDAZOLAM HCL 2 MG/2ML IJ SOLN
INTRAMUSCULAR | Status: AC
  Filled 2018-05-11: qty 2

## 2018-05-11 MED ORDER — ONDANSETRON HCL 4 MG/2ML IJ SOLN
INTRAMUSCULAR | Status: DC | PRN
  Administered 2018-05-11: 4 mg via INTRAVENOUS

## 2018-05-11 MED ORDER — DEXAMETHASONE SODIUM PHOSPHATE 10 MG/ML IJ SOLN
INTRAMUSCULAR | Status: DC | PRN
  Administered 2018-05-11: 10 mg via INTRAVENOUS

## 2018-05-11 MED ORDER — SUGAMMADEX SODIUM 200 MG/2ML IV SOLN
INTRAVENOUS | Status: AC
  Filled 2018-05-11: qty 2

## 2018-05-11 MED ORDER — PROPOFOL 10 MG/ML IV BOLUS
INTRAVENOUS | Status: AC
  Filled 2018-05-11: qty 20

## 2018-05-11 MED ORDER — SUGAMMADEX SODIUM 200 MG/2ML IV SOLN
INTRAVENOUS | Status: DC | PRN
  Administered 2018-05-11: 135.2 mg via INTRAVENOUS

## 2018-05-11 MED ORDER — HYDROMORPHONE HCL 2 MG PO TABS
2.0000 mg | ORAL_TABLET | Freq: Four times a day (QID) | ORAL | Status: DC | PRN
  Administered 2018-05-11: 2 mg via ORAL
  Filled 2018-05-11: qty 1

## 2018-05-11 MED ORDER — LACTATED RINGERS IV SOLN: INTRAVENOUS | Status: DC

## 2018-05-11 MED ORDER — ONDANSETRON HCL 4 MG/2ML IJ SOLN
INTRAMUSCULAR | Status: AC
  Filled 2018-05-11: qty 2

## 2018-05-11 MED ORDER — FAMOTIDINE 20 MG PO TABS
20.0000 mg | ORAL_TABLET | Freq: Once | ORAL | Status: AC
  Administered 2018-05-11: 20 mg via ORAL

## 2018-05-11 MED ORDER — BUPIVACAINE-EPINEPHRINE (PF) 0.5% -1:200000 IJ SOLN
INTRAMUSCULAR | Status: AC
  Filled 2018-05-11: qty 30

## 2018-05-11 MED ORDER — FAMOTIDINE 20 MG PO TABS
ORAL_TABLET | ORAL | Status: AC
  Filled 2018-05-11: qty 1

## 2018-05-11 MED ORDER — PROPOFOL 10 MG/ML IV BOLUS
INTRAVENOUS | Status: DC | PRN
  Administered 2018-05-11: 130 mg via INTRAVENOUS

## 2018-05-11 MED ORDER — HYDROMORPHONE HCL 2 MG PO TABS
ORAL_TABLET | ORAL | Status: AC
  Filled 2018-05-11: qty 1

## 2018-05-11 MED ORDER — KETOROLAC TROMETHAMINE 30 MG/ML IJ SOLN
INTRAMUSCULAR | Status: DC | PRN
  Administered 2018-05-11: 30 mg via INTRAVENOUS

## 2018-05-11 MED ORDER — FENTANYL CITRATE (PF) 100 MCG/2ML IJ SOLN
INTRAMUSCULAR | Status: AC
  Administered 2018-05-11: 25 ug via INTRAVENOUS
  Filled 2018-05-11: qty 2

## 2018-05-11 MED ORDER — ACETAMINOPHEN 500 MG PO TABS: 1000.0000 mg | ORAL_TABLET | Freq: Four times a day (QID) | ORAL | 0 refills | Status: DC

## 2018-05-11 SURGICAL SUPPLY — 32 items
BLADE SURG SZ11 CARB STEEL (BLADE) ×4 IMPLANT
CANISTER SUCT 1200ML W/VALVE (MISCELLANEOUS) ×4 IMPLANT
CATH ROBINSON RED A/P 16FR (CATHETERS) ×4 IMPLANT
CHLORAPREP W/TINT 26ML (MISCELLANEOUS) ×4 IMPLANT
DERMABOND ADVANCED (GAUZE/BANDAGES/DRESSINGS) ×2
DERMABOND ADVANCED .7 DNX12 (GAUZE/BANDAGES/DRESSINGS) ×2 IMPLANT
GLOVE BIO SURGEON STRL SZ8 (GLOVE) ×12 IMPLANT
GLOVE INDICATOR 8.0 STRL GRN (GLOVE) ×12 IMPLANT
GOWN STRL REUS W/ TWL LRG LVL3 (GOWN DISPOSABLE) ×2 IMPLANT
GOWN STRL REUS W/ TWL XL LVL3 (GOWN DISPOSABLE) ×2 IMPLANT
GOWN STRL REUS W/TWL LRG LVL3 (GOWN DISPOSABLE) ×2
GOWN STRL REUS W/TWL XL LVL3 (GOWN DISPOSABLE) ×2
IRRIGATION STRYKERFLOW (MISCELLANEOUS) IMPLANT
IRRIGATOR STRYKERFLOW (MISCELLANEOUS)
IV LACTATED RINGERS 1000ML (IV SOLUTION) ×4 IMPLANT
KIT PINK PAD W/HEAD ARE REST (MISCELLANEOUS) ×4
KIT PINK PAD W/HEAD ARM REST (MISCELLANEOUS) ×2 IMPLANT
KIT TURNOVER CYSTO (KITS) IMPLANT
LABEL OR SOLS (LABEL) IMPLANT
NS IRRIG 1000ML POUR BTL (IV SOLUTION) IMPLANT
NS IRRIG 500ML POUR BTL (IV SOLUTION) ×4 IMPLANT
PACK GYN LAPAROSCOPIC (MISCELLANEOUS) ×4 IMPLANT
PAD OB MATERNITY 4.3X12.25 (PERSONAL CARE ITEMS) ×4 IMPLANT
PAD PREP 24X41 OB/GYN DISP (PERSONAL CARE ITEMS) ×4 IMPLANT
POUCH ENDO CATCH 10MM SPEC (MISCELLANEOUS) IMPLANT
SCISSORS METZENBAUM CVD 33 (INSTRUMENTS) ×4 IMPLANT
SLEEVE ENDOPATH XCEL 5M (ENDOMECHANICALS) ×8 IMPLANT
SUT PLAIN 4 0 FS 2 27 (SUTURE) IMPLANT
SUT VIC AB 0 CT2 27 (SUTURE) IMPLANT
SUT VIC AB 0 UR5 27 (SUTURE) ×4 IMPLANT
TROCAR XCEL NON-BLD 5MMX100MML (ENDOMECHANICALS) ×4 IMPLANT
TUBING INSUFFLATION (TUBING) ×4 IMPLANT

## 2018-05-11 NOTE — Anesthesia Post-op Follow-up Note (Signed)
Anesthesia QCDR form completed.        

## 2018-05-11 NOTE — Interval H&P Note (Signed)
History and Physical Interval Note:  05/11/2018 11:39 AM  Diane Roberts  has presented today for surgery, with the diagnosis of PELVIC PAIN  The various methods of treatment have been discussed with the patient and family. After consideration of risks, benefits and other options for treatment, the patient has consented to  Procedure(s): LAPAROSCOPY DIAGNOSTIC WITH BIOPSIES (N/A) AND EXCISION AND FULGURATION OF ENDOMETRIOSIS as a surgical intervention .  The patient's history has been reviewed, patient examined, no change in status, stable for surgery.  I have reviewed the patient's chart and labs.  Questions were answered to the patient's satisfaction.     Hassell Done A Guinn Delarosa

## 2018-05-11 NOTE — Op Note (Signed)
OPERATIVE NOTE:  DESTENIE INGBER PROCEDURE DATE: 05/11/2018   PREOPERATIVE DIAGNOSIS:  1.  Pelvic pain 2.  History of endometrial ablation 3.  History of cesarean section delivery  POSTOPERATIVE DIAGNOSIS: 1.  Pelvic pain 2.  History of endometrial ablation 3.  History of cesarean section delivery 4.  Pelvic adhesions 5.  Suspected endometriosis  PROCEDURE: Laparoscopy with adhesio lysis; excision and fulguration of endometriosis SURGEON:  Brayton Mars, MD ASSISTANTS: Dr. Marcelline Mates ANESTHESIA: General INDICATIONS: 37 y.o. [id number] status post endometrial ablation, history of cesarean section delivery, presents for evaluation of chronic pelvic pain, worsening.  Pain is cyclic including low backache with radiation to left gluteus and left leg.  Prior history of dysmenorrhea.  Prior history of abnormal uterine bleeding.  History of known uterine fibroids.  FINDINGS: Gynecoid pelvis; patient is candidate for [facility] if hysterectomy necessary. Simple left ovarian cyst x2, status post cystotomy.  Corpus luteum cyst on right ovary.  Right fallopian tube with paratubal cyst 0.5 cm; left fallopian tube normal. Uterus appeared normal. Red lesions/petechiae noted in the cul-de-sac; left ovarian fossa notable for pseudo-fenestrations as well as red lesions suspicious for endometriosis.  Left uterosacral ligament with red lesions/petechiae, possibly consistent with endometriosis. Upper abdomen normal. Dense omental/right anterior abdominal wall adhesionst lysed with monopolar cautery and scissors. Bilateral ureters were visualized and notable for normal peristalsis intraoperatively and post completion of the surgery.   I/O's: Total I/O In: 700 [I.V.:700] Out: 210 [Urine:200; Blood:10] COUNTS:  YES SPECIMENS: 1.  Cul-de-sac peritoneum 2.  Left uterosacral ligament 3.  Left ovarian fossa peritoneum  ANTIBIOTIC PROPHYLAXIS:N/A COMPLICATIONS: None immediate  PROCEDURE IN DETAIL: Patient  was brought the operating room and placed in the supine position.  General endotracheal anesthesia was induced that difficulty.  She is placed in dorsolithotomy position using the bumblebee stirrups.  A ChloraPrep and Hibiclens abdominal perineal and intravaginal prep and drape was performed in standard fashion. Timeout was completed. Red Robinson catheter was used to drain 200 mL of urine from the bladder. Subumbilical vertical incision 5 mm in length was made.  The Optiview laparoscopic trocar system was used to place a 5 mm port directly into the abdominal pelvic cavity without evidence of bowel or vascular injury.  Pneumoperitoneum was created with CO2 gas.  The 5 mm 0 degree scope was used to assess the abdomen and pelvis with the above-noted findings being photo documented.  The thick omental/anterior abdominal wall adhesions on the right side of the abdomen were taken down using monopolar cautery and scissors.  Biopsies of the pelvis were then performed in the cul-de-sac, left uterosacral ligament, and left ovarian fossa respectively.  These areas were then cauterized using Kleppinger bipolar forceps.  Minimal bleeding was encountered.  Following completion of the procedure and thorough inspection of the abdomen and pelvis, the procedure was terminated with all instrumentation being removed from the abdominal pelvic cavity.  Pneumoperitoneum was released.  The incisions were closed with Dermabond glue.  Patient was then awakened mobilized and taken recovery in satisfactory condition. The procedure was performed with the assistance of Dr. Marcelline Mates due to the complexity of the procedure and the lack of available first assistant.  Should the patient require definitive surgery, transvaginal hysterectomy will be the recommended route.  Jaret Coppedge A. Zipporah Plants, MD, [facility]

## 2018-05-11 NOTE — Anesthesia Postprocedure Evaluation (Signed)
Anesthesia Post Note  Patient: Diane Roberts  Procedure(s) Performed: LAPAROSCOPY DIAGNOSTIC WITH BIOPSIES (N/A ) LAPAROSCOPIC LYSIS OF ADHESIONS  Patient location during evaluation: PACU Anesthesia Type: General Level of consciousness: awake and alert and oriented Pain management: pain level controlled Vital Signs Assessment: post-procedure vital signs reviewed and stable Respiratory status: spontaneous breathing Cardiovascular status: blood pressure returned to baseline Anesthetic complications: no     Last Vitals:  Vitals:   05/11/18 1424 05/11/18 1433  BP: 109/64 110/65  Pulse: (!) 53 (!) 58  Resp: 16 16  Temp: (!) 36.3 C (!) 36.3 C  SpO2: 99% 100%    Last Pain:  Vitals:   05/11/18 1433  TempSrc: Temporal  PainSc: 5                  Meloni Hinz

## 2018-05-11 NOTE — Anesthesia Procedure Notes (Signed)
Procedure Name: Intubation Performed by: Demetrius Charity, CRNA Pre-anesthesia Checklist: Patient identified, Patient being monitored, Timeout performed, Emergency Drugs available and Suction available Patient Re-evaluated:Patient Re-evaluated prior to induction Oxygen Delivery Method: Circle system utilized Preoxygenation: Pre-oxygenation with 100% oxygen Induction Type: IV induction Ventilation: Mask ventilation without difficulty Laryngoscope Size: Mac and 3 Grade View: Grade I Tube type: Oral Tube size: 7.0 mm Number of attempts: 1 Airway Equipment and Method: Stylet Placement Confirmation: ETT inserted through vocal cords under direct vision,  positive ETCO2 and breath sounds checked- equal and bilateral Secured at: 21 cm Tube secured with: Tape Dental Injury: Teeth and Oropharynx as per pre-operative assessment

## 2018-05-11 NOTE — Discharge Instructions (Addendum)

## 2018-05-11 NOTE — Transfer of Care (Signed)
Immediate Anesthesia Transfer of Care Note  Patient: Diane Roberts  Procedure(s) Performed: LAPAROSCOPY DIAGNOSTIC WITH BIOPSIES (N/A ) LAPAROSCOPIC LYSIS OF ADHESIONS  Patient Location: PACU  Anesthesia Type:General  Level of Consciousness: awake, alert  and oriented  Airway & Oxygen Therapy: Patient Spontanous Breathing and Patient connected to face mask oxygen  Post-op Assessment: Report given to RN and Post -op Vital signs reviewed and stable  Post vital signs: Reviewed and stable  Last Vitals:  Vitals Value Taken Time  BP 112/72 05/11/2018  1:24 PM  Temp    Pulse 80 05/11/2018  1:24 PM  Resp 15 05/11/2018  1:24 PM  SpO2 100 % 05/11/2018  1:24 PM  Vitals shown include unvalidated device data.  Last Pain:  Vitals:   05/11/18 1107  TempSrc: Oral  PainSc: 0-No pain         Complications: No apparent anesthesia complications

## 2018-05-11 NOTE — Anesthesia Preprocedure Evaluation (Signed)
Anesthesia Evaluation  Patient identified by MRN, date of birth, ID band Patient awake    Reviewed: Allergy & Precautions, NPO status , Patient's Chart, lab work & pertinent test results  Airway Mallampati: II  TM Distance: >3 FB     Dental  (+) Teeth Intact   Pulmonary former smoker,    Pulmonary exam normal        Cardiovascular negative cardio ROS Normal cardiovascular exam     Neuro/Psych  Headaches, negative psych ROS   GI/Hepatic negative GI ROS, Neg liver ROS,   Endo/Other  negative endocrine ROS  Renal/GU   Female GU complaint     Musculoskeletal negative musculoskeletal ROS (+)   Abdominal Normal abdominal exam  (+)   Peds negative pediatric ROS (+)  Hematology  (+) anemia ,   Anesthesia Other Findings Past Medical History: No date: Anemia     Comment:  after delivery 2018: Breast mass     Comment:  bil masses felt by MD No date: Frequent headaches No date: History of kidney stones     Comment:  currently as of 04-2018 bil No date: Seizure [facility])     Comment:  age 39 x1  Reproductive/Obstetrics                             Anesthesia Physical Anesthesia Plan  ASA: II  Anesthesia Plan: General   Post-op Pain Management:    Induction: Intravenous  PONV Risk Score and Plan:   Airway Management Planned: Oral ETT  Additional Equipment:   Intra-op Plan:   Post-operative Plan: Extubation in OR  Informed Consent: I have reviewed the patients History and Physical, chart, labs and discussed the procedure including the risks, benefits and alternatives for the proposed anesthesia with the patient or authorized representative who has indicated his/her understanding and acceptance.   Dental advisory given  Plan Discussed with: CRNA and Surgeon  Anesthesia Plan Comments:         Anesthesia Quick Evaluation

## 2018-05-12 ENCOUNTER — Encounter: Payer: Self-pay | Admitting: Obstetrics and Gynecology

## 2018-05-13 LAB — SURGICAL PATHOLOGY

## 2018-05-14 ENCOUNTER — Telehealth: Payer: Self-pay | Admitting: Obstetrics and Gynecology

## 2018-05-14 NOTE — Telephone Encounter (Signed)
Patient called stating she had surgery on Monday. She is experiencing a bloody," mucousy stringy "like discharge and didn't know if this is normal.Thanks

## 2018-05-15 NOTE — Telephone Encounter (Signed)
LMTRC

## 2018-05-15 NOTE — Telephone Encounter (Signed)
Pt is s/p lap with bx on 05/11/2018.  Yesterday she had a mucous stringy d/c. Dime size blood on tp. She also noticed blood on the TP after work yesterday. NO cramps. Pos for being sore. Pos for bloating. No fever. Pos for eating and drinking. NO uti sx. NO bm since last week. Pain meds at night only. Advised pt to take stool softeners tid. Push h20. Try to take ibuprofen 800mg  for pain if needed. If bleeding like a cycle, fever or intense pain to contact office.  F/u as scheduled.

## 2018-05-20 ENCOUNTER — Encounter: Payer: Self-pay | Admitting: Obstetrics and Gynecology

## 2018-05-20 ENCOUNTER — Ambulatory Visit (INDEPENDENT_AMBULATORY_CARE_PROVIDER_SITE_OTHER): Payer: BLUE CROSS/BLUE SHIELD | Admitting: Obstetrics and Gynecology

## 2018-05-20 VITALS — BP 99/65 | HR 92 | Ht 64.0 in | Wt 149.2 lb

## 2018-05-20 DIAGNOSIS — N83202 Unspecified ovarian cyst, left side: Secondary | ICD-10-CM

## 2018-05-20 DIAGNOSIS — Z9889 Other specified postprocedural states: Secondary | ICD-10-CM

## 2018-05-20 DIAGNOSIS — Z09 Encounter for follow-up examination after completed treatment for conditions other than malignant neoplasm: Secondary | ICD-10-CM

## 2018-05-20 DIAGNOSIS — R102 Pelvic and perineal pain: Secondary | ICD-10-CM

## 2018-05-20 NOTE — Progress Notes (Signed)
Chief complaint: 1.  Postop check 2.  Status post laparoscopy with peritoneal biopsies and excision and fulguration of endometriosis  Patient presents for her 1 week postop check. Findings from surgery have been reviewed: FINDINGS: Gynecoid pelvis; patient is candidate for [facility] if hysterectomy necessary. Simple left ovarian cyst x2, status post cystotomy.  Corpus luteum cyst on right ovary.  Right fallopian tube with paratubal cyst 0.5 cm; left fallopian tube normal. Uterus appeared normal. Red lesions/petechiae noted in the cul-de-sac; left ovarian fossa notable for pseudo-fenestrations as well as red lesions suspicious for endometriosis.  Left uterosacral ligament with red lesions/petechiae, possibly consistent with endometriosis. Upper abdomen normal. Dense omental/right anterior abdominal wall adhesionst lysed with monopolar cautery and scissors. Bilateral ureters were visualized and notable for normal peristalsis intraoperatively and post completion of the surgery. PATHOLOGY: DIAGNOSIS:  A. CUL-DE-SAC:  - FIBROADIPOSE TISSUE SHOWING MILD CHRONIC INFLAMMATION.  - NO EVIDENCE OF ENDOMETRIOSIS.   B. UTEROSACRAL LIGAMENT, LEFT:  - FIBROADIPOSE TISSUE PARTIALLY LINED BY MESOTHELIUM, NO PATHOLOGIC  CHANGES.  - NO EVIDENCE OF ENDOMETRIOSIS.   C. OVARIAN FOSSA, LEFT:  - FIBROUS CONNECTIVE TISSUE AND SMOOTH MUSCLE, PARTIALLY LINED BY  MESOTHELIUM, SHOWING FOCAL CHRONIC INFLAMMATION.  - NO EVIDENCE OF ENDOMETRIOSIS   Patient has done well since surgery.  She reports normal bowel bladder function.  Pelvic pain previously noted is not present at this time.  She returned to work within 24 hours after surgery.  OBJECTIVE: BP 99/65   Pulse 92   Ht 5\' 4"  (1.626 m)   Wt 149 lb 3.2 oz (67.7 kg)   LMP  (LMP Unknown)   BMI 25.61 kg/m  Pleasant female no acute distress.  Alert and oriented. Abdomen: Soft, nontender; laparoscopy port sites are well-healed with associated minimal  ecchymoses in 3 sites; no hernia Extremities: Warm and dry  ASSESSMENT: 1.  10-day postop check status post laparoscopy with excision and fulguration of endometriosis 2.  Pathology did not confirm endometriosis 3.  Findings, visual, are consistent with endometriosis.  These were discussed with patient along with follow-up management options. 4.  Normal postop check 5.  Patient is a candidate for [facility] if hysterectomy is desired  PLAN: 1.  Resume all activities without restriction 2.  Monitor for pelvic pain symptoms 3.  No medication is offered at this time for pain management Delphia Grates  is discussed) 4.  Follow-up when pelvic pain symptoms recur.  Brayton Mars, MD  Note: This dictation was prepared with Dragon dictation along with smaller phrase technology. Any transcriptional errors that result from this process are unintentional.

## 2018-05-20 NOTE — Patient Instructions (Signed)
1.  Biopsies from surgery did not reveal endometriosis. 2.  Monitor symptoms at this time and return as needed if pelvic pain recurs.

## 2018-06-16 DIAGNOSIS — N3281 Overactive bladder: Secondary | ICD-10-CM | POA: Diagnosis not present

## 2018-06-16 DIAGNOSIS — R350 Frequency of micturition: Secondary | ICD-10-CM | POA: Diagnosis not present

## 2018-06-16 DIAGNOSIS — R300 Dysuria: Secondary | ICD-10-CM | POA: Diagnosis not present

## 2018-08-04 ENCOUNTER — Other Ambulatory Visit: Payer: Self-pay | Admitting: Family Medicine

## 2019-02-05 DIAGNOSIS — Z13220 Encounter for screening for lipoid disorders: Secondary | ICD-10-CM | POA: Diagnosis not present

## 2019-02-05 DIAGNOSIS — Z131 Encounter for screening for diabetes mellitus: Secondary | ICD-10-CM | POA: Diagnosis not present

## 2019-02-05 DIAGNOSIS — Z124 Encounter for screening for malignant neoplasm of cervix: Secondary | ICD-10-CM | POA: Diagnosis not present

## 2019-02-05 DIAGNOSIS — Z113 Encounter for screening for infections with a predominantly sexual mode of transmission: Secondary | ICD-10-CM | POA: Diagnosis not present

## 2019-02-05 DIAGNOSIS — R87612 Low grade squamous intraepithelial lesion on cytologic smear of cervix (LGSIL): Secondary | ICD-10-CM | POA: Diagnosis not present

## 2019-02-05 DIAGNOSIS — N898 Other specified noninflammatory disorders of vagina: Secondary | ICD-10-CM | POA: Diagnosis not present

## 2019-02-05 DIAGNOSIS — Z0000 Encounter for general adult medical examination without abnormal findings: Secondary | ICD-10-CM | POA: Diagnosis not present

## 2019-02-05 DIAGNOSIS — N3281 Overactive bladder: Secondary | ICD-10-CM | POA: Diagnosis not present

## 2019-02-05 LAB — TSH: TSH: 0.65 (ref <=5.90)

## 2019-02-05 LAB — BASIC METABOLIC PANEL
BUN: 10 (ref 4–21)
CO2: 29 — AB (ref 13–22)
Chloride: 105 (ref 99–108)
Creatinine: 0.8 (ref <=1.1)
Glucose: 65
Potassium: 3.8 (ref 3.4–5.3)
Sodium: 140 (ref 137–147)

## 2019-02-05 LAB — CBC AND DIFFERENTIAL
HCT: 41 (ref 36–46)
Hemoglobin: 14.4 (ref 12.0–16.0)
Platelets: 170 (ref 150–399)
WBC: 5.1

## 2019-02-05 LAB — HEPATIC FUNCTION PANEL
ALT: 11 (ref 7–35)
AST: 13 (ref 13–35)
Alkaline Phosphatase: 46 (ref 25–125)
Bilirubin, Total: 1

## 2019-02-05 LAB — COMPREHENSIVE METABOLIC PANEL
Albumin: 4.2 (ref 3.5–5.0)
Calcium: 9.2 (ref 8.7–10.7)
GFR calc non Af Amer: 81

## 2019-02-05 LAB — LIPID PANEL
Cholesterol: 160 (ref 0–200)
HDL: 59 (ref 35–70)
LDl/HDL Ratio: 2.7
Triglycerides: 58 (ref 40–160)

## 2019-02-05 LAB — HEMOGLOBIN A1C: Hemoglobin A1C: 5

## 2019-02-05 LAB — CBC: RBC: 4.53 (ref 3.87–5.11)

## 2019-02-22 ENCOUNTER — Telehealth: Payer: Self-pay | Admitting: Obstetrics & Gynecology

## 2019-02-22 NOTE — Telephone Encounter (Signed)
Patient is schedule 03/09/19 with AMS

## 2019-02-22 NOTE — Telephone Encounter (Signed)
Patient has transfer Care to [place]. I received records on 02/18/19. Unable to schedule appointment patient is requesting due not having up to date pap smear results. I called and left voicemail for [facility] Friday, 02/22/19 to have them fact up to date pap smear. I faxed release of info form today requesting results. Called and spoke with patient with update on still waiting on results.

## 2019-02-22 NOTE — Telephone Encounter (Signed)
Called and left voicemail for patient to call back to be schedule. I have received all records for patient to schedule COLPO

## 2019-03-04 ENCOUNTER — Other Ambulatory Visit: Payer: Self-pay | Admitting: Student

## 2019-03-04 DIAGNOSIS — R6889 Other general symptoms and signs: Secondary | ICD-10-CM | POA: Diagnosis not present

## 2019-03-04 DIAGNOSIS — Z20822 Contact with and (suspected) exposure to covid-19: Secondary | ICD-10-CM

## 2019-03-08 LAB — NOVEL CORONAVIRUS, NAA: SARS-CoV-2, NAA: NOT DETECTED

## 2019-03-09 ENCOUNTER — Other Ambulatory Visit (HOSPITAL_COMMUNITY)
Admission: RE | Admit: 2019-03-09 | Discharge: 2019-03-09 | Disposition: A | Discharge status: Home / Self Care | Payer: BC Managed Care – PPO | Source: Ambulatory Visit | Attending: Obstetrics and Gynecology | Admitting: Obstetrics and Gynecology

## 2019-03-09 ENCOUNTER — Other Ambulatory Visit: Payer: Self-pay

## 2019-03-09 ENCOUNTER — Ambulatory Visit: Payer: BC Managed Care – PPO | Admitting: Obstetrics and Gynecology

## 2019-03-09 ENCOUNTER — Encounter: Payer: Self-pay | Admitting: Obstetrics and Gynecology

## 2019-03-09 VITALS — BP 122/74 | Ht 65.0 in | Wt 149.0 lb

## 2019-03-09 DIAGNOSIS — N870 Mild cervical dysplasia: Secondary | ICD-10-CM | POA: Diagnosis not present

## 2019-03-09 DIAGNOSIS — R87612 Low grade squamous intraepithelial lesion on cytologic smear of cervix (LGSIL): Secondary | ICD-10-CM | POA: Diagnosis not present

## 2019-03-09 DIAGNOSIS — N72 Inflammatory disease of cervix uteri: Secondary | ICD-10-CM

## 2019-03-09 DIAGNOSIS — B977 Papillomavirus as the cause of diseases classified elsewhere: Secondary | ICD-10-CM | POA: Insufficient documentation

## 2019-03-09 NOTE — Progress Notes (Signed)
Obstetrics & Gynecology Office Visit   Chief Complaint:  Chief Complaint  Patient presents with  . Colposcopy    History of Present Illness:Diane Roberts is a 38 y.o. woman who presents today for continued surveillance for history of dysplasia. Last pap obtained on 02/15/2019 revealed LGSIL HPV positive.    The patient is status post prior endometrial ablation.  She has resumed some menstruation but it is relatively light, no significant dysmenorrhea.    Pap/Treatment History: 04/21/2018 NIL  Review of Systems: Review of Systems  All other systems reviewed and are negative.    Past Medical History:  Past Medical History:  Diagnosis Date  . Abnormal Pap smear of cervix   . Anemia    after delivery  . Breast mass 2018   bil masses felt by MD  . Frequent headaches   . History of kidney stones    currently as of 04-2018 bil  . Seizure [facility])    age 22 x1    Past Surgical History:  Past Surgical History:  Procedure Laterality Date  . ablation     ut  . APPENDECTOMY  2012  . CHOLECYSTECTOMY  2002  . LAPAROSCOPIC LYSIS OF ADHESIONS  05/11/2018   Procedure: LAPAROSCOPIC LYSIS OF ADHESIONS;  Surgeon: Diane Mars, MD;  Location: [facility] ORS;  Service: Gynecology;;  . LAPAROSCOPY N/A 05/11/2018   Procedure: LAPAROSCOPY DIAGNOSTIC WITH BIOPSIES;  Surgeon: Diane Mars, MD;  Location: [facility] ORS;  Service: Gynecology;  Laterality: N/A;  . OVARIAN CYST REMOVAL    . TONSILLECTOMY  2010    Gynecologic History: No LMP recorded. Patient has had an ablation.  Obstetric History: [id number]  Family History:  Family History  Problem Relation Age of Onset  . Hypertension Mother   . Kidney disease Mother   . Hyperlipidemia Mother   . Breast cancer Neg Hx   . Ovarian cancer Neg Hx   . Colon cancer Neg Hx     Social History:  Social History   Socioeconomic History  . Marital status: Single    Spouse name: Not on file  . Number of children: Not on file  .  Years of education: Not on file  . Highest education level: Not on file  Occupational History  . Not on file  Social Needs  . Financial resource strain: Not on file  . Food insecurity    Worry: Not on file    Inability: Not on file  . Transportation needs    Medical: Not on file    Non-medical: Not on file  Tobacco Use  . Smoking status: Former Smoker    Packs/day: 0.50    Years: 18.00    Pack years: 9.00    Types: Cigarettes    Quit date: 04/08/2017    Years since quitting: 1.9  . Smokeless tobacco: Never Used  Substance and Sexual Activity  . Alcohol use: Yes    Comment: occasional  . Drug use: No  . Sexual activity: Yes    Comment: ablation  Lifestyle  . Physical activity    Days per week: Not on file    Minutes per session: Not on file  . Stress: Not on file  Relationships  . Social Herbalist on phone: Not on file    Gets together: Not on file    Attends religious service: Not on file    Active member of club or organization: Not on file  Attends meetings of clubs or organizations: Not on file    Relationship status: Not on file  . Intimate partner violence    Fear of current or ex partner: Not on file    Emotionally abused: Not on file    Physically abused: Not on file    Forced sexual activity: Not on file  Other Topics Concern  . Not on file  Social History Narrative  . Not on file    Allergies:  No Known Allergies  Medications: Prior to Admission medications   Medication Sig Start Date End Date Taking? Authorizing Provider  phentermine (ADIPEX-P) 37.5 MG tablet Take 37.5 mg by mouth daily before breakfast.    [provider]    Physical Exam Vitals:  Vitals:   03/09/19 1335  BP: 122/74   No LMP recorded. Patient has had an ablation.  General: NAD, well nourished appears stated age 62: normocephalic, anicteric Pulmonary: No increased work of breathing Genitourinary:  External: Normal external female genitalia.   Normal urethral meatus, normal  Bartholin's and Skene's glands.    Vagina: Normal vaginal mucosa, no evidence of prolapse.    Cervix: Grossly normal in appearance, no bleeding  Uterus: Non-enlarged, mobile, normal contour.  No CMT  Adnexa: ovaries non-enlarged, no adnexal masses  Rectal: deferred  Lymphatic: no evidence of inguinal lymphadenopathy Extremities: no edema, erythema, or tenderness Neurologic: Grossly intact Psychiatric: mood appropriate, affect full  Female chaperone present for pelvic and breast  portions of the physical exam  GYNECOLOGY CLINIC COLPOSCOPY PROCEDURE NOTE  38 y.o. [id number] here for colposcopy for LGSIL HPV positive  pap smear on 02/15/2019. Discussed underlying role for HPV infection in the development of cervical dysplasia, its natural history and progression/regression, need for surveillance.  Is the patient  pregnant: No LMP: No LMP recorded. Patient has had an ablation. Smoking status:  reports that she quit smoking about 23 months ago. Her smoking use included cigarettes. She has a 9.00 pack-year smoking history. She has never used smokeless tobacco. (vapes)  Patient given informed consent, signed copy in the chart, time out was performed.  The patient was position in dorsal lithotomy position. Speculum was placed the cervix was visualized.   After application of acetic acid colposcopic inspection of the cervix was undertaken.   Colposcopy adequate, full visualization of transformation zone: Yes acetowhite changes, small nabothian cyst at 4 O'Clock; corresponding biopsies obtained 12 and 5 O'Clock   ECC specimen obtained:  Yes  All specimens were labeled and sent to pathology.   Patient was given post procedure instructions.  Will follow up pathology and manage accordingly.  Routine preventative health maintenance measures emphasized.  OBGyn Exam     Assessment: 38 y.o. [id number] follow up for ASCUS HPV positive pap  Plan: Problem List Items  Addressed This Visit    None    Visit Diagnoses    LGSIL on Pap smear of cervix    -  Primary   Relevant Orders   Surgical pathology   High risk human papilloma virus (HPV) infection of cervix       Relevant Orders   Surgical pathology      - Colposcopy today  - I had a lengthly discussion with Diane Roberts  regarding the cause of dysplasia of the lower genital tract (including immunosuppression in the setting of HPV exposure and tobacco exposure). I explained the potential for progression to invasive malignancy, the recurrent nature of these lesions (and the need for close continued followup). Results  of today's pap will dictate need for further evaluation and follow up per ASCCP guidelines.  - We discussed that 80% of the population will have exposure to human papilloma virus (HPV) during their lifetime.  HPV is a large group of viruses, and are also the causative virus for common warts and genital warts.  The pap smear tests for 13 high risk HPV strains that have some association with cervical cancer, but do not cause visual lesion such as warts.  HPV type 16 and 18 have the highest association with cervical cancer.  The vast majority of HPV infections will be uncomplicated at clear spontaneously in 12-18 months in non-immunocompromised patients.  Patient with compromised immune systems, those taking immunosuppressive drugs, or smoker have shown to have a lower clearance rate and higher persistence of HPV infection.    Currently there are no FDA approved treatments to promote HPV clearance.  Gardasil vaccination is available to prevent HPV infection, but this is only beneficial pre-exposure.  Abstaining from intercourse will not increase clearance  Lastly we stressed that if properly followed HPV should not lead to cervical cancer.   The goal of screening is to identify patient who develop precancerous lesions of the cervix and treat these prior to progression to frank cervical cancer.   The incidence of cervical cancer is 7 cases per 100,000 women in the [place] a year.  This relatively low rate is in part due to universal screening as well as vaccination efforts.    - She is comfortable with the plan and had her questions answered.  - Failure rates of endometrial ablations discussed as well as options for recourse should this happen.  If bleeding gets heavy or increased dysmenorrhea attempts at hormonal dilation, dilation to alleviate any outlet obstruction, or hysterectomy are available   Return in about 1 year (around 03/08/2020) for annual.   Malachy Mood, MD, [place], [facility] 03/09/2019, 1:40 PM

## 2019-05-06 DIAGNOSIS — Z23 Encounter for immunization: Secondary | ICD-10-CM | POA: Diagnosis not present

## 2019-05-06 DIAGNOSIS — K602 Anal fissure, unspecified: Secondary | ICD-10-CM | POA: Diagnosis not present

## 2019-05-24 DIAGNOSIS — F411 Generalized anxiety disorder: Secondary | ICD-10-CM | POA: Diagnosis not present

## 2019-05-24 DIAGNOSIS — F410 Panic disorder [episodic paroxysmal anxiety] without agoraphobia: Secondary | ICD-10-CM | POA: Diagnosis not present

## 2019-05-24 DIAGNOSIS — R0789 Other chest pain: Secondary | ICD-10-CM | POA: Diagnosis not present

## 2019-08-13 DIAGNOSIS — T8859XA Other complications of anesthesia, initial encounter: Secondary | ICD-10-CM

## 2019-08-13 HISTORY — DX: Other complications of anesthesia, initial encounter: T88.59XA

## 2019-08-30 ENCOUNTER — Other Ambulatory Visit: Payer: Self-pay

## 2019-08-30 ENCOUNTER — Ambulatory Visit (INDEPENDENT_AMBULATORY_CARE_PROVIDER_SITE_OTHER): Payer: BC Managed Care – PPO | Admitting: Family Medicine

## 2019-08-30 ENCOUNTER — Other Ambulatory Visit (HOSPITAL_COMMUNITY)
Admission: RE | Admit: 2019-08-30 | Discharge: 2019-08-30 | Disposition: A | Discharge status: Home / Self Care | Payer: BC Managed Care – PPO | Source: Ambulatory Visit | Attending: Family Medicine | Admitting: Family Medicine

## 2019-08-30 ENCOUNTER — Encounter: Payer: Self-pay | Admitting: Family Medicine

## 2019-08-30 VITALS — BP 111/72 | HR 64 | Temp 96.6°F | Ht 64.0 in | Wt 156.0 lb

## 2019-08-30 DIAGNOSIS — Z8719 Personal history of other diseases of the digestive system: Secondary | ICD-10-CM | POA: Diagnosis not present

## 2019-08-30 DIAGNOSIS — M7122 Synovial cyst of popliteal space [Baker], left knee: Secondary | ICD-10-CM | POA: Insufficient documentation

## 2019-08-30 DIAGNOSIS — N898 Other specified noninflammatory disorders of vagina: Secondary | ICD-10-CM

## 2019-08-30 DIAGNOSIS — M25562 Pain in left knee: Secondary | ICD-10-CM

## 2019-08-30 DIAGNOSIS — G8929 Other chronic pain: Secondary | ICD-10-CM | POA: Insufficient documentation

## 2019-08-30 NOTE — Assessment & Plan Note (Signed)
Longstanding issue As above, exam seems consistent for Baker's cyst of the left knee Conservative management She is also having some feeling that her patella is dislocating or not tracking well She may have some patellofemoral syndrome We discussed the importance of physical therapy for this She is being referred to orthopedics for further evaluation Deferring imaging, as it will be done at their office anyways Discussed ice, rest, elevation, NSAIDs Return precautions discussed

## 2019-08-30 NOTE — Assessment & Plan Note (Signed)
Longstanding issue for nearly 1.5 years Discharge noted on exam today is thin and white in nature We discussed that this very well may be physiologic discharge We will send for vaginitis testing, for gonorrhea, chlamydia, trichomonas, BV, yeast If all negative, discussed that this is her physiologic discharge We discussed the physiologic discharge can change over time, even though she did not previously have this We discussed that it is benign in nature and there is nothing to do about it We did discuss that in menopause, this will stop Can use a panty liner as needed Pending results of vaginitis swab, would treat for any positives

## 2019-08-30 NOTE — Assessment & Plan Note (Signed)
Patient's history and exam is concerning for possible Baker's cyst of left knee As this is chronic and she has no signs of DVT, we will hold off on imaging as she is going to be seeing orthopedics for knee pain as well anyways Advised to rest, ice, elevation, NSAIDs Return precautions discussed

## 2019-08-30 NOTE — Progress Notes (Signed)
Patient: Diane Roberts, Female    DOB: 1980-12-28, 39 y.o.   MRN: [id number] Visit Date: 08/30/2019  Today's Provider: Lavon Paganini, MD   Chief Complaint  Patient presents with  . New Patient (Initial Visit)   Subjective:     Annual physical exam Diane Roberts is a 39 y.o. female who presents today for health maintenance and complete physical. She feels well. She reports exercising as walking a lot at work. She reports she is sleeping fairly well.  Ws previously seen at [facility] for PCP -----------------------------------------------------------------   Pap done 02/15/2019 positive for HPV, colposcopy on 03/09/2019 with benign pathology.  Recommended repeat Pap smear in 1 year.  Vaginal discharge: Was seen at OB/gyn [facility]) for it Was told that it would go away in menopause White mucousy discharge every time she goes to bathroom. Has been milky white intermittently No itching No distinct smell She can feel it coming out Started in 05/2018, after laparoscopy to look for possible endometriosis in 04/2018   Anal fissure. Can have diarrhea up to 10 times daily since cholecystectomy multiple years ago.  Feels like fissures are occurring regularly Bleeding intermittently Used topical treatments previous No constipation No fiber supplements in her diet Has never seen GI or general surgeon about these Does not feel like she has one currently   Has been having left knee pain with a fullness in the posterior knee and a feeling of the patella catching/dislocating from time to time with certain movements for several years.  She spends all day on her feet at work on hard concrete with steel toed boots.  Review of Systems  Constitutional: Negative.   HENT: Negative.   Eyes: Negative.   Respiratory: Negative.   Cardiovascular: Negative.   Gastrointestinal: Positive for anal bleeding, diarrhea and rectal pain. Negative for abdominal distention, abdominal pain, blood in  stool, constipation, nausea and vomiting.  Endocrine: Negative.   Genitourinary: Positive for frequency and vaginal discharge. Negative for decreased urine volume, difficulty urinating, dyspareunia, dysuria, enuresis, flank pain, genital sores, hematuria, menstrual problem, pelvic pain, urgency, vaginal bleeding and vaginal pain.  Musculoskeletal: Positive for arthralgias. Negative for back pain, gait problem, joint swelling, myalgias, neck pain and neck stiffness.  Skin: Negative.   Allergic/Immunologic: Negative.   Hematological: Negative.   Psychiatric/Behavioral: Negative.     Social History      She  reports that she quit smoking about 2 years ago. Her smoking use included cigarettes. She has a 9.00 pack-year smoking history. She has never used smokeless tobacco. She reports current alcohol use of about 1.0 standard drinks of alcohol per week. She reports that she does not use drugs.       Social History   Socioeconomic History  . Marital status: Single    Spouse name: Not on file  . Number of children: 2  . Years of education: Not on file  . Highest education level: Not on file  Occupational History  . Occupation: quality lead  Tobacco Use  . Smoking status: Former Smoker    Packs/day: 0.50    Years: 18.00    Pack years: 9.00    Types: Cigarettes    Quit date: 04/08/2017    Years since quitting: 2.3  . Smokeless tobacco: Never Used  Substance and Sexual Activity  . Alcohol use: Yes    Alcohol/week: 1.0 standard drinks    Types: 1 Cans of beer per week    Comment: occasional  . Drug use:  No  . Sexual activity: Yes    Partners: Male    Comment: ablation  Other Topics Concern  . Not on file  Social History Narrative  . Not on file   Social Determinants of Health   Financial Resource Strain:   . Difficulty of Paying Living Expenses: Not on file  Food Insecurity:   . Worried About Charity fundraiser in the Last Year: Not on file  . Ran Out of Food in the Last  Year: Not on file  Transportation Needs:   . Lack of Transportation (Medical): Not on file  . Lack of Transportation (Non-Medical): Not on file  Physical Activity:   . Days of Exercise per Week: Not on file  . Minutes of Exercise per Session: Not on file  Stress:   . Feeling of Stress : Not on file  Social Connections:   . Frequency of Communication with Friends and Family: Not on file  . Frequency of Social Gatherings with Friends and Family: Not on file  . Attends Religious Services: Not on file  . Active Member of Clubs or Organizations: Not on file  . Attends Archivist Meetings: Not on file  . Marital Status: Not on file    Past Medical History:  Diagnosis Date  . Abnormal Pap smear of cervix   . Anemia    after delivery  . Breast mass 2018   bil masses felt by MD  . Frequent headaches   . History of kidney stones    currently as of 04-2018 bil  . Seizure [facility])    age 23 x1     Patient Active Problem List   Diagnosis Date Noted  . History of anal fissures 08/30/2019  . Chronic pain of left knee 08/30/2019  . Synovial cyst of left popliteal space 08/30/2019  . Pelvic pain 04/21/2018  . Uterine leiomyoma 04/21/2018  . Leukorrhea 04/21/2018  . Frequent headaches 09/09/2013    Past Surgical History:  Procedure Laterality Date  . ABLATION    . APPENDECTOMY  2012  . CHOLECYSTECTOMY  2002  . LAPAROSCOPIC LYSIS OF ADHESIONS  05/11/2018   Procedure: LAPAROSCOPIC LYSIS OF ADHESIONS;  Surgeon: Brayton Mars, MD;  Location: [facility] ORS;  Service: Gynecology;;  . LAPAROSCOPY N/A 05/11/2018   Procedure: LAPAROSCOPY DIAGNOSTIC WITH BIOPSIES;  Surgeon: Brayton Mars, MD;  Location: [facility] ORS;  Service: Gynecology;  Laterality: N/A;  . OVARIAN CYST REMOVAL    . TONSILLECTOMY  2010    Family History        Family Status  Relation Name Status  . Mother  Alive  . Father  Alive  . MGM  Deceased  . MGF  Deceased  . PGF  Deceased  . Neg Hx  (Not  Specified)        Her family history includes COPD in her maternal grandfather, mother, and paternal grandfather; Clotting disorder in her maternal grandmother; Gout in her maternal grandfather; Heart failure in her maternal grandfather and paternal grandfather; Hyperlipidemia in her mother; Hypertension in her father, mother, and paternal grandfather; Kidney disease in her mother; Osteoporosis in her mother; Thyroid disease in her mother. There is no history of Breast cancer, Ovarian cancer, or Colon cancer.      No Known Allergies   Current Outpatient Medications:  .  propranolol (INDERAL) 20 MG tablet, Take 20 mg by mouth 2 (two) times daily as needed., Disp: , Rfl:    Patient Care Team: Virginia Crews,  MD as PCP - General (Family Medicine)    Objective:    Vitals: BP 111/72 (BP Location: Left Arm, Patient Position: Sitting, Cuff Size: Normal)   Pulse 64   Temp (!) 96.6 F (35.9 C) (Temporal)   Ht 5\' 4"  (1.626 m)   Wt 156 lb (70.8 kg)   BMI 26.78 kg/m    Vitals:   08/30/19 0840  BP: 111/72  Pulse: 64  Temp: (!) 96.6 F (35.9 C)  TempSrc: Temporal  Weight: 156 lb (70.8 kg)  Height: 5\' 4"  (1.626 m)     Physical Exam Vitals reviewed.  Constitutional:      General: She is not in acute distress.    Appearance: Normal appearance. She is well-developed. She is not diaphoretic.  HENT:     Head: Normocephalic and atraumatic.     Right Ear: Tympanic membrane, ear canal and external ear normal.     Left Ear: Tympanic membrane, ear canal and external ear normal.  Eyes:     General: No scleral icterus.    Conjunctiva/sclera: Conjunctivae normal.     Pupils: Pupils are equal, round, and reactive to light.  Neck:     Thyroid: No thyromegaly.  Cardiovascular:     Rate and Rhythm: Normal rate and regular rhythm.     Heart sounds: Normal heart sounds. No murmur.  Pulmonary:     Effort: Pulmonary effort is normal. No respiratory distress.     Breath sounds: Normal  breath sounds. No wheezing or rales.  Abdominal:     General: Bowel sounds are normal. There is no distension.     Palpations: Abdomen is soft.     Tenderness: There is no abdominal tenderness. There is no guarding or rebound.  Genitourinary:    Comments: GYN:  External genitalia within normal limits.  Vaginal mucosa pink, moist, normal rugae.  Nonfriable cervix without lesions, no bleeding, minimal thin discharge noted on speculum exam.   Rectum: Normal, no fissures or external hemorrhoids Musculoskeletal:        General: No deformity.     Cervical back: Neck supple.     Right lower leg: No edema.     Left lower leg: No edema.     Comments: Left knee: Range of motion is intact.  Strength intact of lower extremity.  Patella seems to track appropriately, but there is some crepitus with palpation of the left patella and some pain with patellar compression.  Ligaments with stable endpoints.  Gait intact.  Negative provocative meniscal testing.  Does have fullness of left popliteal space concerning for possible Baker's cyst that is not tender to palpation.  Lymphadenopathy:     Cervical: No cervical adenopathy.  Skin:    General: Skin is warm and dry.     Capillary Refill: Capillary refill takes less than 2 seconds.     Findings: No rash.  Neurological:     Mental Status: She is alert and oriented to person, place, and time. Mental status is at baseline.  Psychiatric:        Mood and Affect: Mood normal.        Behavior: Behavior normal.        Thought Content: Thought content normal.      Depression Screen PHQ 2/9 Scores 08/30/2019 09/09/2013  PHQ - 2 Score 0 0  PHQ- 9 Score 2 -       Assessment & Plan:     Establish care  Exercise Activities and Dietary recommendations Goals  None     Immunization History  Administered Date(s) Administered  . Influenza,inj,Quad PF,6+ Mos 05/26/2019  . Influenza-Unspecified 06/12/2013    Health Maintenance  Topic Date Due  . HIV  Screening  02/02/1997  . PAP SMEAR-Modifier  04/21/2021  . TETANUS/TDAP  05/29/2027  . INFLUENZA VACCINE  Completed     Discussed health benefits of physical activity, and encouraged her to engage in regular exercise appropriate for her age and condition.    Reviewed records from previous PCP and GYN and her health records.  We will abstract her last labs, which were also reviewed with the patient. --------------------------------------------------------------------  Problem List Items Addressed This Visit      Musculoskeletal and Integument   Synovial cyst of left popliteal space    Patient's history and exam is concerning for possible Baker's cyst of left knee As this is chronic and she has no signs of DVT, we will hold off on imaging as she is going to be seeing orthopedics for knee pain as well anyways Advised to rest, ice, elevation, NSAIDs Return precautions discussed        Other   Leukorrhea - Primary    Longstanding issue for nearly 1.5 years Discharge noted on exam today is thin and white in nature We discussed that this very well may be physiologic discharge We will send for vaginitis testing, for gonorrhea, chlamydia, trichomonas, BV, yeast If all negative, discussed that this is her physiologic discharge We discussed the physiologic discharge can change over time, even though she did not previously have this We discussed that it is benign in nature and there is nothing to do about it We did discuss that in menopause, this will stop Can use a panty liner as needed Pending results of vaginitis swab, would treat for any positives      Relevant Orders   Cervicovaginal ancillary only   History of anal fissures    No anal fissure today 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 This is likely related to her loose stools in the setting of cholecystectomy Discussed importance of adding fiber supplement to her diet to bulk up her  stools Discussed avoiding constipation If continues to occur, consider referral to general surgery to consider procedural management      Chronic pain of left knee    Longstanding issue As above, exam seems consistent for Baker's cyst of the left knee Conservative management She is also having some feeling that her patella is dislocating or not tracking well She may have some patellofemoral syndrome We discussed the importance of physical therapy for this She is being referred to orthopedics for further evaluation Deferring imaging, as it will be done at their office anyways Discussed ice, rest, elevation, NSAIDs Return precautions discussed         Total time spent on today's visit was greater than 45 minutes, including both face-to-face time and nonface-to-face time personally spent on review of chart (labs and imaging), discussing labs and goals, discussing further work-up, treatment options, referrals to specialist if needed, reviewing outside records of pertinent, answering patient's questions, and coordinating care.   Return in about 6 months (around 02/27/2020) for CPE.   The entirety of the information documented in the History of Present Illness, Review of Systems and Physical Exam were personally obtained by me. Portions of this information were initially documented by Elonda Husky , CMA and reviewed by me for thoroughness and accuracy.    Virginia Crews,  MD MPH [facility]

## 2019-08-30 NOTE — Patient Instructions (Signed)
Anal Fissure, Adult  An anal fissure is a small tear or crack in the tissue around the opening of the butt (anus). Bleeding from the tear or crack usually stops on its own within a few minutes. The bleeding may happen every time you poop (have a bowel movement) until the tear or crack heals. What are the causes? This condition is usually caused by passing a large or hard poop (stool). Other causes include:  Trouble pooping (constipation).  Passing watery poop (diarrhea).  Inflammatory bowel disease (Crohn's disease or ulcerative colitis).  Childbirth.  Infections.  Anal sex. What are the signs or symptoms? Symptoms of this condition include:  Bleeding from the butt.  Small amounts of blood on your poop. The blood coats the outside of the poop. It is not mixed with the poop.  Small amounts of blood on the toilet paper or in the toilet after you poop.  Pain when passing poop.  Itching or irritation around the opening of the butt. How is this diagnosed? This condition may be diagnosed based on a physical exam. Your doctor may:  Check your butt. A tear can often be seen by checking the area with care.  Check your butt using a short tube (anoscope). The light in the tube will show any problems in your butt. How is this treated? Treatment for this condition may include:  Treating problems that make it hard for you to pass poop. You may be told to: ? Eat more fiber. ? Drink more fluid. ? Take fiber supplements. ? Take medicines that make poop soft.  Taking sitz baths. This may help to heal the tear.  Using creams and ointments. If your condition gets worse, other treatments may be needed such as:  A shot near the tear or crack (botulinum injection).  Surgery to repair the tear or crack. Follow these instructions at home: Eating and drinking   Avoid bananas and dairy products. These foods can make it hard to poop.  Drink enough fluid to keep your pee (urine) pale  yellow.  Eat foods that have a lot of fiber in them, such as: ? Beans. ? Whole grains. ? Fresh fruits. ? Fresh vegetables. General instructions   Take over-the-counter and prescription medicines only as told by your doctor.  Use creams or ointments only as told by your doctor.  Keep the butt area as clean and dry as you can.  Take a warm water bath (sitz bath) as told by your doctor. Do not use soap.  Keep all follow-up visits as told by your doctor. This is important. Contact a doctor if:  You have more bleeding.  You have a fever.  You have watery poop that is mixed with blood.  You have pain.  Your problem gets worse, not better. Summary  An anal fissure is a small tear or crack in the skin around the opening of the butt (anus).  This condition is usually caused by passing a large or hard poop (stool).  Treatment includes treating the problems that make it hard for you to pass poop.  Follow your doctor's instructions about caring for your condition at home.  Keep all follow-up visits as told by your doctor. This is important. This information is not intended to replace advice given to you by your health care provider. Make sure you discuss any questions you have with your health care provider. Document Revised: 01/08/2018 Document Reviewed: 01/08/2018 Elsevier Patient Education  2020 Elsevier Inc.  

## 2019-08-30 NOTE — Assessment & Plan Note (Signed)
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

## 2019-08-31 LAB — CERVICOVAGINAL ANCILLARY ONLY
Bacterial Vaginitis (gardnerella): POSITIVE — AB
Candida Glabrata: NEGATIVE
Candida Vaginitis: NEGATIVE
Chlamydia: NEGATIVE
Comment: NEGATIVE
Comment: NEGATIVE
Comment: NEGATIVE
Comment: NEGATIVE
Comment: NEGATIVE
Comment: NORMAL
Neisseria Gonorrhea: NEGATIVE
Trichomonas: NEGATIVE

## 2019-09-01 ENCOUNTER — Telehealth: Payer: Self-pay

## 2019-09-01 MED ORDER — METRONIDAZOLE 500 MG PO TABS: 500.0000 mg | ORAL_TABLET | Freq: Two times a day (BID) | ORAL | 0 refills | Status: DC

## 2019-09-01 NOTE — Telephone Encounter (Signed)
Patient returned call and can be reached at Ph# [phone]

## 2019-09-01 NOTE — Telephone Encounter (Signed)
Patient advised as below. Patient verbalizes understanding and is in agreement with treatment plan.  

## 2019-09-01 NOTE — Telephone Encounter (Signed)
-----   Message from Virginia Crews, MD sent at 09/01/2019  8:22 AM EST ----- Positive for bacterial vaginitis.  This is not a sexually transmitted infection, but it can cause discharge.  Recommend treatment with 7-day course of Flagyl.  If patient agrees to treatment, we can E prescribe Flagyl 500 mg twice daily x7 days, #14, R0.

## 2019-09-01 NOTE — Addendum Note (Signed)
Addended by: Shawna Orleans on: 09/01/2019 10:02 AM   Modules accepted: Orders

## 2019-09-01 NOTE — Telephone Encounter (Signed)
LMTCB

## 2019-10-07 ENCOUNTER — Encounter: Payer: Self-pay | Admitting: Family Medicine

## 2019-10-08 ENCOUNTER — Ambulatory Visit (INDEPENDENT_AMBULATORY_CARE_PROVIDER_SITE_OTHER): Payer: BC Managed Care – PPO | Admitting: Physician Assistant

## 2019-10-08 ENCOUNTER — Telehealth: Payer: Self-pay

## 2019-10-08 ENCOUNTER — Encounter: Payer: Self-pay | Admitting: Physician Assistant

## 2019-10-08 DIAGNOSIS — B9689 Other specified bacterial agents as the cause of diseases classified elsewhere: Secondary | ICD-10-CM

## 2019-10-08 DIAGNOSIS — N760 Acute vaginitis: Secondary | ICD-10-CM | POA: Diagnosis not present

## 2019-10-08 MED ORDER — METRONIDAZOLE 500 MG PO TABS: 500.0000 mg | ORAL_TABLET | Freq: Two times a day (BID) | ORAL | 0 refills | Status: DC

## 2019-10-08 NOTE — Progress Notes (Signed)
Patient: Diane Roberts Female    DOB: 11/20/1980   39 y.o.   MRN: [id number] Visit Date: 10/08/2019  Today's Provider: Mar Daring, PA-C   No chief complaint on file.  Subjective:    Virtual Visit via Telephone Note  I connected with [name] on 10/08/19 at  5:00 PM EST by telephone and verified that I am speaking with the correct person using two identifiers.  Location: Patient: Work Provider: [facility]   I discussed the limitations, risks, security and privacy concerns of performing an evaluation and management service by telephone and the availability of in person appointments. I also discussed with the patient that there may be a patient responsible charge related to this service. The patient expressed understanding and agreed to proceed.  HPI   [name] is a 39 yr old female that presents via telephone visit today for recurrence of vaginal discharge. Was seen and evaluated on 08/30/19 and found to have BV. This was treated and cleared. Recently starting recurring again like previous. No other symptoms. Has been under increased stress.  No Known Allergies   Current Outpatient Medications:  .  metroNIDAZOLE (FLAGYL) 500 MG tablet, Take 1 tablet (500 mg total) by mouth 2 (two) times daily., Disp: 14 tablet, Rfl: 0 .  propranolol (INDERAL) 20 MG tablet, Take 20 mg by mouth 2 (two) times daily as needed., Disp: , Rfl:   Review of Systems  Constitutional: Negative.   Respiratory: Negative.   Cardiovascular: Negative.   Gastrointestinal: Negative.   Genitourinary: Positive for vaginal discharge. Negative for dysuria, frequency, genital sores, menstrual problem, pelvic pain, vaginal bleeding and vaginal pain.    Social History   Tobacco Use  . Smoking status: Former Smoker    Packs/day: 0.50    Years: 18.00    Pack years: 9.00    Types: Cigarettes    Quit date: 04/08/2017    Years since quitting: 2.5  . Smokeless tobacco: Never Used  Substance  Use Topics  . Alcohol use: Yes    Alcohol/week: 1.0 standard drinks    Types: 1 Cans of beer per week    Comment: occasional      Objective:   There were no vitals taken for this visit. There were no vitals filed for this visit.There is no height or weight on file to calculate BMI.   Physical Exam Vitals reviewed.  Constitutional:      General: She is not in acute distress. Pulmonary:     Effort: No respiratory distress.  Neurological:     Mental Status: She is alert.      No results found for any visits on 10/08/19.     Assessment & Plan    1. BV (bacterial vaginosis) Most likely recurrence of BV. Will treat with metronidazole again as below. Sent information about vaginal probiotics through mychart. Call if recurs. - metroNIDAZOLE (FLAGYL) 500 MG tablet; Take 1 tablet (500 mg total) by mouth 2 (two) times daily.  Dispense: 14 tablet; Refill: 0    I discussed the assessment and treatment plan with the patient. The patient was provided an opportunity to ask questions and all were answered. The patient agreed with the plan and demonstrated an understanding of the instructions.   The patient was advised to call back or seek an in-person evaluation if the symptoms worsen or if the condition fails to improve as anticipated.  I provided 7 minutes of non-face-to-face time during this encounter.  Mar Daring, PA-C  [facility]

## 2019-10-08 NOTE — Telephone Encounter (Signed)
Called to schedule patient for in person or virtual appointment, no answer LMTCB.

## 2019-10-08 NOTE — Telephone Encounter (Signed)
Patient schedule appointment today,10/08/2019 @ 5:00 pm with Tawanna Sat.

## 2019-10-08 NOTE — Patient Instructions (Addendum)
Vaginal Probiotics: RepHresh Pro-B, Raw Probiotics Vaginal care, AZO Complete feminine balance, Renew life womens care ultimate flora  Bacterial Vaginosis  Bacterial vaginosis is a vaginal infection that occurs when the normal balance of bacteria in the vagina is disrupted. It results from an overgrowth of certain bacteria. This is the most common vaginal infection among women ages 30-44. Because bacterial vaginosis increases your risk for STIs (sexually transmitted infections), getting treated can help reduce your risk for chlamydia, gonorrhea, herpes, and HIV (human immunodeficiency virus). Treatment is also important for preventing complications in pregnant women, because this condition can cause an early (premature) delivery. What are the causes? This condition is caused by an increase in harmful bacteria that are normally present in small amounts in the vagina. However, the reason that the condition develops is not fully understood. What increases the risk? The following factors may make you more likely to develop this condition:  Having a new sexual partner or multiple sexual partners.  Having unprotected sex.  Douching.  Having an intrauterine device (IUD).  Smoking.  Drug and alcohol abuse.  Taking certain antibiotic medicines.  Being pregnant. You cannot get bacterial vaginosis from toilet seats, bedding, swimming pools, or contact with objects around you. What are the signs or symptoms? Symptoms of this condition include:  Grey or white vaginal discharge. The discharge can also be watery or foamy.  A fish-like odor with discharge, especially after sexual intercourse or during menstruation.  Itching in and around the vagina.  Burning or pain with urination. Some women with bacterial vaginosis have no signs or symptoms. How is this diagnosed? This condition is diagnosed based on:  Your medical history.  A physical exam of the vagina.  Testing a sample of vaginal  fluid under a microscope to look for a large amount of bad bacteria or abnormal cells. Your health care provider may use a cotton swab or a small wooden spatula to collect the sample. How is this treated? This condition is treated with antibiotics. These may be given as a pill, a vaginal cream, or a medicine that is put into the vagina (suppository). If the condition comes back after treatment, a second round of antibiotics may be needed. Follow these instructions at home: Medicines  Take over-the-counter and prescription medicines only as told by your health care provider.  Take or use your antibiotic as told by your health care provider. Do not stop taking or using the antibiotic even if you start to feel better. General instructions  If you have a female sexual partner, tell her that you have a vaginal infection. She should see her health care provider and be treated if she has symptoms. If you have a female sexual partner, he does not need treatment.  During treatment: ? Avoid sexual activity until you finish treatment. ? Do not douche. ? Avoid alcohol as directed by your health care provider. ? Avoid breastfeeding as directed by your health care provider.  Drink enough water and fluids to keep your urine clear or pale yellow.  Keep the area around your vagina and rectum clean. ? Wash the area daily with warm water. ? Wipe yourself from front to back after using the toilet.  Keep all follow-up visits as told by your health care provider. This is important. How is this prevented?  Do not douche.  Wash the outside of your vagina with warm water only.  Use protection when having sex. This includes latex condoms and dental dams.  Limit how many  sexual partners you have. To help prevent bacterial vaginosis, it is best to have sex with just one partner (monogamous).  Make sure you and your sexual partner are tested for STIs.  Wear cotton or cotton-lined underwear.  Avoid wearing  tight pants and pantyhose, especially during summer.  Limit the amount of alcohol that you drink.  Do not use any products that contain nicotine or tobacco, such as cigarettes and e-cigarettes. If you need help quitting, ask your health care provider.  Do not use illegal drugs. Where to find more information  Centers for Disease Control and Prevention: AppraiserFraud.fi  American Sexual Health Association (ASHA): www.ashastd.org  U.S. Department of Health and Financial controller, Office on Women's Health: DustingSprays.pl or SecuritiesCard.it Contact a health care provider if:  Your symptoms do not improve, even after treatment.  You have more discharge or pain when urinating.  You have a fever.  You have pain in your abdomen.  You have pain during sex.  You have vaginal bleeding between periods. Summary  Bacterial vaginosis is a vaginal infection that occurs when the normal balance of bacteria in the vagina is disrupted.  Because bacterial vaginosis increases your risk for STIs (sexually transmitted infections), getting treated can help reduce your risk for chlamydia, gonorrhea, herpes, and HIV (human immunodeficiency virus). Treatment is also important for preventing complications in pregnant women, because the condition can cause an early (premature) delivery.  This condition is treated with antibiotic medicines. These may be given as a pill, a vaginal cream, or a medicine that is put into the vagina (suppository). This information is not intended to replace advice given to you by your health care provider. Make sure you discuss any questions you have with your health care provider. Document Revised: 07/11/2017 Document Reviewed: 04/13/2016 Elsevier Patient Education  2020 [employer].

## 2019-10-18 ENCOUNTER — Telehealth: Payer: BC Managed Care – PPO | Admitting: Family Medicine

## 2019-10-18 DIAGNOSIS — E663 Overweight: Secondary | ICD-10-CM | POA: Diagnosis not present

## 2019-12-03 DIAGNOSIS — Z20822 Contact with and (suspected) exposure to covid-19: Secondary | ICD-10-CM | POA: Diagnosis not present

## 2019-12-16 DIAGNOSIS — Z20822 Contact with and (suspected) exposure to covid-19: Secondary | ICD-10-CM | POA: Diagnosis not present

## 2020-01-20 ENCOUNTER — Ambulatory Visit (INDEPENDENT_AMBULATORY_CARE_PROVIDER_SITE_OTHER): Payer: BC Managed Care – PPO | Admitting: Family Medicine

## 2020-01-20 ENCOUNTER — Other Ambulatory Visit: Payer: Self-pay

## 2020-01-20 ENCOUNTER — Encounter: Payer: Self-pay | Admitting: Family Medicine

## 2020-01-20 VITALS — BP 102/68 | HR 78 | Temp 96.2°F | Ht 65.0 in | Wt 139.0 lb

## 2020-01-20 DIAGNOSIS — F4322 Adjustment disorder with anxiety: Secondary | ICD-10-CM

## 2020-01-20 DIAGNOSIS — K602 Anal fissure, unspecified: Secondary | ICD-10-CM | POA: Diagnosis not present

## 2020-01-20 MED ORDER — NITROGLYCERIN 0.4 % RE OINT: TOPICAL_OINTMENT | RECTAL | 1 refills | Status: DC

## 2020-01-20 MED ORDER — LIDOCAINE (ANORECTAL) 5 % EX GEL: CUTANEOUS | 1 refills | Status: DC

## 2020-01-20 MED ORDER — SERTRALINE HCL 50 MG PO TABS: 50.0000 mg | ORAL_TABLET | Freq: Every day | ORAL | 3 refills | Status: DC

## 2020-01-20 NOTE — Progress Notes (Addendum)
Established patient visit   Patient: Diane Roberts   DOB: [birth year]   39 y.o. Female  MRN: [id number] Visit Date: 01/20/2020  Today's healthcare provider: Lavon Paganini, MD   Chief Complaint  Patient presents with  . 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 Symptoms include swelling and soreness and tenderness Fissures lasts about 3 weeks  Fissures travel anteriorly toward the vagina Pt has many episodes that come and go Pain limits walking, pt states it hurts to pee Pt has some bleeding while wiping Pt has tried lidocaine for pain  Denies constipation, diarrhea, abdominal pain,  Positive for multiple mouth ulcers during times of stress,   ------------------------------------------------------------------------------------   Anxiety  Pt with new symptoms of anxiety Symptoms include constant worrying, increased stress Pt is worried about her daughter Daughter is dating someone who is involved in shootings and is wanted by the police Pt does not currently know the whereabouts of her daughter Situation has caused the pt incredible stress Pt denies depressed mood, Sleep disturbance, lack of Interest, Guilt, Lack of Energy, Lack of Concentration, and Changes in Appetite, and Suicidal ideation Pt denies distractibility, agitation, talkativeness, increased energy    ------------------------------------------------------------------------------------ Depression screen [facility] 2/9 01/20/2020 08/30/2019 09/09/2013  Decreased Interest 0 0 0  Down, Depressed, Hopeless 1 0 0  PHQ - 2 Score 1 0 0  Altered sleeping 0 0 -  Tired, decreased energy 0 2 -  Change in appetite 0 0 -  Feeling bad or failure about yourself  0 0 -  Trouble concentrating 0 0 -  Moving slowly or fidgety/restless 0 0 -  Suicidal thoughts 0 0 -  PHQ-9 Score 1 2 -  Difficult doing work/chores Not difficult at all Not difficult at all -     GAD 7 : Generalized Anxiety Score 01/20/2020  Nervous, Anxious, on Edge 1  Control/stop worrying 1  Worry too much - different things 1  Trouble relaxing 0  Restless 0  Easily annoyed or irritable 1  Afraid - awful might happen 1  Total GAD 7 Score 5  Anxiety Difficulty Not difficult at all    Social History   Tobacco Use  . Smoking status: Former Smoker    Packs/day: 0.50    Years: 18.00    Pack years: 9.00    Types: Cigarettes    Quit date: 04/08/2017    Years since quitting: 2.7  . Smokeless tobacco: Never Used  Vaping Use  . Vaping Use: Some days  . Substances: Nicotine, Flavoring  Substance Use Topics  . Alcohol use: Yes    Alcohol/week: 1.0 standard drink    Types: 1 Cans of beer per week    Comment: occasional  . Drug use: No   Social History   Socioeconomic History  . Marital status: Single    Spouse name: Not on file  . Number of children: 2  . Years of education: Not on file  . Highest education level: Not on file  Occupational History  . Occupation: quality lead  Tobacco Use  . Smoking status: Former Smoker    Packs/day: 0.50    Years: 18.00    Pack years: 9.00    Types: Cigarettes    Quit date: 04/08/2017    Years since quitting: 2.7  . Smokeless tobacco: Never Used  Vaping Use  . Vaping Use: Some days  . Substances:  Nicotine, Flavoring  Substance and Sexual Activity  . Alcohol use: Yes    Alcohol/week: 1.0 standard drink    Types: 1 Cans of beer per week    Comment: occasional  . Drug use: No  . Sexual activity: Yes    Partners: Male    Comment: ablation  Other Topics Concern  . Not on file  Social History Narrative  . Not on file   Social Determinants of Health   Financial Resource Strain:   . Difficulty of Paying Living Expenses:   Food Insecurity:   . Worried About Charity fundraiser in the Last Year:   . Arboriculturist in the Last Year:   Transportation Needs:   . Film/video editor (Medical):   Marland Kitchen Lack of  Transportation (Non-Medical):   Physical Activity:   . Days of Exercise per Week:   . Minutes of Exercise per Session:   Stress:   . Feeling of Stress :   Social Connections:   . Frequency of Communication with Friends and Family:   . Frequency of Social Gatherings with Friends and Family:   . Attends Religious Services:   . Active Member of Clubs or Organizations:   . Attends Archivist Meetings:   Marland Kitchen Marital Status:   Intimate Partner Violence:   . Fear of Current or Ex-Partner:   . Emotionally Abused:   Marland Kitchen Physically Abused:   . Sexually Abused:        Medications: Outpatient Medications Prior to Visit  Medication Sig  . propranolol (INDERAL) 20 MG tablet Take 20 mg by mouth 2 (two) times daily as needed.  . metroNIDAZOLE (FLAGYL) 500 MG tablet Take 1 tablet (500 mg total) by mouth 2 (two) times daily.   No facility-administered medications prior to visit.    Review of Systems  Constitutional: Negative.   HENT: Positive for mouth sores.   Eyes: Negative.   Respiratory: Negative.   Cardiovascular: Negative.   Gastrointestinal: Positive for diarrhea. Negative for abdominal distention, abdominal pain, anal bleeding, blood in stool, constipation, nausea, rectal pain and vomiting.  Endocrine: Negative.   Genitourinary: Positive for dysuria.  Musculoskeletal: Negative.   Allergic/Immunologic: Negative.   Neurological: Negative.   Hematological: Negative.   Psychiatric/Behavioral: The patient is nervous/anxious.     Last CBC Lab Results  Component Value Date   WBC 5.1 02/05/2019   HGB 14.4 02/05/2019   HCT 41 02/05/2019   MCV 93.4 05/05/2018   MCH 33.0 05/05/2018   RDW 12.9 05/05/2018   PLT 170 39/76/7341   Last metabolic panel Lab Results  Component Value Date   GLUCOSE 96 04/14/2018   NA 140 02/05/2019   K 3.8 02/05/2019   CL 105 02/05/2019   CO2 29 (A) 02/05/2019   BUN 10 02/05/2019   CREATININE 0.8 02/05/2019   GFRNONAA 81 02/05/2019   GFRAA  >60 04/14/2018   CALCIUM 9.2 02/05/2019   PROT 6.6 04/14/2018   ALBUMIN 4.2 02/05/2019   BILITOT 0.9 04/14/2018   ALKPHOS 46 02/05/2019   AST 13 02/05/2019   ALT 11 02/05/2019   ANIONGAP 7 04/14/2018   Last lipids Lab Results  Component Value Date   CHOL 160 02/05/2019   HDL 59 02/05/2019   TRIG 58 02/05/2019      Objective    BP 102/68 (BP Location: Left Arm, Patient Position: Sitting, Cuff Size: Large)   Pulse 78   Temp (!) 96.2 F (35.7 C) (Temporal)   Ht 5'  5" (1.651 m)   Wt 139 lb (63 kg)   SpO2 99%   BMI 23.13 kg/m  BP Readings from Last 3 Encounters:  01/20/20 102/68  08/30/19 111/72  03/09/19 122/74   Wt Readings from Last 3 Encounters:  01/20/20 139 lb (63 kg)  08/30/19 156 lb (70.8 kg)  03/09/19 149 lb (67.6 kg)      Physical Exam Exam conducted with a chaperone present.  Constitutional:      Appearance: Normal appearance. She is normal weight.  HENT:     Head: Normocephalic and atraumatic.     Nose: Nose normal. No congestion or rhinorrhea.     Mouth/Throat:     Lips: Pink. No lesions.     Mouth: Mucous membranes are moist. No injury, lacerations, oral lesions or angioedema.     Dentition: Normal dentition. No gingival swelling, dental caries, dental abscesses or gum lesions.     Tongue: No lesions. Tongue does not deviate from midline.     Palate: No mass and lesions.     Pharynx: Oropharynx is clear. Uvula midline. No pharyngeal swelling, oropharyngeal exudate, posterior oropharyngeal erythema or uvula swelling.  Cardiovascular:     Rate and Rhythm: Normal rate and regular rhythm.     Pulses: Normal pulses.     Heart sounds: Normal heart sounds.  Pulmonary:     Effort: Pulmonary effort is normal.     Breath sounds: Normal breath sounds.  Abdominal:     General: Abdomen is flat. Bowel sounds are normal. There is no distension.     Palpations: Abdomen is soft. There is no hepatomegaly or mass.     Tenderness: There is no abdominal  tenderness. There is no right CVA tenderness, left CVA tenderness, guarding or rebound.     Hernia: No hernia is present.  Genitourinary:    Exam position: Knee-chest position.     Comments: Anterior anal fissure Musculoskeletal:     Cervical back: Normal range of motion and neck supple. No rigidity or tenderness.     Right lower leg: No edema.     Left lower leg: No edema.  Lymphadenopathy:     Cervical: No cervical adenopathy.  Skin:    General: Skin is warm and dry.     Capillary Refill: Capillary refill takes less than 2 seconds.     Coloration: Skin is not jaundiced or pale.     Findings: No bruising, erythema, lesion or rash.  Neurological:     General: No focal deficit present.     Mental Status: She is alert and oriented to person, place, and time.  Psychiatric:        Mood and Affect: Mood normal.        Behavior: Behavior normal.      No results found for any visits on 01/20/20.  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 Fissures are anterior and painful Pt denies associated symptoms of crohn's However, frequent recurrence of anal fissures raises suspicion of Crohn's disease  Plan: Referral to GI for management of fissure and evaluation for Crohn's Start lidocaine topical for pain Start nitro topical for increased healing      Relevant Orders   Ambulatory referral to Gastroenterology     Other   Adjustment disorder with anxious mood    Pt with new  symptoms of anxiety  Related to daughter being involved in shootings and police searches Pt denies depression or mania Symptoms most likely due to adjustment disorder with anxiety  Plan: Will start on zoloft Will refer to psychiatry Will follow up in 6 weeks      Relevant Orders   Ambulatory referral to Psychology       Return in about 6 weeks (around 03/02/2020) for anxiety f/u.       Chipper Herb, Medical Student [facility] [place] [phone] (phone) [phone] (fax)  [place]

## 2020-01-20 NOTE — Assessment & Plan Note (Addendum)
Pt with new symptoms of anxiety  Related to daughter being involved in shootings and police searches Pt denies depression or mania Symptoms most likely due to adjustment disorder with anxiety  Plan: Will start on zoloft 50 mg daily Will refer to therapy Will follow up in 6 weeks Discussed potential side effects, incl GI upset, sexual dysfunction, increased anxiety, and SI Discussed that it can take 6-8 weeks to reach full efficacy Contracted for safety - no SI/HI Discussed synergistic effects of medications and therapy  Repeat PHQ-9 and GAD-7 at next visit

## 2020-01-20 NOTE — Progress Notes (Signed)
See note from Chipper Herb, medical student, attested by me from same date of service

## 2020-01-20 NOTE — Patient Instructions (Signed)
Anal Fissure, Adult  An anal fissure is a small tear or crack in the tissue of the anus. Bleeding from a fissure usually stops on its own within a few minutes. However, bleeding will often occur again with each bowel movement until the fissure heals. What are the causes? This condition is usually caused by passing a large or hard stool (feces). Other causes include:  Constipation.  Frequent diarrhea.  Inflammatory bowel disease (Crohn's disease or ulcerative colitis).  Childbirth.  Infections.  Anal sex. What are the signs or symptoms? Symptoms of this condition include:  Bleeding from the rectum.  Small amounts of blood seen on your stool, on the toilet paper, or in the toilet after a bowel movement. The blood coats the outside of the stool and is not mixed with the stool.  Painful bowel movements.  Itching or irritation around the anus. How is this diagnosed? A health care provider may diagnose this condition by closely examining the anal area. An anal fissure can usually be seen with careful inspection. In some cases, a rectal exam may be performed, or a short tube (anoscope) may be used to examine the anal canal. How is this treated? Initial treatment for this condition may include:  Taking steps to avoid constipation. This may include making changes to your diet, such as increasing your intake of fiber or fluid.  Taking fiber supplements. These supplements can soften your stool to help make bowel movements easier. Your health care provider may also prescribe a stool softener if your stool is hard.  Taking sitz baths. This may help to heal the tear.  Using medicated creams or ointments. These may be prescribed to lessen discomfort. Treatments that are sometimes used if initial treatments do not work well or if the condition is more severe may include:  Botulinum injection.  Surgery to repair the fissure. Follow these instructions at home: Eating and  drinking   Avoid foods that may cause constipation, such as bananas, milk, and other dairy products.  Eat all fruits, except bananas.  Drink enough fluid to keep your urine pale yellow.  Eat foods that are high in fiber, such as beans, whole grains, and fresh fruits and vegetables. General instructions   Take over-the-counter and prescription medicines only as told by your health care provider.  Use creams or ointments only as told by your health care provider.  Keep the anal area clean and dry.  Take sitz baths as told by your health care provider. Do not use soap in the sitz baths.  Keep all follow-up visits as told by your health care provider. This is important. Contact a health care provider if you have:  More bleeding.  A fever.  Diarrhea that is mixed with blood.  Pain that continues.  Ongoing problems that are getting worse rather than better. Summary  An anal fissure is a small tear or crack in the tissue of the anus. This condition is usually caused by passing a large or hard stool (feces). Other causes include constipation and frequent diarrhea.  Initial treatment for this condition may include taking steps to avoid constipation, such as increasing your intake of fiber or fluid.  Follow instructions for care as told by your health care provider.  Contact your health care provider if you have more bleeding or your problem is getting worse rather than better.  Keep all follow-up visits as told by your health care provider. This is important. This information is not intended to replace advice given   to you by your health care provider. Make sure you discuss any questions you have with your health care provider. Document Revised: 01/08/2018 Document Reviewed: 01/08/2018 Elsevier Patient Education  2020 Elsevier Inc.  

## 2020-01-20 NOTE — Assessment & Plan Note (Signed)
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

## 2020-02-15 DIAGNOSIS — Z713 Dietary counseling and surveillance: Secondary | ICD-10-CM | POA: Diagnosis not present

## 2020-02-18 IMAGING — CT CT RENAL STONE PROTOCOL
2 of 4 series · 16 of 46 positions shown, 18 images · non-contrast
Comparison: CT the abdomen and pelvis 02/19/2009.

CLINICAL DATA: 37-year-old female with severe pelvic pain and
burning with urination.

EXAM:
CT ABDOMEN AND PELVIS WITHOUT CONTRAST
TECHNIQUE: Multidetector CT imaging of the abdomen and pelvis was performed
following the standard protocol without IV contrast.

[Series 2: stone full standard · axial · 0.66mm/px · z∈[-483,-93]mm · 13 of 86 slices shown, 15 images]
[im 4/86  soft-tissue]
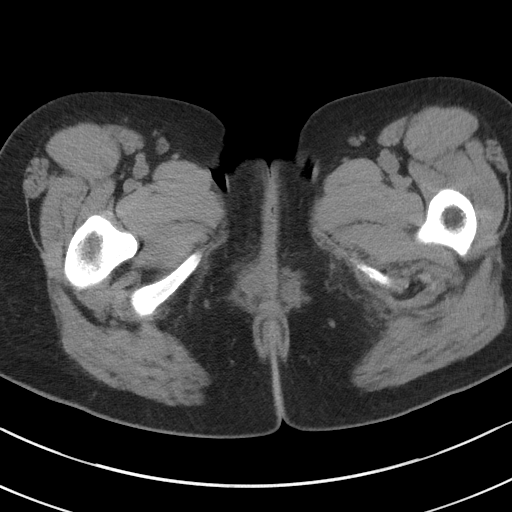
[im 4/86  bone]
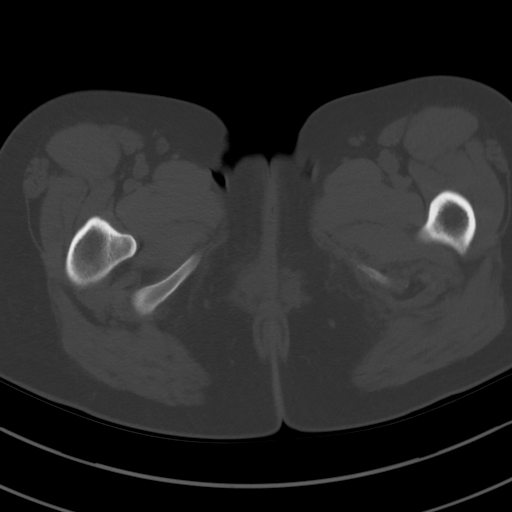
[im 11/86  soft-tissue]
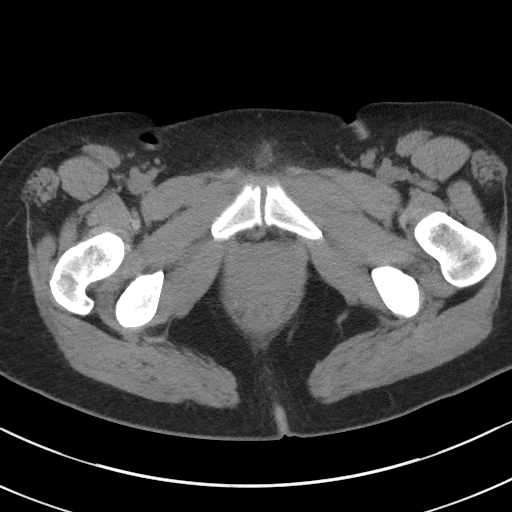
[im 18/86  soft-tissue]
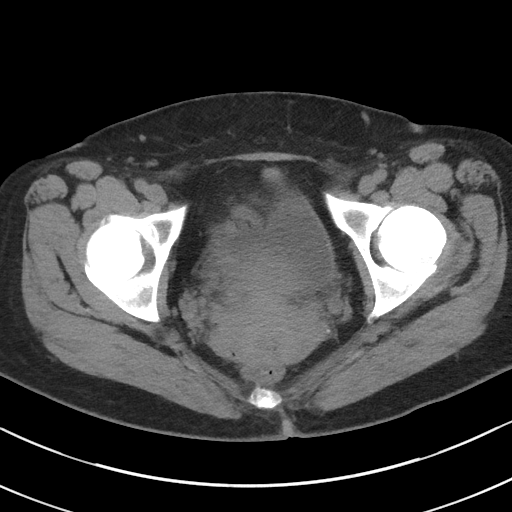
[im 25/86  soft-tissue]
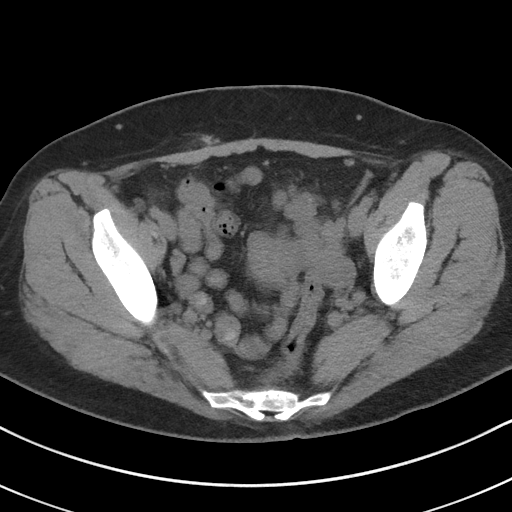
[im 29/86  soft-tissue]
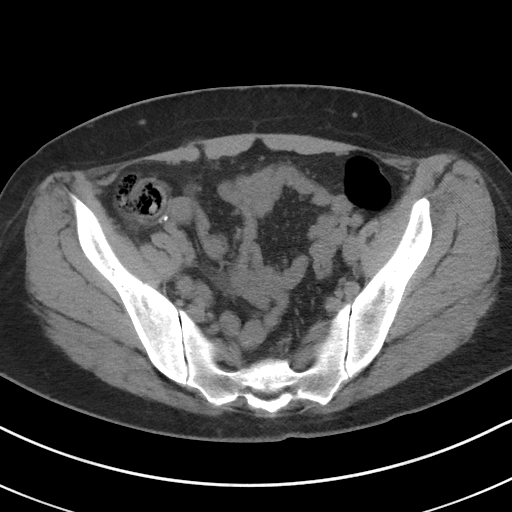
[im 36/86  soft-tissue]
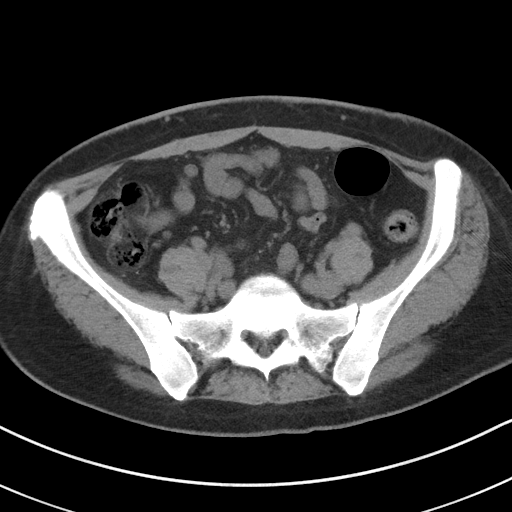
[im 43/86  soft-tissue]
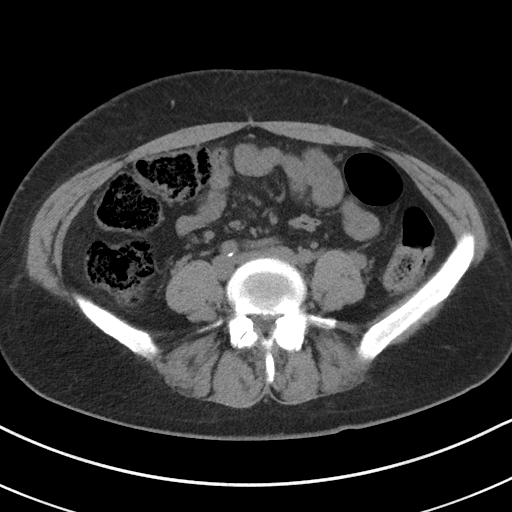
[im 50/86  soft-tissue]
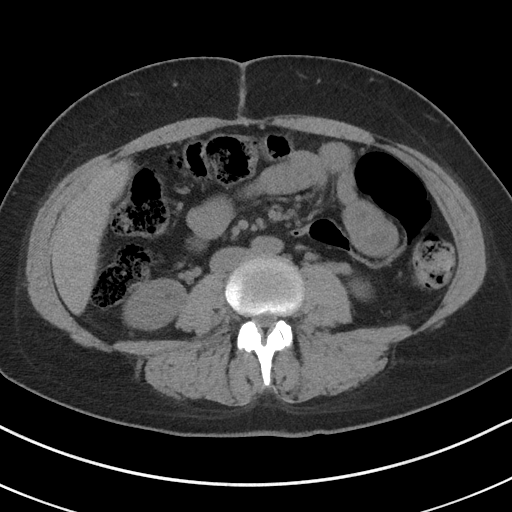
[im 57/86  soft-tissue]
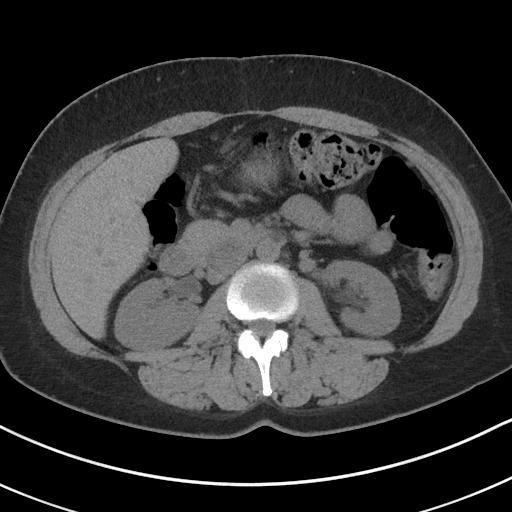
[im 57/86  bone]
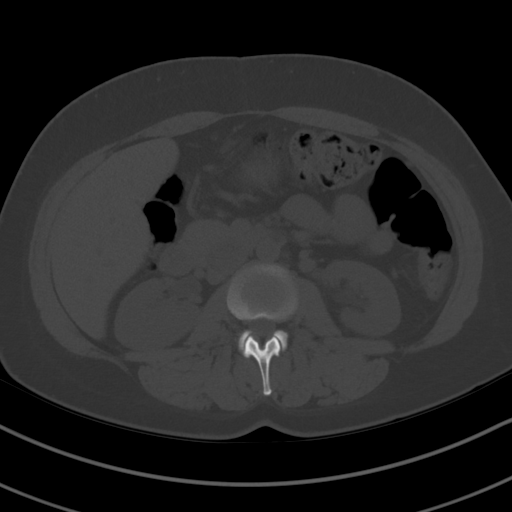
[im 61/86  soft-tissue]
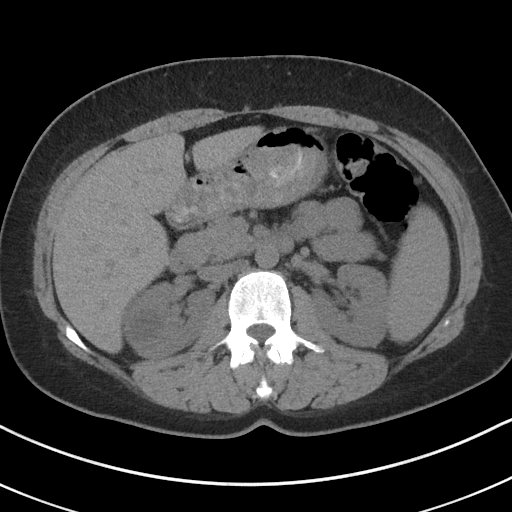
[im 68/86  soft-tissue]
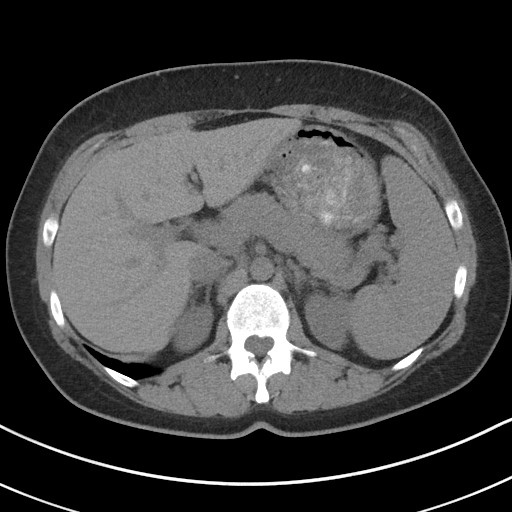
[im 75/86  soft-tissue]
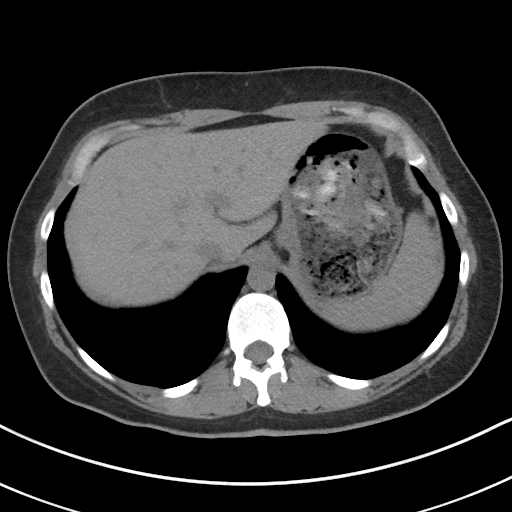
[im 82/86  soft-tissue]
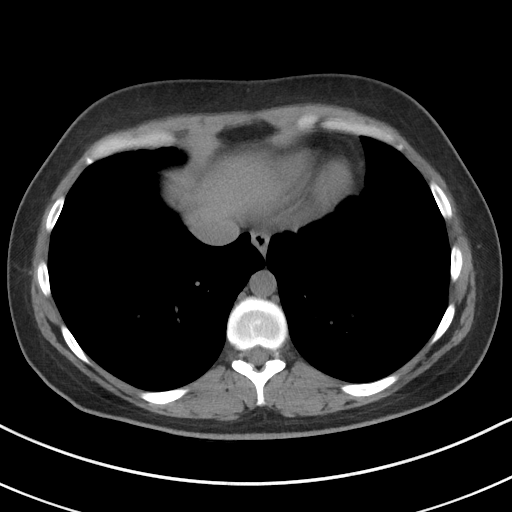

[Series 5: coronal · coronal · 0.69mm/px · 3 of 129 slices shown]
[im 43/129  soft-tissue]
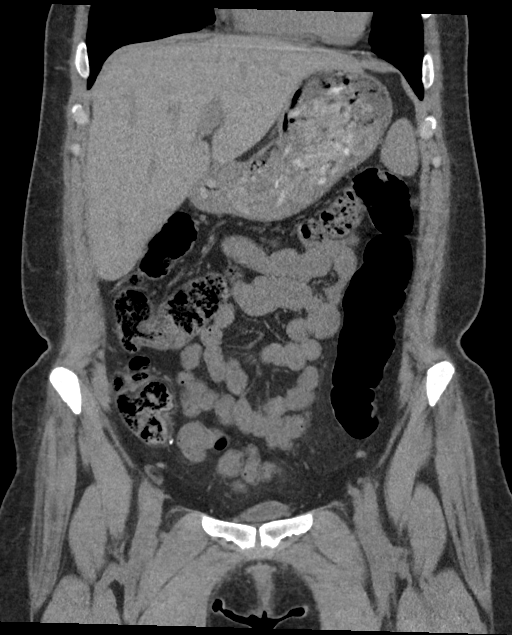
[im 57/129  soft-tissue]
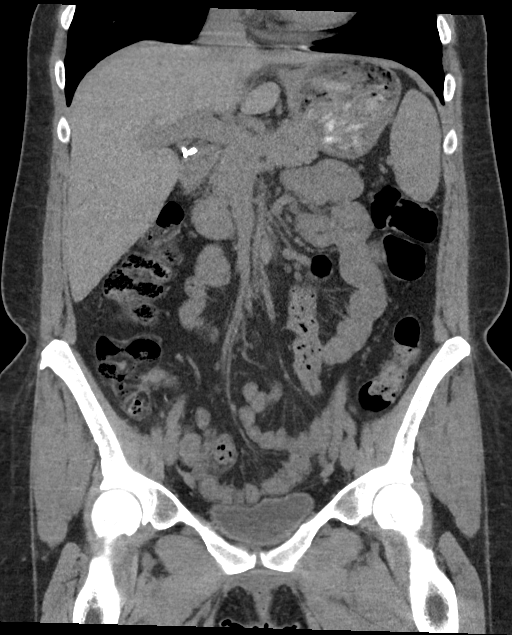
[im 72/129  soft-tissue]
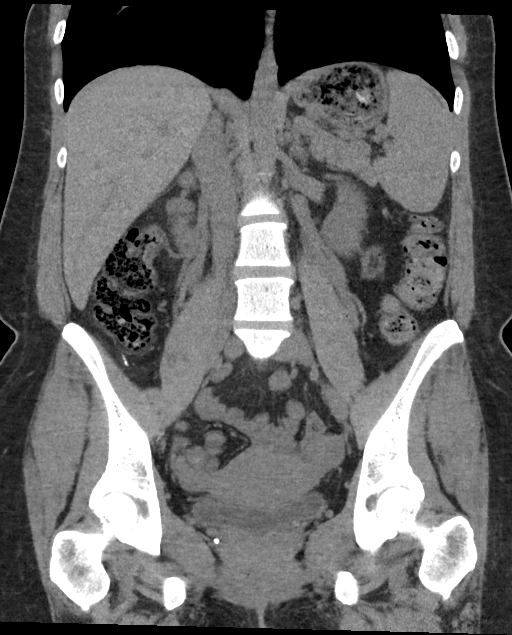

[16 of 46 positions shown; findings below may reference images not displayed]

FINDINGS: Lower chest: Unremarkable.

Hepatobiliary: No suspicious cystic or solid hepatic lesions are
confidently identified on today's noncontrast CT examination. Status
post cholecystectomy.

Pancreas: No definite pancreatic mass or peripancreatic fluid or
inflammatory changes.

Spleen: Unremarkable.

Adrenals/Urinary Tract: Numerous nonobstructive calculi are noted
within the collecting systems of both kidneys, largest of which
measures 4 mm in the interpolar region of the right kidney. No
calculi are noted within either ureter or the urinary bladder. No
hydroureteronephrosis. In the interpolar region of the right kidney
there is a 2.7 cm low-attenuation lesion, which is incompletely
characterized on today's noncontrast CT examination, but previously
was characterized as a simple cyst. Left kidney is normal in
appearance. 1.3 cm low-attenuation (3 HU) left adrenal nodule,
compatible with an adenoma. Right adrenal gland is normal in
appearance. Urinary bladder is normal in appearance.

Stomach/Bowel: Normal appearance of the stomach. No pathologic
dilatation of small bowel or colon. Status post appendectomy.

Vascular/Lymphatic: Mild atherosclerotic calcifications in the
pelvic vasculature. No lymphadenopathy noted in the abdomen or
pelvis.

Reproductive: Uterus and ovaries are unremarkable in appearance.

Other: No significant volume of ascites.  No pneumoperitoneum.

Musculoskeletal: There are no aggressive appearing lytic or blastic
lesions noted in the visualized portions of the skeleton.
IMPRESSION: 1. Numerous nonobstructive calculi are noted within the collecting
systems of both kidneys, largest of which measures 4 mm in the
interpolar region of the right kidney. No ureteral stones. No
hydroureteronephrosis to suggest urinary tract obstruction.
2. No acute findings are noted in the abdomen or pelvis.
3. Small left adrenal adenoma.
4. Mild atherosclerosis.

## 2020-03-02 ENCOUNTER — Encounter: Payer: Self-pay | Admitting: Family Medicine

## 2020-03-16 DIAGNOSIS — Z713 Dietary counseling and surveillance: Secondary | ICD-10-CM | POA: Diagnosis not present

## 2020-03-21 ENCOUNTER — Ambulatory Visit: Payer: BC Managed Care – PPO | Admitting: Physician Assistant

## 2020-03-21 ENCOUNTER — Encounter: Payer: Self-pay | Admitting: Physician Assistant

## 2020-03-21 ENCOUNTER — Other Ambulatory Visit: Payer: Self-pay

## 2020-03-21 ENCOUNTER — Other Ambulatory Visit (HOSPITAL_COMMUNITY)
Admission: RE | Admit: 2020-03-21 | Discharge: 2020-03-21 | Disposition: A | Discharge status: Home / Self Care | Payer: BC Managed Care – PPO | Source: Ambulatory Visit | Attending: Family Medicine | Admitting: Family Medicine

## 2020-03-21 VITALS — BP 106/75 | HR 85 | Temp 98.4°F | Ht 64.0 in | Wt 139.4 lb

## 2020-03-21 DIAGNOSIS — R87610 Atypical squamous cells of undetermined significance on cytologic smear of cervix (ASC-US): Secondary | ICD-10-CM | POA: Diagnosis not present

## 2020-03-21 DIAGNOSIS — N870 Mild cervical dysplasia: Secondary | ICD-10-CM

## 2020-03-21 DIAGNOSIS — B373 Candidiasis of vulva and vagina: Secondary | ICD-10-CM | POA: Diagnosis not present

## 2020-03-21 DIAGNOSIS — Z1151 Encounter for screening for human papillomavirus (HPV): Secondary | ICD-10-CM | POA: Insufficient documentation

## 2020-03-21 DIAGNOSIS — N898 Other specified noninflammatory disorders of vagina: Secondary | ICD-10-CM

## 2020-03-21 DIAGNOSIS — B3731 Acute candidiasis of vulva and vagina: Secondary | ICD-10-CM

## 2020-03-21 DIAGNOSIS — R87810 Cervical high risk human papillomavirus (HPV) DNA test positive: Secondary | ICD-10-CM | POA: Diagnosis not present

## 2020-03-21 DIAGNOSIS — N912 Amenorrhea, unspecified: Secondary | ICD-10-CM | POA: Diagnosis not present

## 2020-03-21 NOTE — Progress Notes (Addendum)
Established patient visit   Patient: Diane Roberts   DOB: 1981-03-02   39 y.o. Female  MRN: [id number] Visit Date: 03/21/2020  Today's healthcare provider: Trinna Post, PA-C   Chief Complaint  Patient presents with  . Vaginal Discharge   Subjective    HPI  Vaginal Discharge Patient reports that she is having a heavy amount of white and clear discharge that has been ongoing for 1 year. She has had an episode of bacterial vaginitis earlier this year that was treated with metronidazole. She reports symptoms improved for a short time afterwards but then improved. She reports some clear discharge that is without odor. She denies new sexual partner or concern for STI. Denies pelvic pain, itching, fevers, chills.   She also reports a history of amenorrhea. She has had an endometrial ablation historically. She also reports she was told by one gynecologist that she was in early menopause. She ultimately switched gynecologists. She has been having some vaginal bleeding recently and wonders if this is an ovarian cyst.   Additionally, she had a colposcopy last year at [facility] which showed CIN-1 with instructions to have repeat PAP smear in one year.      Medications: Outpatient Medications Prior to Visit  Medication Sig  . Lidocaine, Anorectal, 5 % GEL Use twice daily prn  . Nitroglycerin 0.4 % OINT Use topically BID  . propranolol (INDERAL) 20 MG tablet Take 20 mg by mouth 2 (two) times daily as needed.  . sertraline (ZOLOFT) 50 MG tablet Take 1 tablet (50 mg total) by mouth daily.   No facility-administered medications prior to visit.    Review of Systems  Genitourinary: Positive for vaginal discharge. Negative for decreased urine volume, difficulty urinating, dysuria, flank pain, frequency, hematuria, menstrual problem and urgency.      Objective    BP 106/75   Pulse 85   Temp 98.4 F (36.9 C)   Ht 5\' 4"  (1.626 m)   Wt 139 lb 6.4 oz (63.2 kg)   BMI 23.93 kg/m     Physical Exam Constitutional:      Appearance: Normal appearance.  Cardiovascular:     Rate and Rhythm: Normal rate.  Pulmonary:     Effort: Pulmonary effort is normal.  Genitourinary:    Vagina: Vaginal discharge present.     Comments: Some white vaginal discharge.  Skin:    General: Skin is warm and dry.  Neurological:     Mental Status: She is alert and oriented to person, place, and time. Mental status is at baseline.       No results found for any visits on 03/21/20.  Assessment & Plan    1. Vaginal discharge  Test as below. Recommend metronidazole for BV or diflucan for yeast, pending results.   Swab positive for candida glabrata. Sent in diflucan 150 mg #2.   - Cervicovaginal ancillary only - Cytology - PAP  2. Dysplasia of cervix, low grade (CIN 1)  Follow up with PAP.  3. Amenorrhea  May be endometrial ablation wearing off. Counseled she would not have vaginal bleeding from ovarian source. Recommend f/u w/ OBGYN.     No follow-ups on file.      ITrinna Post, PA-C, have reviewed all documentation for this visit. The documentation on 03/21/20 for the exam, diagnosis, procedures, and orders are all accurate and complete.    Paulene Floor  [facility] [phone] (phone) [phone] (fax)  [facility]

## 2020-03-22 LAB — CERVICOVAGINAL ANCILLARY ONLY
Bacterial Vaginitis (gardnerella): NEGATIVE
Candida Glabrata: POSITIVE — AB
Candida Vaginitis: NEGATIVE
Chlamydia: NEGATIVE
Comment: NEGATIVE
Comment: NEGATIVE
Comment: NEGATIVE
Comment: NEGATIVE
Comment: NEGATIVE
Comment: NORMAL
Neisseria Gonorrhea: NEGATIVE
Trichomonas: NEGATIVE

## 2020-03-23 ENCOUNTER — Telehealth: Payer: Self-pay

## 2020-03-23 MED ORDER — FLUCONAZOLE 150 MG PO TABS: ORAL_TABLET | ORAL | 0 refills | Status: DC

## 2020-03-23 NOTE — Telephone Encounter (Signed)
Advised patient that her swab came back positive for a yeast infection. Patient wanted to know what could she do to prevent these from happening? She reports that she is having yeast infections very frequently. Please advise. Thanks!

## 2020-03-23 NOTE — Telephone Encounter (Signed)
Copied from [place] [id number]. Topic: General - Other >> Mar 23, 2020 12:13 PM Rainey Pines A wrote: Patient would like a callback from nurse to go over r lab resutls

## 2020-03-23 NOTE — Addendum Note (Signed)
Addended by: Trinna Post on: 03/23/2020 08:31 AM   Modules accepted: Orders

## 2020-03-23 NOTE — Telephone Encounter (Signed)
Please review

## 2020-03-24 NOTE — Telephone Encounter (Signed)
I don't see any other documented yeast infections only BV. Wouldn't recommend prophylaxis at this time though it is something she can certainly discuss with her PCP.

## 2020-03-27 LAB — CYTOLOGY - PAP
Comment: NEGATIVE
Comment: NEGATIVE
Diagnosis: UNDETERMINED — AB
HPV 16: NEGATIVE
HPV 18 / 45: POSITIVE — AB
High risk HPV: POSITIVE — AB

## 2020-03-28 NOTE — Telephone Encounter (Signed)
Left message to call back. Ok for [facility] to advise patient.

## 2020-04-03 ENCOUNTER — Ambulatory Visit: Payer: BC Managed Care – PPO | Admitting: Gastroenterology

## 2020-04-07 ENCOUNTER — Ambulatory Visit: Payer: Self-pay

## 2020-04-07 NOTE — Telephone Encounter (Signed)
Patient called and says she's been having random episodes of dizziness lasting a few seconds. She says yesterday she was at work walking and she had to stop and hold on to her friend because she all of a sudden felt dizzy like she was about to pass out. She says after a few seconds it passed, but then she felt tingling all over her body that lasted about 30 minutes, then her body felt weird, could not explain the weird feeling, which lasted about 3 hours. She says then everything was back to normal. She says today about 3 hours ago she was getting out the car at her hair dresser and she felt the dizziness again, lasting a few seconds then it went away. She says she didn't feel the tingling or the weird feeling this time. She says her hair dresser told her that her hair was coming out more than it should and she should let her doctor know. She denies any symptoms and no dizziness at this time. Appointment scheduled for Wednesday. 04/12/20 at 1040 with Dr. Brita Romp, care advice given, she verbalized understanding.  Reason for Disposition  [1] MILD dizziness (e.g., walking normally) AND [2] has NOT been evaluated by physician for this  (Exception: dizziness caused by heat exposure, sudden standing, or poor fluid intake)  Answer Assessment - Initial Assessment Questions 1. DESCRIPTION: "Describe your dizziness."     Felt like I was going to pass out, then a tingly feeling about 30 minutes, then felt weird for a few hours. Happened again this morning 2. LIGHTHEADED: "Do you feel lightheaded?" (e.g., somewhat faint, woozy, weak upon standing)     Yes 3. VERTIGO: "Do you feel like either you or the room is spinning or tilting?" (i.e. vertigo)     Fine right now, but it did feel like that when it happened 3 hours ago and yesterday 4. SEVERITY: "How bad is it?"  "Do you feel like you are going to faint?" "Can you stand and walk?"   - MILD: Feels slightly dizzy, but walking normally.   - MODERATE: Feels very  unsteady when walking, but not falling; interferes with normal activities (e.g., school, work) .   - SEVERE: Unable to walk without falling, or requires assistance to walk without falling; feels like passing out now.      Moderate while it happened 5. ONSET:  "When did the dizziness begin?"     Yesterday 6. AGGRAVATING FACTORS: "Does anything make it worse?" (e.g., standing, change in head position)     It just randomly happens 7. HEART RATE: "Can you tell me your heart rate?" "How many beats in 15 seconds?"  (Note: not all patients can do this)       N/A 8. CAUSE: "What do you think is causing the dizziness?"     I don't know 9. RECURRENT SYMPTOM: "Have you had dizziness before?" If Yes, ask: "When was the last time?" "What happened that time?"     No 10. OTHER SYMPTOMS: "Do you have any other symptoms?" (e.g., fever, chest pain, vomiting, diarrhea, bleeding)     No except tingling after the dizzy episode 11. PREGNANCY: "Is there any chance you are pregnant?" "When was your last menstrual period?"      No  Protocols used: DIZZINESS [facility]

## 2020-04-12 ENCOUNTER — Other Ambulatory Visit: Payer: Self-pay

## 2020-04-12 ENCOUNTER — Ambulatory Visit: Payer: BC Managed Care – PPO | Admitting: Family Medicine

## 2020-04-12 ENCOUNTER — Encounter: Payer: Self-pay | Admitting: Family Medicine

## 2020-04-12 VITALS — BP 104/72 | HR 153 | Temp 98.4°F | Resp 16 | Ht 65.0 in | Wt 138.8 lb

## 2020-04-12 DIAGNOSIS — R42 Dizziness and giddiness: Secondary | ICD-10-CM | POA: Diagnosis not present

## 2020-04-12 DIAGNOSIS — Z23 Encounter for immunization: Secondary | ICD-10-CM

## 2020-04-12 NOTE — Progress Notes (Signed)
Established patient visit   Patient: Diane Roberts   DOB: 07-02-1981   39 y.o. Female  MRN: [id number] Visit Date: 04/12/2020  Today's healthcare provider: Lavon Paganini, MD   Chief Complaint  Patient presents with  . Dizziness   Subjective    HPI  Ms. Vaillancourt presents for episodes of dizziness and increased hair loss.  She has had some feelings of lightheadedness in the past, but these only last a few moments and she feels normal afterwards.   Last week was at work she was walking through the warehouse and all of a suddent the world started to feel spinny. She felt like she was going to pass out and had a tingling feeling al over. This last for 5 minutes. During this, she apparently looked panicked/frozen to her coworkers. She has headaches with this. No changes in vision or seeing stars/black, no passing out, no palpitations or chest pain or tightness during the episode. After the episode, she felt weird for about 3 hours.  On Friday morning she woke up feeling exhausted. She went to the hairdresser, and getting out of her car had to stop for a minute because she started to feel weird again, similar to the previous day. This continued but she was able to continue with her haircut, and she felt fine after a couple of days.  Her hairdresser noted she was losing gobs of hair. She reports this has been going on for a couple of weeks.  She has had no nausea or vomiting, no palpitations, no numbness or tingling outside of these episodes, no hearing changes.  Patient Active Problem List   Diagnosis Date Noted  . Dysplasia of cervix, low grade (CIN 1) 03/21/2020  . Adjustment disorder with anxious mood 01/20/2020  . Anal fissure 01/20/2020  . History of anal fissures 08/30/2019  . Chronic pain of left knee 08/30/2019  . Synovial cyst of left popliteal space 08/30/2019  . Pelvic pain 04/21/2018  . Uterine leiomyoma 04/21/2018  . Leukorrhea 04/21/2018  . Frequent headaches  09/09/2013   Past Medical History:  Diagnosis Date  . Abnormal Pap smear of cervix   . Anemia    after delivery  . Breast mass 2018   bil masses felt by MD  . Frequent headaches   . History of kidney stones    currently as of 04-2018 bil  . 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 Marital status: Single    Spouse name: Not on file  . Number of children: 2  . Years of education: Not on file  . Highest education level: Not on file  Occupational History  . Occupation: quality lead  Tobacco Use  . Smoking status: Former Smoker    Packs/day: 0.50    Years: 18.00    Pack years: 9.00    Types: Cigarettes    Quit date: 04/08/2017    Years since quitting: 3.0  . Smokeless tobacco: Never Used  Vaping Use  . Vaping Use: Every day  . Substances: Nicotine, Flavoring  Substance and Sexual Activity  . Alcohol use: Yes    Alcohol/week: 1.0 standard drink    Types: 1 Cans of beer per week    Comment: occasional  . Drug use: No  . Sexual activity: Yes    Partners: Male    Comment: ablation  Other Topics Concern  . Not on file  Social History Narrative  .  Not on file   Social Determinants of Health   Financial Resource Strain:   . Difficulty of Paying Living Expenses: Not on file  Food Insecurity:   . Worried About Charity fundraiser in the Last Year: Not on file  . Ran Out of Food in the Last Year: Not on file  Transportation Needs:   . Lack of Transportation (Medical): Not on file  . Lack of Transportation (Non-Medical): Not on file  Physical Activity:   . Days of Exercise per Week: Not on file  . Minutes of Exercise per Session: Not on file  Stress:   . Feeling of Stress : Not on file  Social Connections:   . Frequency of Communication with Friends and Family: Not on file  . Frequency of Social Gatherings with Friends and Family: Not on file  . Attends Religious Services: Not on file  . Active Member of Clubs or Organizations: Not on  file  . Attends Archivist Meetings: Not on file  . Marital Status: Not on file  Intimate Partner Violence:   . Fear of Current or Ex-Partner: Not on file  . Emotionally Abused: Not on file  . Physically Abused: Not on file  . Sexually Abused: Not on file   No Known Allergies   Family History  Problem Relation Age of Onset  . Hypertension Mother   . Kidney disease Mother   . Hyperlipidemia Mother   . Thyroid disease Mother   . COPD Mother   . Osteoporosis Mother   . Hypertension Father   . Clotting disorder Maternal Grandmother        reports she died of a blood clot  . COPD Maternal Grandfather   . Gout Maternal Grandfather   . Heart failure Maternal Grandfather   . Heart failure Paternal Grandfather   . Hypertension Paternal Grandfather   . COPD Paternal Grandfather   . Breast cancer Neg Hx   . Ovarian cancer Neg Hx   . Colon cancer Neg Hx     Mother had thyroid cancer with resection.  Medications: Outpatient Medications Prior to Visit  Medication Sig  . Lidocaine, Anorectal, 5 % GEL Use twice daily prn  . Nitroglycerin 0.4 % OINT Use topically BID  . propranolol (INDERAL) 20 MG tablet Take 20 mg by mouth 2 (two) times daily as needed.  . sertraline (ZOLOFT) 50 MG tablet Take 1 tablet (50 mg total) by mouth daily. (Patient not taking: Reported on 04/12/2020)  . [DISCONTINUED] fluconazole (DIFLUCAN) 150 MG tablet Take one pill on day 1. Take second pill 3 days later if still symptomatic.   No facility-administered medications prior to visit.   Propanolol as needed, not taken in a few months Not taking zoloft anymore (stopped 2 months ago) Phentermine, last pill this morning Woman's multivitamin (2) Collagen 2/day (for hair loss) for a couple of weeks  Review of Systems  Constitutional: Positive for fatigue.  HENT: Negative for hearing loss.   Respiratory: Negative for chest tightness and shortness of breath.   Cardiovascular: Positive for chest pain.  Negative for palpitations and leg swelling.  Neurological: Positive for dizziness, light-headedness and headaches. Negative for syncope and numbness.    Last CBC Lab Results  Component Value Date   WBC 5.1 02/05/2019   HGB 14.4 02/05/2019   HCT 41 02/05/2019   MCV 93.4 05/05/2018   MCH 33.0 05/05/2018   RDW 12.9 05/05/2018   PLT 170 02/05/2019   Last thyroid functions Lab  Results  Component Value Date   TSH 0.65 02/05/2019      Objective    BP 104/72 (BP Location: Left Arm, Patient Position: Sitting, Cuff Size: Normal)   Pulse (!) 153   Temp 98.4 F (36.9 C) (Oral)   Resp 16   Ht 5\' 5"  (1.651 m)   Wt 138 lb 12.8 oz (63 kg)   BMI 23.10 kg/m  Wt Readings from Last 3 Encounters:  04/12/20 138 lb 12.8 oz (63 kg)  03/21/20 139 lb 6.4 oz (63.2 kg)  01/20/20 139 lb (63 kg)    Repeat pulse: 67 Orthostatic VS for the past 72 hrs (Last 3 readings):  Orthostatic BP Patient Position BP Location Cuff Size Orthostatic Pulse  04/12/20 1130 108/78 Standing Left Arm Normal 85  04/12/20 1129 107/75 Sitting Left Arm Normal 87  04/12/20 1128 103/69 Supine Left Arm Normal 69   Physical Exam Constitutional:      General: She is not in acute distress.    Appearance: Normal appearance. She is normal weight. She is not ill-appearing, toxic-appearing or diaphoretic.  HENT:     Right Ear: External ear normal.     Left Ear: External ear normal.  Eyes:     General: No scleral icterus.       Right eye: No discharge.        Left eye: No discharge.     Extraocular Movements: Extraocular movements intact.     Conjunctiva/sclera: Conjunctivae normal.     Pupils: Pupils are equal, round, and reactive to light.  Neck:     Thyroid: No thyroid mass, thyromegaly or thyroid tenderness.  Cardiovascular:     Rate and Rhythm: Normal rate and regular rhythm.     Heart sounds: Normal heart sounds. No murmur heard.   Pulmonary:     Effort: Pulmonary effort is normal. No respiratory distress.      Breath sounds: Normal breath sounds. No wheezing.  Musculoskeletal:     Cervical back: Normal range of motion. No rigidity or tenderness.  Lymphadenopathy:     Cervical: No cervical adenopathy.  Skin:    General: Skin is warm.  Neurological:     General: No focal deficit present.     Mental Status: She is alert and oriented to person, place, and time. Mental status is at baseline.     Sensory: Sensation is intact.     Motor: No weakness.     Coordination: Romberg sign negative. Finger-Nose-Finger Test and Heel to Medina Test normal.     Gait: Gait is intact. Gait and tandem walk normal.     Comments: Normal extraocular movements, PERRL. Normal facial sensation bilaterally, normal and symmetric facial movement bilaterally. Normal hearing bilaterally  Psychiatric:        Mood and Affect: Mood normal.        Behavior: Behavior normal.        Thought Content: Thought content normal.        Judgment: Judgment normal.   EKG: normal sinus rhythm; within normal limits  No results found for any visits on 04/12/20.  Assessment & Plan    1. Episodic lightheadedness  Ms. Hilgert has experienced occasional episodes of lightheadedness in the past and episodes this Thursday and Friday with feeling weird afterwards. Pulse in office was initially 153 and later within normal limits with normal orthostatic BP and normal EKG. The episodic nature and variable pulse rates raises concerns for a cardiac origin, especially an arrhythmia. Given her family history  and hair loss, this may also be related to a thyroid issue. A brain or brainstem issue is less likely given her normal hearing loss and normal neurologic exam.  Plan: - CBC w/Diff/Platelet - TSH - EKG 12-Lead - Ambulatory referral to Cardiology   No follow-ups on file.      Rodrigo Ran, MS3

## 2020-04-13 ENCOUNTER — Telehealth: Payer: Self-pay

## 2020-04-13 LAB — CBC WITH DIFFERENTIAL/PLATELET
Basophils Absolute: 0.1 10*3/uL (ref 0.0–0.2)
Basos: 1 %
EOS (ABSOLUTE): 0.1 10*3/uL (ref 0.0–0.4)
Eos: 1 %
Hematocrit: 41.6 % (ref 34.0–46.6)
Hemoglobin: 14.7 g/dL (ref 11.1–15.9)
Immature Grans (Abs): 0 10*3/uL (ref 0.0–0.1)
Immature Granulocytes: 0 %
Lymphocytes Absolute: 1.4 10*3/uL (ref 0.7–3.1)
Lymphs: 27 %
MCH: 31.7 pg (ref 26.6–33.0)
MCHC: 35.3 g/dL (ref 31.5–35.7)
MCV: 90 fL (ref 79–97)
Monocytes Absolute: 0.4 10*3/uL (ref 0.1–0.9)
Monocytes: 8 %
Neutrophils Absolute: 3.2 10*3/uL (ref 1.4–7.0)
Neutrophils: 63 %
Platelets: 188 10*3/uL (ref 150–450)
RBC: 4.64 x10E6/uL (ref 3.77–5.28)
RDW: 11.7 % (ref 11.7–15.4)
WBC: 5.1 10*3/uL (ref 3.4–10.8)

## 2020-04-13 LAB — TSH: TSH: 1.2 u[IU]/mL (ref 0.450–4.500)

## 2020-04-13 NOTE — Telephone Encounter (Signed)
-----   Message from Virginia Crews, MD sent at 04/13/2020  8:27 AM EDT ----- Normal labs

## 2020-04-13 NOTE — Telephone Encounter (Signed)
Written by Virginia Crews, MD on 04/13/2020 8:27 AM EDT Seen by patient [name] on 04/13/2020 8:46 AM

## 2020-04-19 DIAGNOSIS — N72 Inflammatory disease of cervix uteri: Secondary | ICD-10-CM | POA: Diagnosis not present

## 2020-04-19 DIAGNOSIS — R87610 Atypical squamous cells of undetermined significance on cytologic smear of cervix (ASC-US): Secondary | ICD-10-CM | POA: Diagnosis not present

## 2020-04-19 DIAGNOSIS — B977 Papillomavirus as the cause of diseases classified elsewhere: Secondary | ICD-10-CM | POA: Diagnosis not present

## 2020-04-19 DIAGNOSIS — N898 Other specified noninflammatory disorders of vagina: Secondary | ICD-10-CM | POA: Diagnosis not present

## 2020-04-19 DIAGNOSIS — Z113 Encounter for screening for infections with a predominantly sexual mode of transmission: Secondary | ICD-10-CM | POA: Diagnosis not present

## 2020-04-19 DIAGNOSIS — N871 Moderate cervical dysplasia: Secondary | ICD-10-CM | POA: Diagnosis not present

## 2020-04-19 DIAGNOSIS — R87810 Cervical high risk human papillomavirus (HPV) DNA test positive: Secondary | ICD-10-CM | POA: Diagnosis not present

## 2020-04-19 DIAGNOSIS — N870 Mild cervical dysplasia: Secondary | ICD-10-CM | POA: Diagnosis not present

## 2020-04-25 DIAGNOSIS — N871 Moderate cervical dysplasia: Secondary | ICD-10-CM | POA: Diagnosis not present

## 2020-05-01 ENCOUNTER — Other Ambulatory Visit: Payer: Self-pay | Admitting: Obstetrics and Gynecology

## 2020-05-17 ENCOUNTER — Ambulatory Visit (INDEPENDENT_AMBULATORY_CARE_PROVIDER_SITE_OTHER): Payer: BC Managed Care – PPO

## 2020-05-17 ENCOUNTER — Encounter: Payer: Self-pay | Admitting: Internal Medicine

## 2020-05-17 ENCOUNTER — Ambulatory Visit (INDEPENDENT_AMBULATORY_CARE_PROVIDER_SITE_OTHER): Payer: BC Managed Care – PPO | Admitting: Internal Medicine

## 2020-05-17 ENCOUNTER — Other Ambulatory Visit: Payer: Self-pay

## 2020-05-17 VITALS — BP 109/69 | HR 76 | Ht 65.0 in | Wt 146.0 lb

## 2020-05-17 DIAGNOSIS — R079 Chest pain, unspecified: Secondary | ICD-10-CM | POA: Diagnosis not present

## 2020-05-17 DIAGNOSIS — R002 Palpitations: Secondary | ICD-10-CM

## 2020-05-17 DIAGNOSIS — R42 Dizziness and giddiness: Secondary | ICD-10-CM

## 2020-05-17 NOTE — Progress Notes (Signed)
New Outpatient Visit Date: 05/17/2020  Referring Provider: Virginia Crews, [address] [place],  [place] [address]  Chief Complaint: Lightheadedness  HPI:  Ms. Dinkel is a 39 y.o. female who is being seen today for the evaluation of lightheadedness at the request of Dr. Brita Romp. She has a history of childhood seizure, anal fissures, kidney stones, frequent headaches, anemia, breast mass, and abnormal Pap smear with plans for hysterectomy next month.  Over the last month, Ms. Reach has experienced several episodes of lightheadedness/dizziness.  The first occurred at work while she was walking.  She had to hold onto a coworker for stability and then began to experience tingling in her arms and hands.  It took several hours for things to normalize.  The next day, she still felt "draggy."  She had a second episode while pulling up to the hair salon shortly thereafter.  She again was most bothered by tingling in her arms and hands.  She has also felt some cramping in her hands associated with the tingling.  Ms. Brodbeck denies obvious precipitants including medication changes or different activities to coincide with onset of symptoms.  She consumes quite a bit of caffeine but says this is not new and believes that she is likely tolerant to the effects of caffeine.  Ms. Racette has noticed occasional chest pain, especially when she gets "stressed out."  She describes it as tightness as well as sharp pains that can last for hours at a time.  The last episode was about 2 weeks ago.  There are no associated symptoms.  She denies shortness of breath, edema, and orthopnea.  She has never passed out.  She denies prior cardiac work-up.  --------------------------------------------------------------------------------------------------  Cardiovascular History & Procedures: Cardiovascular Problems:  Palpitations  Chest pain  Risk Factors:  Tobacco use  Cath/PCI:  None  CV  Surgery:  None  EP Procedures and Devices:  None  Non-Invasive Evaluation(s):  None  Recent CV Pertinent Labs: Lab Results  Component Value Date   CHOL 160 02/05/2019   HDL 59 02/05/2019   TRIG 58 02/05/2019   K 3.8 02/05/2019   K 3.3 (L) 07/22/2014   BUN 10 02/05/2019   BUN 8 07/22/2014   CREATININE 0.8 02/05/2019   CREATININE 0.80 04/14/2018   CREATININE 0.83 07/22/2014    --------------------------------------------------------------------------------------------------  Past Medical History:  Diagnosis Date  . Abnormal Pap smear of cervix   . Anemia    after delivery  . Breast mass 2018   bil masses felt by MD  . Frequent headaches   . History of kidney stones    currently as of 04-2018 bil  . Seizure [facility])    age 47 x1    Past Surgical History:  Procedure Laterality Date  . ABLATION    . APPENDECTOMY  2012  . CHOLECYSTECTOMY  2002  . LAPAROSCOPIC LYSIS OF ADHESIONS  05/11/2018   Procedure: LAPAROSCOPIC LYSIS OF ADHESIONS;  Surgeon: Brayton Mars, MD;  Location: [facility] ORS;  Service: Gynecology;;  . LAPAROSCOPY N/A 05/11/2018   Procedure: LAPAROSCOPY DIAGNOSTIC WITH BIOPSIES;  Surgeon: Brayton Mars, MD;  Location: [facility] ORS;  Service: Gynecology;  Laterality: N/A;  . OVARIAN CYST REMOVAL    . TONSILLECTOMY  2010    Current Meds  Medication Sig  . Lidocaine, Anorectal, 5 % GEL Use twice daily prn  . Nitroglycerin 0.4 % OINT Use topically BID  . propranolol (INDERAL) 20 MG tablet Take 20 mg by mouth 2 (  two) times daily as needed.    Allergies: Patient has no known allergies.  Social History   Tobacco Use  . Smoking status: Current Every Day Smoker    Packs/day: 0.50    Years: 18.00    Pack years: 9.00    Types: E-cigarettes    Last attempt to quit: 04/08/2017    Years since quitting: 3.1  . Smokeless tobacco: Never Used  . Tobacco comment: Vapes now.  Vaping Use  . Vaping Use: Every day  . Substances: Nicotine, Flavoring    Substance Use Topics  . Alcohol use: Yes    Alcohol/week: 1.0 standard drink    Types: 1 Cans of beer per week    Comment: occasional  . Drug use: No    Family History  Problem Relation Age of Onset  . Hypertension Mother   . Kidney disease Mother   . Hyperlipidemia Mother   . Thyroid disease Mother        thyroid cancer  . COPD Mother   . Osteoporosis Mother   . Hypertension Father   . Clotting disorder Maternal Grandmother        reports she died of a blood clot  . COPD Maternal Grandfather   . Gout Maternal Grandfather   . Heart failure Maternal Grandfather   . Heart failure Paternal Grandfather   . Hypertension Paternal Grandfather   . COPD Paternal Grandfather   . Breast cancer Neg Hx   . Ovarian cancer Neg Hx   . Colon cancer Neg Hx     Review of Systems: A 12-system review of systems was performed and was negative except as noted in the HPI.  --------------------------------------------------------------------------------------------------  Physical Exam: BP 109/69 (BP Location: Right Arm, Patient Position: Sitting, Cuff Size: Normal)   Pulse 76   Ht 5\' 5"  (1.651 m)   Wt 146 lb (66.2 kg)   SpO2 97%   BMI 24.30 kg/m   General: NAD. HEENT: No conjunctival pallor or scleral icterus. Facemask in place. Neck: Supple without lymphadenopathy, thyromegaly, JVD, or HJR. No carotid bruit. Lungs: Normal work of breathing. Clear to auscultation bilaterally without wheezes or crackles. Heart: Regular rate and rhythm without murmurs, rubs, or gallops. Non-displaced PMI. Abd: Bowel sounds present. Soft, NT/ND without hepatosplenomegaly Ext: No lower extremity edema. Radial, PT, and DP pulses are 2+ bilaterally Skin: Warm and dry without rash. Neuro: CNIII-XII intact. Strength and fine-touch sensation intact in upper and lower extremities bilaterally. Psych: Normal mood and affect.  EKG: Normal sinus rhythm without abnormality.  Lab Results  Component Value Date    WBC 5.1 04/12/2020   HGB 14.7 04/12/2020   HCT 41.6 04/12/2020   MCV 90 04/12/2020   PLT 188 04/12/2020    Lab Results  Component Value Date   NA 140 02/05/2019   K 3.8 02/05/2019   CL 105 02/05/2019   CO2 29 (A) 02/05/2019   BUN 10 02/05/2019   CREATININE 0.8 02/05/2019   GLUCOSE 96 04/14/2018   ALT 11 02/05/2019    Lab Results  Component Value Date   CHOL 160 02/05/2019   HDL 59 02/05/2019   TRIG 58 02/05/2019     --------------------------------------------------------------------------------------------------  ASSESSMENT AND PLAN: Palpitations, lightheadedness, and chest pain: Symptoms seem to come on randomly and have been most pronounced over the last month.  Examination and EKG today are unrevealing.  There certainly could be an element of underlying anxiety contributing to the symptoms.  Recent labs were unrevealing, including normal TSH.  It  should be noted that electrolytes were not checked; it may be worthwhile to obtain a BMP and magnesium level, particularly if ectopy is observed on the event monitor.  I have recommended that we obtain a transthoracic echocardiogram and 14-day event monitor for further evaluation.  If the studies are unremarkable, I think it would be reasonable to defer additional cardiac testing.  In the meantime, I have encouraged Ms. Hottel to minimize her caffeine intake as well as to avoid tobacco/nicotine.  Follow-up: Return to clinic in 6 weeks.  Nelva Bush, MD 05/17/2020 8:55 PM

## 2020-05-17 NOTE — Patient Instructions (Signed)
Medication Instructions:  Your physician recommends that you continue on your current medications as directed. Please refer to the Current Medication list given to you today.  *If you need a refill on your cardiac medications before your next appointment, please call your pharmacy*   Lab Work: none If you have labs (blood work) drawn today and your tests are completely normal, you will receive your results only by: Marland Kitchen MyChart Message (if you have MyChart) OR . A paper copy in the mail If you have any lab test that is abnormal or we need to change your treatment, we will call you to review the results.   Testing/Procedures: Your physician has requested that you have an echocardiogram. Echocardiography is a painless test that uses sound waves to create images of your heart. It provides your doctor with information about the size and shape of your heart and how well your heart's chambers and valves are working. This procedure takes approximately one hour. There are no restrictions for this procedure. You may get an IV, if needed, to receive an ultrasound enhancing agent through to better visualize your heart.    [place] has recommended that you wear a Zio monitor. This monitor is a medical device that records the heart's electrical activity. Doctors most often use these monitors to diagnose arrhythmias. Arrhythmias are problems with the speed or rhythm of the heartbeat. The monitor is a small device applied to your chest. You can wear one while you do your normal daily activities. While wearing this monitor if you have any symptoms to push the button and record what you felt. Once you have worn this monitor for the period of time provider prescribed (Usually 14 days), you will return the monitor device in the postage paid box. Once it is returned they will download the data collected and provide [place] with a report which the provider will then review and we will call you with  those results. Important tips:  1. Avoid showering during the first 24 hours of wearing the monitor. 2. Avoid excessive sweating to help maximize wear time. 3. Do not submerge the device, no hot tubs, and no swimming pools. 4. Keep any lotions or oils away from the patch. 5. After 24 hours you may shower with the patch on. Take brief showers with your back facing the shower head.  6. Do not remove patch once it has been placed because that will interrupt data and decrease adhesive wear time. 7. Push the button when you have any symptoms and write down what you were feeling. 8. Once you have completed wearing your monitor, remove and place into box which has postage paid and place in your outgoing mailbox.  9. If for some reason you have misplaced your box then call our office and we can provide another box and/or mail it off for you.   Follow-Up: At [facility], you and your health needs are our priority.  As part of our continuing mission to provide you with exceptional heart care, we have created designated Provider Care Teams.  These Care Teams include your primary Cardiologist (physician) and Advanced Practice Providers (APPs -  Physician Assistants and Nurse Practitioners) who all work together to provide you with the care you need, when you need it.  We recommend signing up for the patient portal called "MyChart".  Sign up information is provided on this After Visit Summary.  MyChart is used to connect with patients for Virtual Visits (Telemedicine).  Patients are able to view lab/test results, encounter notes, upcoming appointments, etc.  Non-urgent messages can be sent to your provider as well.   To learn more about what you can do with MyChart, go to [email or web address].    Your next appointment:   6 week(s)  The format for your next appointment:   In Person  Provider:   You may see DR Harrell Gave END or one of the following Advanced Practice Providers on your designated  Care Team:    Murray Hodgkins, NP  Christell Faith, PA-C  Marrianne Mood, PA-C  Cadence Kathlen Mody, [place]    Echocardiogram An echocardiogram is a procedure that uses painless sound waves (ultrasound) to produce an image of the heart. Images from an echocardiogram can provide important information about:  Signs of coronary artery disease (CAD).  Aneurysm detection. An aneurysm is a weak or damaged part of an artery wall that bulges out from the normal force of blood pumping through the body.  Heart size and shape. Changes in the size or shape of the heart can be associated with certain conditions, including heart failure, aneurysm, and CAD.  Heart muscle function.  Heart valve function.  Signs of a past heart attack.  Fluid buildup around the heart.  Thickening of the heart muscle.  A tumor or infectious growth around the heart valves. Tell a health care provider about:  Any allergies you have.  All medicines you are taking, including vitamins, herbs, eye drops, creams, and over-the-counter medicines.  Any blood disorders you have.  Any surgeries you have had.  Any medical conditions you have.  Whether you are pregnant or may be pregnant. What are the risks? Generally, this is a safe procedure. However, problems may occur, including:  Allergic reaction to dye (contrast) that may be used during the procedure. What happens before the procedure? No specific preparation is needed. You may eat and drink normally. What happens during the procedure?   An IV tube may be inserted into one of your veins.  You may receive contrast through this tube. A contrast is an injection that improves the quality of the pictures from your heart.  A gel will be applied to your chest.  A wand-like tool (transducer) will be moved over your chest. The gel will help to transmit the sound waves from the transducer.  The sound waves will harmlessly bounce off of your heart to allow the heart  images to be captured in real-time motion. The images will be recorded on a computer. The procedure may vary among health care providers and hospitals. What happens after the procedure?  You may return to your normal, everyday life, including diet, activities, and medicines, unless your health care provider tells you not to do that. Summary  An echocardiogram is a procedure that uses painless sound waves (ultrasound) to produce an image of the heart.  Images from an echocardiogram can provide important information about the size and shape of your heart, heart muscle function, heart valve function, and fluid buildup around your heart.  You do not need to do anything to prepare before this procedure. You may eat and drink normally.  After the echocardiogram is completed, you may return to your normal, everyday life, unless your health care provider tells you not to do that. This information is not intended to replace advice given to you by your health care provider. Make sure you discuss any questions you have with your health care provider. Document Revised: 11/19/2018 Document Reviewed: 08/31/2016 Elsevier  Patient Education  [employer].

## 2020-06-07 ENCOUNTER — Other Ambulatory Visit: Payer: BC Managed Care – PPO

## 2020-06-08 ENCOUNTER — Telehealth: Payer: Self-pay | Admitting: Family Medicine

## 2020-06-08 ENCOUNTER — Other Ambulatory Visit: Payer: Self-pay

## 2020-06-08 ENCOUNTER — Ambulatory Visit (INDEPENDENT_AMBULATORY_CARE_PROVIDER_SITE_OTHER): Payer: BC Managed Care – PPO

## 2020-06-08 DIAGNOSIS — R079 Chest pain, unspecified: Secondary | ICD-10-CM

## 2020-06-08 DIAGNOSIS — R42 Dizziness and giddiness: Secondary | ICD-10-CM | POA: Diagnosis not present

## 2020-06-08 LAB — ECHOCARDIOGRAM COMPLETE
Area-P 1/2: 2.82 cm2
S' Lateral: 3 cm

## 2020-06-08 NOTE — Telephone Encounter (Signed)
Patient is calling because she was referred to cardiology. 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 How should the patient proceed. Please advise CB- [phone]

## 2020-06-09 NOTE — Telephone Encounter (Signed)
LMTCB 06/09/2020.  PEC please advise as below.   Thanks,   -Mickel Baas

## 2020-06-09 NOTE — Telephone Encounter (Signed)
Patient called back and message was read to patient.  Patient would still like the nurse or doctor to call her back  To discuss.  CB# [phone]

## 2020-06-09 NOTE — Telephone Encounter (Signed)
I do not see any imaging or documentation in her chart about nodules found on her thyroid.  Can we figure out who told her this or where this is coming from?

## 2020-06-09 NOTE — Telephone Encounter (Signed)
Patient is calling back regarding concerns about increased nodules on her thyroid. Patient was told by cardiologist referred by Dr. B that she has noodles on her thyroid. They where shown to her on a scan. Patient was advised by cardiology not to say that she was told that she had increased noodles. However the discussion about increased nodles on her thyroid have the patient very concerned since her mother had thyroid cancer. An appt was scheduled with the patient to discuss with Dr.B for an OV on 11/11Cbe/21 Please advise Cb- [phone]

## 2020-06-12 ENCOUNTER — Telehealth: Payer: Self-pay | Admitting: *Deleted

## 2020-06-12 NOTE — Telephone Encounter (Signed)
Results released to My Chart. No DPR for me to leave a message. No answer. Left message that results on MyChart and to call back or message with any further questions.

## 2020-06-12 NOTE — Telephone Encounter (Signed)
Would still like to know how these nodules were detected.  Thyroid does not seem to have been imaged. Guess it could wait for upcoming OV to discuss though.

## 2020-06-12 NOTE — Telephone Encounter (Signed)
-----   Message from Nelva Bush, MD sent at 06/12/2020  9:20 AM EDT ----- Please let Diane Roberts know that her echocardiogram is normal.  We will follow-up with her after completion/review of the event monitor.

## 2020-06-14 ENCOUNTER — Ambulatory Visit: Payer: BC Managed Care – PPO | Admitting: Gastroenterology

## 2020-06-20 ENCOUNTER — Telehealth: Payer: Self-pay | Admitting: *Deleted

## 2020-06-20 NOTE — Telephone Encounter (Signed)
No answer. Left message with patient letting her know I was checking to see if she had the monitor or had mailed it back yet. Also, sent her a MyChart message asking her about the monitor.

## 2020-06-20 NOTE — Telephone Encounter (Signed)
-----   Message from Emily Filbert, RN sent at 06/20/2020 12:13 PM EST ----- FYI- Fax received from [facility] stating the patient's ZIO has not yet been returned.   Serial # [id number]

## 2020-06-22 ENCOUNTER — Encounter: Payer: Self-pay | Admitting: Family Medicine

## 2020-06-22 ENCOUNTER — Telehealth: Payer: Self-pay

## 2020-06-22 ENCOUNTER — Ambulatory Visit: Payer: BC Managed Care – PPO | Admitting: Family Medicine

## 2020-06-22 ENCOUNTER — Other Ambulatory Visit: Payer: Self-pay

## 2020-06-22 VITALS — BP 99/62 | HR 71 | Temp 98.1°F | Wt 151.0 lb

## 2020-06-22 DIAGNOSIS — E041 Nontoxic single thyroid nodule: Secondary | ICD-10-CM | POA: Insufficient documentation

## 2020-06-22 HISTORY — DX: Nontoxic single thyroid nodule: E04.1

## 2020-06-22 NOTE — Telephone Encounter (Signed)
No answer. Left message to call back.   

## 2020-06-22 NOTE — Telephone Encounter (Signed)
Copied from [place] [phone]. Topic: General - Other >> Jun 22, 2020 11:43 AM Celene Kras wrote: Reason for CRM: Pt calling stating that she is returning Sarah's call. Please advise.

## 2020-06-22 NOTE — Assessment & Plan Note (Signed)
Alleged thyroid nodules detected on curbside neck US No symptoms of hypo or hyperthyroidism TSH normal 1.2 on 04/12/2020 Unremarkable physical exam  Plan:  Neck US  Will not recheck TSH at this time

## 2020-06-22 NOTE — Progress Notes (Signed)
Established patient visit   Patient: Diane Roberts   DOB: 1981/02/23   39 y.o. Female  MRN: [id number] Visit Date: 06/22/2020  Today's healthcare provider: Lavon Paganini, MD   Chief Complaint  Patient presents with  . Thyroid Problem   Subjective    HPI   Ercelle presents after incidental discovery of potential thyroid nodules on an ultrasound exam with the cardiologist. She is concerned about them because her mother had graves disease 15 years ago and needed radiation.   She has gained 12 lbs in the last month which she attributes to increased consumption of sweets. She denies cold or heat intolerance, motor retardation, anhedonia or thoughts of sadness or hopelessness. She has noticed some 'easier bruising'. She has not noticed any lumps on her body or swelling in her neck.   Social History   Tobacco Use  . Smoking status: Current Every Day Smoker    Packs/day: 0.50    Years: 18.00    Pack years: 9.00    Types: E-cigarettes    Last attempt to quit: 04/08/2017    Years since quitting: 3.2  . Smokeless tobacco: Never Used  . Tobacco comment: Vapes now.  Vaping Use  . Vaping Use: Every day  . Substances: Nicotine, Flavoring  Substance Use Topics  . Alcohol use: Yes    Alcohol/week: 1.0 standard drink    Types: 1 Cans of beer per week    Comment: occasional  . Drug use: No       Medications: Outpatient Medications Prior to Visit  Medication Sig  . Lidocaine, Anorectal, 5 % GEL Use twice daily prn  . Nitroglycerin 0.4 % OINT Use topically BID  . propranolol (INDERAL) 20 MG tablet Take 20 mg by mouth 2 (two) times daily as needed.   No facility-administered medications prior to visit.    Review of Systems  Constitutional: Negative.   HENT: Negative.   Eyes: Negative.   Respiratory: Negative.   Cardiovascular: Negative.   Gastrointestinal: Negative.   Endocrine: Negative.   Genitourinary: Negative.   Musculoskeletal: Negative.     Allergic/Immunologic: Negative.   Neurological: Negative.   Hematological: Negative.   Psychiatric/Behavioral: Negative.       Objective    BP 99/62 (BP Location: Right Arm, Patient Position: Sitting, Cuff Size: Normal)   Pulse 71   Temp 98.1 F (36.7 C) (Oral)   Wt 151 lb (68.5 kg)   BMI 25.13 kg/m    Physical Exam   General: Well appearing, conversational, in NAD HEENT: no palpanle lymphadenopathy, normal thyroid with no palpable nodules Cards: RRR, no murmurs rubs or gallops Pulm: Lungs CTA bilaterally GI: Non-distended, Non-tender  No results found for any visits on 06/22/20.  Assessment & Plan     Problem List Items Addressed This Visit      Endocrine   Thyroid nodule - Primary    Alleged thyroid nodules detected on curbside neck US No symptoms of hypo or hyperthyroidism TSH normal 1.2 on 04/12/2020 Unremarkable physical exam  Plan:  Neck US  Will not recheck TSH at this time      Relevant Orders   US THYROID       Return if symptoms worsen or fail to improve.     Note was initiated by Stark Klein, MS3  Patient seen along with MS3 student [facility]. I personally evaluated this patient along with the student, and verified all aspects of the history, physical exam, and medical decision making as  documented by the student. I agree with the student's documentation and have made all necessary edits.  Shani Fitch, Dionne Bucy, MD, MPH [facility]

## 2020-06-27 NOTE — Telephone Encounter (Signed)
Checked ZIO Suite and says "in transit to [facility] ETA 06/27/2020." No need to try calling patient again at this time.

## 2020-06-28 NOTE — Progress Notes (Deleted)
{Choose 1 Note Type (Video or Telephone):[phone]}    Date:  06/28/2020   ID:  Diane Roberts, DOB 1980-10-03, MRN [id number] The patient was identified using 2 identifiers.  {Patient Location:([phone]::"Home"} Provider Location: Office/Clinic  PCP:  Virginia Crews, MD  Cardiologist:  Nelva Bush, MD Electrophysiologist:  None   Evaluation Performed:  {Choose Visit Type:[phone]::"Follow-Up Visit"}  Chief Complaint:  ***  History of Present Illness:    Diane Roberts is a 39 y.o. female with childhood seizures, anal fissures, kidney stones, frequent headaches, anemia, breast mass, and abnormal Pap smear with plans for hysterectomy next month.  We are speaking today for follow-up of lightheadedness.  I met Ms. Zappia last month for evaluation of lightheadedness as well as occasional chest pain.  We agreed to obtain a 14-day event monitor and echocardiogram; echocardiogram was normal.  The patient {does/does not:200015} have symptoms concerning for COVID-19 infection (fever, chills, cough, or new shortness of breath).    Past Medical History:  Diagnosis Date  . Abnormal Pap smear of cervix   . Anemia    after delivery  . Breast mass 2018   bil masses felt by MD  . Frequent headaches   . History of kidney stones    currently as of 04-2018 bil  . Seizure [facility])    age 41 x1  . Thyroid nodule 06/22/2020   Past Surgical History:  Procedure Laterality Date  . ABLATION    . APPENDECTOMY  2012  . CHOLECYSTECTOMY  2002  . LAPAROSCOPIC LYSIS OF ADHESIONS  05/11/2018   Procedure: LAPAROSCOPIC LYSIS OF ADHESIONS;  Surgeon: Brayton Mars, MD;  Location: [facility] ORS;  Service: Gynecology;;  . LAPAROSCOPY N/A 05/11/2018   Procedure: LAPAROSCOPY DIAGNOSTIC WITH BIOPSIES;  Surgeon: Brayton Mars, MD;  Location: [facility] ORS;  Service: Gynecology;  Laterality: N/A;  . OVARIAN CYST REMOVAL    . TONSILLECTOMY  2010     No outpatient medications have been marked  as taking for the 06/29/20 encounter (Appointment) with Shaylinn Hladik, Harrell Gave, MD.     Allergies:   Patient has no known allergies.   Social History   Tobacco Use  . Smoking status: Current Every Day Smoker    Packs/day: 0.50    Years: 18.00    Pack years: 9.00    Types: E-cigarettes    Last attempt to quit: 04/08/2017    Years since quitting: 3.2  . Smokeless tobacco: Never Used  . Tobacco comment: Vapes now.  Vaping Use  . Vaping Use: Every day  . Substances: Nicotine, Flavoring  Substance Use Topics  . Alcohol use: Yes    Alcohol/week: 1.0 standard drink    Types: 1 Cans of beer per week    Comment: occasional  . Drug use: No     Family Hx: The patient's family history includes COPD in her maternal grandfather, mother, and paternal grandfather; Clotting disorder in her maternal grandmother; Gout in her maternal grandfather; Heart failure in her maternal grandfather and paternal grandfather; Hyperlipidemia in her mother; Hypertension in her father, mother, and paternal grandfather; Kidney disease in her mother; Osteoporosis in her mother; Thyroid disease in her mother. There is no history of Breast cancer, Ovarian cancer, or Colon cancer.  ROS:   Please see the history of present illness.    *** All other systems reviewed and are negative.   Prior CV studies:   The following studies were reviewed today:  TTE (06/08/2020): Normal LV size and wall thickness.  LVEF 55-60%  with normal wall motion and diastolic function.  Normal RV size and function.  No significant valvular abnormality.  Labs/Other Tests and Data Reviewed:    EKG:  {EKG/Telemetry Strips Reviewed:[phone]}  Recent Labs: 04/12/2020: Hemoglobin 14.7; Platelets 188; TSH 1.200   Recent Lipid Panel Lab Results  Component Value Date/Time   CHOL 160 02/05/2019 12:00 AM   TRIG 58 02/05/2019 12:00 AM   HDL 59 02/05/2019 12:00 AM    Wt Readings from Last 3 Encounters:  06/22/20 151 lb (68.5 kg)  05/17/20 146  lb (66.2 kg)  04/12/20 138 lb 12.8 oz (63 kg)     Risk Assessment/Calculations:   {Does this patient have ATRIAL FIBRILLATION?:[id number]}  Objective:    Vital Signs:  There were no vitals taken for this visit.   {[facility] Virtual Exam (Optional):[phone]::"VITAL SIGNS:  reviewed"}  ASSESSMENT & PLAN:    1. ***  COVID-19 Education: The signs and symptoms of COVID-19 were discussed with the patient and how to seek care for testing (follow up with PCP or arrange E-visit).  ***The importance of social distancing was discussed today.  Time:   Today, I have spent *** minutes with the patient with telehealth technology discussing the above problems.     Medication Adjustments/Labs and Tests Ordered: Current medicines are reviewed at length with the patient today.  Concerns regarding medicines are outlined above.   Tests Ordered: No orders of the defined types were placed in this encounter.   Medication Changes: No orders of the defined types were placed in this encounter.   Follow Up:  {F/U Format:[phone]} {follow up:15908}  Signed, Nelva Bush, MD  06/28/2020 8:13 AM    [facility]

## 2020-06-29 ENCOUNTER — Telehealth: Payer: BC Managed Care – PPO | Admitting: Internal Medicine

## 2020-07-03 ENCOUNTER — Ambulatory Visit: Payer: BC Managed Care – PPO | Attending: Family Medicine

## 2020-07-03 ENCOUNTER — Other Ambulatory Visit: Payer: BC Managed Care – PPO

## 2020-07-03 ENCOUNTER — Telehealth: Payer: Self-pay | Admitting: Family Medicine

## 2020-07-03 NOTE — Telephone Encounter (Deleted)
{  [facility] AMB PEC TELEPHONE NOTE:[id number]}  

## 2020-07-04 ENCOUNTER — Telehealth: Payer: Self-pay

## 2020-07-04 NOTE — Telephone Encounter (Signed)
Copied from [place] [id number]. Topic: Quick Communication - See Telephone Encounter >> Jul 03, 2020  3:50 PM Loma Boston wrote: CRM for notification. See Telephone encounter for: 07/03/20. Kim calling from ultrasound, Pt was a no show today. FYI for Dr B

## 2020-07-06 ENCOUNTER — Other Ambulatory Visit: Payer: BC Managed Care – PPO

## 2020-07-11 NOTE — H&P (Signed)
Diane Roberts is a 39 y.o. 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 Also concerned about excessive cervical mucous .     Past Medical History:  has a past medical history of Benign breast lumps.  Past Surgical History:  has a past surgical history that includes Endometrial ablation w/ novasure (2016); Cholecystectomy; Appendectomy (2013); Tonsillectomy (2010); Cystectomy Ovary Laparoscopic (2012); Diagnostic laparoscopy (2019); and Colposcopy. Family History: family history includes ADD / ADHD in her son; Heart disease in her mother; High blood pressure (Hypertension) in her father, maternal grandfather, and mother; Hyperlipidemia (Elevated cholesterol) in her maternal grandfather and mother; Kidney disease in her maternal grandmother; Kidney failure in her mother; Lumbar disc disease in her paternal grandfather; No Known Problems in her daughter, half-brother, and paternal grandmother; Obesity in her paternal grandfather; Osteoporosis (Thinning of bones) in her mother; Pulmonary embolism in her maternal grandfather and maternal grandmother; Thyroid disease in her mother. Social History:  reports that she quit smoking about 3 years ago. Her smoking use included cigarettes. She smoked 1.00 pack per day. She has never used smokeless tobacco. She reports current alcohol use. She reports that she does not use drugs. OB/GYN History:          OB History    Gravida  2   Para  2   Term      Preterm      AB      Living  2     SAB      TAB      Ectopic      Molar      Multiple      Live Births  2          Allergies: has No Known Allergies. Medications:  Current Outpatient Medications:  .  citalopram (CELEXA) 20 MG tablet, Take 1 tablet (20 mg total) by mouth once daily (Patient not taking: Reported on 04/19/2020  ), Disp: 30 tablet, Rfl: 1 .  Compound Medication, Med Name: Nifedipine 0.3% plus lidocaine 2% cream. Apply to anal area  two times a day and after every bowel movement. (Patient not taking: Reported on 09/08/2019  ), Disp: 30 each, Rfl: 0 .  oxybutynin (DITROPAN-XL) 5 MG XL tablet, Take 1 tablet (5 mg total) by mouth once daily, Disp: 90 tablet, Rfl: 3 .  phentermine (ADIPEX-P) 37.5 MG capsule, Take 37.5 mg by mouth every morning before breakfast, Disp: , Rfl:  .  propranoloL (INDERAL) 20 MG tablet, Take 1 tablet (20 mg total) by mouth 2 (two) times daily as needed, Disp: 60 tablet, Rfl: 1  Review of Systems: General:                      No fatigue or weight loss Eyes:                           No vision changes Ears:                            No hearing difficulty Respiratory:                No cough or shortness of breath Pulmonary:                  No asthma or shortness of breath Cardiovascular:  No chest pain, palpitations, dyspnea on exertion Gastrointestinal:          No abdominal bloating, chronic diarrhea, constipations, masses, pain or hematochezia Genitourinary:             No hematuria, dysuria, abnormal vaginal discharge, pelvic pain, Menometrorrhagia Lymphatic:                   No swollen lymph nodes Musculoskeletal:         No muscle weakness Neurologic:                  No extremity weakness, syncope, seizure disorder Psychiatric:                  No history of depression, delusions or suicidal/homicidal ideation    Exam:      Vitals:   04/25/20 1525  BP: 111/73  Pulse: 90    Body mass index is 23.46 kg/m.  WDWN white/ female in NAD   Lungs: CTA  CV : RRR without murmur   Breast: exam done in sitting and lying position : No dimpling or retraction, no dominant mass, no spontaneous discharge, no axillary adenopathy Neck:  no thyromegaly Abdomen: soft , no mass, normal active bowel sounds,  non-tender, no rebound tenderness Pelvic: tanner stage 5 ,  External genitalia: vulva /labia no lesions Urethra: no prolapse Vagina: normal physiologic d/c, adequate room for  vaginal hysterectomy if need be Cervix: no lesions, no cervical motion tenderness   Uterus: normal size shape and contour, non-tender Adnexa: no mass,  non-tender   Rectovaginal:  Impression:   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    Plan:   Spoke to the pt regarding role of cervical LEEP vs definitive surgery with TVH and bilateral salpingectomy . She elects for the former . Risks have been discussed with patient         Caroline Sauger, MD

## 2020-07-12 ENCOUNTER — Encounter: Payer: Self-pay | Admitting: Family Medicine

## 2020-07-17 ENCOUNTER — Encounter
Admission: RE | Admit: 2020-07-17 | Discharge: 2020-07-17 | Disposition: A | Discharge status: Home / Self Care | Payer: BC Managed Care – PPO | Source: Ambulatory Visit | Attending: Obstetrics and Gynecology | Admitting: Obstetrics and Gynecology

## 2020-07-17 NOTE — Patient Instructions (Addendum)
Your procedure is scheduled on: 07/24/20- Monday Report to the Registration Desk on the 1st floor of the [facility]. To find out your arrival time, please call [phone] between 1PM - 3PM on: 07/21/20- Tuesday  REMEMBER: Instructions that are not followed completely may result in serious medical risk, up to and including death; or upon the discretion of your surgeon and anesthesiologist your surgery may need to be rescheduled.  Do not eat food after midnight the night before surgery.  No gum chewing, lozengers or hard candies.  You may however, drink CLEAR liquids up to 2 hours before you are scheduled to arrive for your surgery. Do not drink anything within 2 hours of your scheduled arrival time.  Clear liquids include: - water  - apple juice without pulp - gatorade (not RED, PURPLE, OR BLUE) - black coffee or tea (Do NOT add milk or creamers to the coffee or tea) Do NOT drink anything that is not on this list.  Type 1 and Type 2 diabetics should only drink water.  In addition, your doctor has ordered for you to drink the provided  Ensure Pre-Surgery Clear Carbohydrate Drink  Drinking this carbohydrate drink up to two hours before surgery helps to reduce insulin resistance and improve patient outcomes. Please complete drinking 2 hours prior to scheduled arrival time.  TAKE THESE MEDICATIONS THE MORNING OF SURGERY WITH A SIP OF WATER: - propranolol (INDERAL) 20 MG tablet if needed.  Stop Anti-inflammatories (NSAIDS)  1 week prior to surgery such as Advil, Aleve, Ibuprofen, Motrin, Naproxen, Naprosyn and Aspirin based products such as Excedrin, Goodys Powder, BC Powder.  Stop ANY OVER THE COUNTER supplements until after surgery. (However, you may continue taking Vitamin D, Vitamin B, and multivitamin up until the day before surgery.)  No Alcohol for 24 hours before or after surgery.  No Smoking including e-cigarettes for 24 hours prior to surgery.  No chewable tobacco  products for at least 6 hours prior to surgery.  No nicotine patches on the day of surgery.  Do not use any "recreational" drugs for at least a week prior to your surgery.  Please be advised that the combination of cocaine and anesthesia may have negative outcomes, up to and including death. If you test positive for cocaine, your surgery will be cancelled.  On the morning of surgery brush your teeth with toothpaste and water, you may rinse your mouth with mouthwash if you wish. Do not swallow any toothpaste or mouthwash.  Do not wear jewelry, make-up, hairpins, clips or nail polish.  Do not wear lotions, powders, or perfumes.   Do not shave body from the neck down 48 hours prior to surgery just in case you cut yourself which could leave a site for infection.  Also, freshly shaved skin may become irritated if using the CHG soap.  Contact lenses, hearing aids and dentures may not be worn into surgery.  Do not bring valuables to the hospital. [facility] is not responsible for any missing/lost belongings or valuables.   Notify your doctor if there is any change in your medical condition (cold, fever, infection).  Wear comfortable clothing (specific to your surgery type) to the hospital.  Plan for stool softeners for home use; pain medications have a tendency to cause constipation. You can also help prevent constipation by eating foods high in fiber such as fruits and vegetables and drinking plenty of fluids as your diet allows.  After surgery, you can help prevent lung complications by doing  breathing exercises.  Take deep breaths and cough every 1-2 hours. Your doctor may order a device called an Incentive Spirometer to help you take deep breaths. When coughing or sneezing, hold a pillow firmly against your incision with both hands. This is called "splinting." Doing this helps protect your incision. It also decreases belly discomfort.  If you are being admitted to the hospital  overnight, leave your suitcase in the car. After surgery it may be brought to your room.  If you are being discharged the day of surgery, you will not be allowed to drive home. You will need a responsible adult (18 years or older) to drive you home and stay with you that night.   If you are taking public transportation, you will need to have a responsible adult (18 years or older) with you. Please confirm with your physician that it is acceptable to use public transportation.   Please call the [facility] at [phone] if you have any questions about these instructions.  Visitation Policy:  Patients undergoing a surgery or procedure may have one family member or support person with them as long as that person is not COVID-19 positive or experiencing its symptoms.  That person may remain in the waiting area during the procedure.  Inpatient Visitation Update:   In an effort to ensure the safety of our team members and our patients, we are implementing a change to our visitation policy:  Effective Monday, Aug. 9, at 7 a.m., inpatients will be allowed one support person.  o The support person may change daily.  o The support person must pass our screening, gel in and out, and wear a mask at all times, including in the patient's room.  o Patients must also wear a mask when staff or their support person are in the room.  o Masking is required regardless of vaccination status.  Systemwide, no visitors 17 or younger.

## 2020-07-20 ENCOUNTER — Other Ambulatory Visit: Payer: Self-pay

## 2020-07-20 ENCOUNTER — Other Ambulatory Visit
Admission: RE | Admit: 2020-07-20 | Discharge: 2020-07-20 | Disposition: A | Discharge status: Home / Self Care | Payer: BC Managed Care – PPO | Source: Ambulatory Visit | Attending: Obstetrics and Gynecology | Admitting: Obstetrics and Gynecology

## 2020-07-20 DIAGNOSIS — Z20822 Contact with and (suspected) exposure to covid-19: Secondary | ICD-10-CM | POA: Diagnosis not present

## 2020-07-20 DIAGNOSIS — Z01812 Encounter for preprocedural laboratory examination: Secondary | ICD-10-CM | POA: Insufficient documentation

## 2020-07-20 LAB — BASIC METABOLIC PANEL
Anion gap: 9 (ref 5–15)
BUN: 10 mg/dL (ref 6–20)
CO2: 25 mmol/L (ref 22–32)
Calcium: 9 mg/dL (ref 8.9–10.3)
Chloride: 105 mmol/L (ref 98–111)
Creatinine, Ser: 0.62 mg/dL (ref 0.44–1.00)
GFR, Estimated: >60 mL/min (ref >60)
Glucose, Bld: 106 mg/dL — ABNORMAL HIGH (ref 70–99)
Potassium: 3.9 mmol/L (ref 3.5–5.1)
Sodium: 139 mmol/L (ref 135–145)

## 2020-07-20 LAB — CBC
HCT: 40.7 % (ref 36.0–46.0)
Hemoglobin: 13.9 g/dL (ref 12.0–15.0)
MCH: 31.5 pg (ref 26.0–34.0)
MCHC: 34.2 g/dL (ref 30.0–36.0)
MCV: 92.3 fL (ref 80.0–100.0)
Platelets: 152 10*3/uL (ref 150–400)
RBC: 4.41 MIL/uL (ref 3.87–5.11)
RDW: 11.5 % (ref 11.5–15.5)
WBC: 9.7 10*3/uL (ref 4.0–10.5)
nRBC: 0 % (ref 0.0–0.2)

## 2020-07-20 LAB — SARS CORONAVIRUS 2 (TAT 6-24 HRS): SARS Coronavirus 2: NEGATIVE

## 2020-07-22 LAB — TYPE AND SCREEN
ABO/RH(D): A POS
Antibody Screen: NEGATIVE

## 2020-07-24 ENCOUNTER — Encounter
Admission: RE | Disposition: A | Discharge status: Home / Self Care | Payer: Self-pay | Source: Home / Self Care | Attending: Obstetrics and Gynecology

## 2020-07-24 ENCOUNTER — Encounter: Payer: Self-pay | Admitting: Obstetrics and Gynecology

## 2020-07-24 ENCOUNTER — Other Ambulatory Visit: Payer: Self-pay

## 2020-07-24 ENCOUNTER — Ambulatory Visit
Admission: RE | Admit: 2020-07-24 | Discharge: 2020-07-24 | Disposition: A | Discharge status: Home / Self Care | Payer: BC Managed Care – PPO | Attending: Obstetrics and Gynecology | Admitting: Obstetrics and Gynecology

## 2020-07-24 ENCOUNTER — Ambulatory Visit: Payer: BC Managed Care – PPO | Admitting: Certified Registered Nurse Anesthetist

## 2020-07-24 DIAGNOSIS — Z79899 Other long term (current) drug therapy: Secondary | ICD-10-CM | POA: Diagnosis not present

## 2020-07-24 DIAGNOSIS — R569 Unspecified convulsions: Secondary | ICD-10-CM | POA: Diagnosis not present

## 2020-07-24 DIAGNOSIS — D649 Anemia, unspecified: Secondary | ICD-10-CM | POA: Diagnosis not present

## 2020-07-24 DIAGNOSIS — Z87891 Personal history of nicotine dependence: Secondary | ICD-10-CM | POA: Insufficient documentation

## 2020-07-24 DIAGNOSIS — N871 Moderate cervical dysplasia: Secondary | ICD-10-CM | POA: Insufficient documentation

## 2020-07-24 DIAGNOSIS — N879 Dysplasia of cervix uteri, unspecified: Secondary | ICD-10-CM | POA: Diagnosis not present

## 2020-07-24 HISTORY — PX: BILATERAL SALPINGECTOMY: SHX5743

## 2020-07-24 HISTORY — PX: VAGINAL HYSTERECTOMY: SHX2639

## 2020-07-24 LAB — POCT PREGNANCY, URINE: Preg Test, Ur: NEGATIVE

## 2020-07-24 SURGERY — HYSTERECTOMY, VAGINAL
Anesthesia: General

## 2020-07-24 MED ORDER — METOCLOPRAMIDE HCL 5 MG/ML IJ SOLN
INTRAMUSCULAR | Status: AC
  Filled 2020-07-24: qty 2

## 2020-07-24 MED ORDER — MIDAZOLAM HCL 2 MG/2ML IJ SOLN
INTRAMUSCULAR | Status: DC | PRN
  Administered 2020-07-24: 2 mg via INTRAVENOUS

## 2020-07-24 MED ORDER — DEXAMETHASONE SODIUM PHOSPHATE 10 MG/ML IJ SOLN
INTRAMUSCULAR | Status: AC
  Filled 2020-07-24: qty 1

## 2020-07-24 MED ORDER — CHLORHEXIDINE GLUCONATE 0.12 % MT SOLN
OROMUCOSAL | Status: AC
  Administered 2020-07-24: 06:00:00 15 mL via OROMUCOSAL
  Filled 2020-07-24: qty 15

## 2020-07-24 MED ORDER — FAMOTIDINE 20 MG PO TABS
ORAL_TABLET | ORAL | Status: AC
  Administered 2020-07-24: 06:00:00 20 mg via ORAL
  Filled 2020-07-24: qty 1

## 2020-07-24 MED ORDER — PROMETHAZINE HCL 25 MG/ML IJ SOLN: 6.2500 mg | Freq: Once | INTRAMUSCULAR | Status: DC

## 2020-07-24 MED ORDER — ROCURONIUM BROMIDE 100 MG/10ML IV SOLN
INTRAVENOUS | Status: DC | PRN
  Administered 2020-07-24: 50 mg via INTRAVENOUS
  Administered 2020-07-24: 10 mg via INTRAVENOUS

## 2020-07-24 MED ORDER — OXYCODONE HCL 5 MG PO TABS
ORAL_TABLET | ORAL | Status: AC
  Filled 2020-07-24: qty 1

## 2020-07-24 MED ORDER — ORAL CARE MOUTH RINSE: 15.0000 mL | Freq: Once | OROMUCOSAL | Status: AC

## 2020-07-24 MED ORDER — ROCURONIUM BROMIDE 10 MG/ML (PF) SYRINGE
PREFILLED_SYRINGE | INTRAVENOUS | Status: AC
  Filled 2020-07-24: qty 10

## 2020-07-24 MED ORDER — LACTATED RINGERS IV SOLN: INTRAVENOUS | Status: DC

## 2020-07-24 MED ORDER — FAMOTIDINE 20 MG PO TABS: 20.0000 mg | ORAL_TABLET | Freq: Once | ORAL | Status: AC

## 2020-07-24 MED ORDER — ONDANSETRON HCL 4 MG/2ML IJ SOLN
INTRAMUSCULAR | Status: DC | PRN
  Administered 2020-07-24: 4 mg via INTRAVENOUS

## 2020-07-24 MED ORDER — OXYCODONE HCL 5 MG/5ML PO SOLN
5.0000 mg | Freq: Once | ORAL | Status: AC | PRN
Start: 2020-07-24 — End: 2020-07-24

## 2020-07-24 MED ORDER — LIDOCAINE HCL (PF) 2 % IJ SOLN
INTRAMUSCULAR | Status: AC
  Filled 2020-07-24: qty 5

## 2020-07-24 MED ORDER — GLYCOPYRROLATE 0.2 MG/ML IJ SOLN
INTRAMUSCULAR | Status: AC
  Filled 2020-07-24: qty 1

## 2020-07-24 MED ORDER — PHENYLEPHRINE HCL (PRESSORS) 10 MG/ML IV SOLN
INTRAVENOUS | Status: DC | PRN
  Administered 2020-07-24: 50 ug via INTRAVENOUS
  Administered 2020-07-24: 100 ug via INTRAVENOUS
  Administered 2020-07-24 (×2): 50 ug via INTRAVENOUS

## 2020-07-24 MED ORDER — PROMETHAZINE HCL 25 MG/ML IJ SOLN
INTRAMUSCULAR | Status: AC
  Administered 2020-07-24: 10:00:00 6.25 mg via INTRAVENOUS
  Filled 2020-07-24: qty 1

## 2020-07-24 MED ORDER — DEXAMETHASONE SODIUM PHOSPHATE 10 MG/ML IJ SOLN
INTRAMUSCULAR | Status: DC | PRN
  Administered 2020-07-24: 10 mg via INTRAVENOUS

## 2020-07-24 MED ORDER — MIDAZOLAM HCL 2 MG/2ML IJ SOLN
INTRAMUSCULAR | Status: AC
  Filled 2020-07-24: qty 2

## 2020-07-24 MED ORDER — OXYCODONE-ACETAMINOPHEN 5-325 MG PO TABS: 1.0000 | ORAL_TABLET | ORAL | Status: DC | PRN

## 2020-07-24 MED ORDER — CHLORHEXIDINE GLUCONATE 0.12 % MT SOLN: 15.0000 mL | Freq: Once | OROMUCOSAL | Status: AC

## 2020-07-24 MED ORDER — LIDOCAINE-EPINEPHRINE 1 %-1:100000 IJ SOLN
INTRAMUSCULAR | Status: DC | PRN
  Administered 2020-07-24: 9 mL

## 2020-07-24 MED ORDER — PROPOFOL 10 MG/ML IV BOLUS
INTRAVENOUS | Status: DC | PRN
  Administered 2020-07-24: 150 mg via INTRAVENOUS

## 2020-07-24 MED ORDER — FENTANYL CITRATE (PF) 100 MCG/2ML IJ SOLN
INTRAMUSCULAR | Status: DC | PRN
  Administered 2020-07-24 (×2): 50 ug via INTRAVENOUS

## 2020-07-24 MED ORDER — SODIUM CHLORIDE FLUSH 0.9 % IV SOLN
INTRAVENOUS | Status: AC
  Filled 2020-07-24: qty 10

## 2020-07-24 MED ORDER — SUGAMMADEX SODIUM 200 MG/2ML IV SOLN
INTRAVENOUS | Status: DC | PRN
  Administered 2020-07-24: 200 mg via INTRAVENOUS

## 2020-07-24 MED ORDER — OXYCODONE HCL 5 MG PO TABS
5.0000 mg | ORAL_TABLET | Freq: Once | ORAL | Status: AC | PRN
  Administered 2020-07-24: 5 mg via ORAL

## 2020-07-24 MED ORDER — LIDOCAINE HCL (CARDIAC) PF 100 MG/5ML IV SOSY
PREFILLED_SYRINGE | INTRAVENOUS | Status: DC | PRN
  Administered 2020-07-24: 100 mg via INTRAVENOUS

## 2020-07-24 MED ORDER — ACETAMINOPHEN 500 MG PO TABS: 1000.0000 mg | ORAL_TABLET | Freq: Once | ORAL | Status: AC

## 2020-07-24 MED ORDER — ACETAMINOPHEN 500 MG PO TABS
ORAL_TABLET | ORAL | Status: AC
  Administered 2020-07-24: 06:00:00 1000 mg via ORAL
  Filled 2020-07-24: qty 2

## 2020-07-24 MED ORDER — ONDANSETRON HCL 4 MG/2ML IJ SOLN
INTRAMUSCULAR | Status: AC
  Filled 2020-07-24: qty 2

## 2020-07-24 MED ORDER — METOCLOPRAMIDE HCL 5 MG/ML IJ SOLN
10.0000 mg | Freq: Once | INTRAMUSCULAR | Status: AC
  Administered 2020-07-24: 14:00:00 10 mg via INTRAVENOUS

## 2020-07-24 MED ORDER — KETOROLAC TROMETHAMINE 30 MG/ML IJ SOLN
INTRAMUSCULAR | Status: DC | PRN
  Administered 2020-07-24: 30 mg via INTRAVENOUS

## 2020-07-24 MED ORDER — POVIDONE-IODINE 10 % EX SWAB
2.0000 "application " | Freq: Once | CUTANEOUS | Status: AC
  Administered 2020-07-24: 2 via TOPICAL

## 2020-07-24 MED ORDER — LIDOCAINE-EPINEPHRINE 1 %-1:100000 IJ SOLN
INTRAMUSCULAR | Status: AC
  Filled 2020-07-24: qty 1

## 2020-07-24 MED ORDER — PROMETHAZINE HCL 25 MG/ML IJ SOLN: 6.2500 mg | Freq: Once | INTRAMUSCULAR | Status: AC

## 2020-07-24 MED ORDER — CEFAZOLIN SODIUM-DEXTROSE 2-4 GM/100ML-% IV SOLN
2.0000 g | Freq: Once | INTRAVENOUS | Status: AC
  Administered 2020-07-24: 08:00:00 2 g via INTRAVENOUS

## 2020-07-24 MED ORDER — PHENYLEPHRINE HCL (PRESSORS) 10 MG/ML IV SOLN
INTRAVENOUS | Status: AC
  Filled 2020-07-24: qty 1

## 2020-07-24 MED ORDER — KETOROLAC TROMETHAMINE 30 MG/ML IJ SOLN
INTRAMUSCULAR | Status: AC
  Filled 2020-07-24: qty 1

## 2020-07-24 MED ORDER — GABAPENTIN 600 MG PO TABS
300.0000 mg | ORAL_TABLET | Freq: Once | ORAL | Status: AC
  Filled 2020-07-24: qty 0.5

## 2020-07-24 MED ORDER — PROPOFOL 10 MG/ML IV BOLUS
INTRAVENOUS | Status: AC
  Filled 2020-07-24: qty 20

## 2020-07-24 MED ORDER — FENTANYL CITRATE (PF) 100 MCG/2ML IJ SOLN
INTRAMUSCULAR | Status: AC
  Filled 2020-07-24: qty 2

## 2020-07-24 MED ORDER — FENTANYL CITRATE (PF) 100 MCG/2ML IJ SOLN
25.0000 ug | INTRAMUSCULAR | Status: DC | PRN
Start: 2020-07-24 — End: 2020-07-24
  Administered 2020-07-24 (×2): 25 ug via INTRAVENOUS

## 2020-07-24 MED ORDER — CEFAZOLIN SODIUM-DEXTROSE 2-4 GM/100ML-% IV SOLN
INTRAVENOUS | Status: AC
  Filled 2020-07-24: qty 100

## 2020-07-24 MED ORDER — GABAPENTIN 300 MG PO CAPS
ORAL_CAPSULE | ORAL | Status: AC
  Administered 2020-07-24: 06:00:00 300 mg
  Filled 2020-07-24: qty 1

## 2020-07-24 MED ORDER — ONDANSETRON 4 MG PO TBDP: 4.0000 mg | ORAL_TABLET | Freq: Four times a day (QID) | ORAL | Status: DC | PRN

## 2020-07-24 SURGICAL SUPPLY — 41 items
BAG DRN RND TRDRP ANRFLXCHMBR (UROLOGICAL SUPPLIES) ×2
BAG URINE DRAIN 2000ML AR STRL (UROLOGICAL SUPPLIES) ×4 IMPLANT
CANISTER SUCT 1200ML W/VALVE (MISCELLANEOUS) ×4 IMPLANT
CATH FOLEY 2WAY  5CC 16FR (CATHETERS) ×2
CATH FOLEY 2WAY 5CC 16FR (CATHETERS) ×2
CATH ROBINSON RED A/P 16FR (CATHETERS) ×4 IMPLANT
CATH URTH 16FR FL 2W BLN LF (CATHETERS) ×2 IMPLANT
COUNTER NEEDLE 20/40 LG (NEEDLE) ×4 IMPLANT
COVER WAND RF STERILE (DRAPES) ×4 IMPLANT
DRAPE PERI LITHO V/GYN (MISCELLANEOUS) ×4 IMPLANT
DRAPE SURG 17X11 SM STRL (DRAPES) ×4 IMPLANT
DRAPE UNDER BUTTOCK W/FLU (DRAPES) ×4 IMPLANT
ELECT REM PT RETURN 9FT ADLT (ELECTROSURGICAL) ×4
ELECTRODE REM PT RTRN 9FT ADLT (ELECTROSURGICAL) ×2 IMPLANT
GAUZE 4X4 16PLY RFD (DISPOSABLE) ×4 IMPLANT
GLOVE SURG SYN 8.0 (GLOVE) ×4 IMPLANT
GOWN STRL REUS W/ TWL LRG LVL3 (GOWN DISPOSABLE) ×6 IMPLANT
GOWN STRL REUS W/ TWL XL LVL3 (GOWN DISPOSABLE) ×2 IMPLANT
GOWN STRL REUS W/TWL LRG LVL3 (GOWN DISPOSABLE) ×12
GOWN STRL REUS W/TWL XL LVL3 (GOWN DISPOSABLE) ×4
KIT TURNOVER CYSTO (KITS) ×4 IMPLANT
LABEL OR SOLS (LABEL) ×4 IMPLANT
MANIFOLD NEPTUNE II (INSTRUMENTS) ×4 IMPLANT
NEEDLE HYPO 22GX1.5 SAFETY (NEEDLE) ×4 IMPLANT
PACK BASIN MINOR (MISCELLANEOUS) ×4 IMPLANT
PAD OB MATERNITY 4.3X12.25 (PERSONAL CARE ITEMS) ×4 IMPLANT
PAD PREP 24X41 OB/GYN DISP (PERSONAL CARE ITEMS) ×4 IMPLANT
SOL PREP PROV IODINE SCRUB 4OZ (MISCELLANEOUS) ×4 IMPLANT
SOL PREP PVP 2OZ (MISCELLANEOUS) ×4
SOLUTION PREP PVP 2OZ (MISCELLANEOUS) ×2 IMPLANT
SURGILUBE 2OZ TUBE FLIPTOP (MISCELLANEOUS) ×4 IMPLANT
SUT PDS 2-0 27IN (SUTURE) ×4 IMPLANT
SUT VIC AB 0 CT1 27 (SUTURE) ×12
SUT VIC AB 0 CT1 27XCR 8 STRN (SUTURE) ×6 IMPLANT
SUT VIC AB 0 CT1 36 (SUTURE) ×4 IMPLANT
SUT VIC AB 2-0 SH 27 (SUTURE) ×4
SUT VIC AB 2-0 SH 27XBRD (SUTURE) ×2 IMPLANT
SYR 10ML LL (SYRINGE) ×4 IMPLANT
SYR CONTROL 10ML LL (SYRINGE) ×4 IMPLANT
TOWEL OR 17X26 4PK STRL BLUE (TOWEL DISPOSABLE) ×4 IMPLANT
WATER STERILE IRR 1000ML POUR (IV SOLUTION) ×4 IMPLANT

## 2020-07-24 NOTE — Discharge Instructions (Addendum)
AMBULATORY SURGERY  DISCHARGE INSTRUCTIONS   1) The drugs that you were given will stay in your system until tomorrow so for the next 24 hours you should not:  A) Drive an automobile B) Make any legal decisions C) Drink any alcoholic beverage   2) You may resume regular meals tomorrow.  Today it is better to start with liquids and gradually work up to solid foods.  You may eat anything you prefer, but it is better to start with liquids, then soup and crackers, and gradually work up to solid foods.   3) Please notify your doctor immediately if you have any unusual bleeding, trouble breathing, redness and pain at the surgery site, drainage, fever, or pain not relieved by medication.    4) Additional Instructions: (Pt/family instructed time oxycodone given postop was 3:53 pm)       Please contact your physician with any problems or Same Day Surgery at [phone], Monday through Friday 6 am to 4 pm, or [place] at [facility] number at [phone].

## 2020-07-24 NOTE — Transfer of Care (Signed)
Immediate Anesthesia Transfer of Care Note  Patient: Diane Roberts  Procedure(s) Performed: HYSTERECTOMY VAGINAL (N/A ) BILATERAL SALPINGECTOMY (Bilateral )  Patient Location: PACU  Anesthesia Type:General  Level of Consciousness: drowsy  Airway & Oxygen Therapy: Patient Spontanous Breathing and Patient connected to face mask oxygen  Post-op Assessment: Report given to RN and Post -op Vital signs reviewed and stable  Post vital signs: Reviewed and stable  Last Vitals:  Vitals Value Taken Time  BP 102/69 07/24/20 0929  Temp    Pulse 71 07/24/20 0932  Resp 21 07/24/20 0932  SpO2 100 % 07/24/20 0932  Vitals shown include unvalidated device data.  Last Pain:  Vitals:   07/24/20 0609  TempSrc: Oral  PainSc: 0-No pain         Complications: No complications documented.

## 2020-07-24 NOTE — Anesthesia Postprocedure Evaluation (Signed)
Anesthesia Post Note  Patient: Diane Roberts  Procedure(s) Performed: HYSTERECTOMY VAGINAL (N/A ) BILATERAL SALPINGECTOMY (Bilateral )  Patient location during evaluation: PACU Anesthesia Type: General Level of consciousness: awake and alert Pain management: pain level controlled Vital Signs Assessment: post-procedure vital signs reviewed and stable Respiratory status: spontaneous breathing, nonlabored ventilation, respiratory function stable and patient connected to nasal cannula oxygen Cardiovascular status: blood pressure returned to baseline and stable Postop Assessment: no apparent nausea or vomiting Anesthetic complications: no   No complications documented.   Last Vitals:  Vitals:   07/24/20 1024 07/24/20 1033  BP:  (!) 102/58  Pulse: 68 65  Resp: 15 16  Temp: (!) 36.1 C 37.1 C  SpO2: 99% 98%    Last Pain:  Vitals:   07/24/20 1033  TempSrc: Temporal  PainSc: [place]

## 2020-07-24 NOTE — OR Nursing (Signed)
Discharge pending patient void. 

## 2020-07-24 NOTE — Anesthesia Preprocedure Evaluation (Signed)
Anesthesia Evaluation  Patient identified by MRN, date of birth, ID band Patient awake    Reviewed: Allergy & Precautions, H&P , NPO status , Patient's Chart, lab work & pertinent test results  History of Anesthesia Complications Negative for: history of anesthetic complications  Airway Mallampati: II  TM Distance: >3 FB Neck ROM: full    Dental  (+) Chipped, Poor Dentition   Pulmonary neg shortness of breath, Current Smoker and Patient abstained from smoking.,    Pulmonary exam normal        Cardiovascular (-) angina(-) Past MI and (-) DOE negative cardio ROS Normal cardiovascular exam     Neuro/Psych  Headaches, Seizures -,  PSYCHIATRIC DISORDERS    GI/Hepatic negative GI ROS, Neg liver ROS, neg GERD  ,  Endo/Other  negative endocrine ROS  Renal/GU      Musculoskeletal   Abdominal   Peds  Hematology negative hematology ROS (+)   Anesthesia Other Findings Past Medical History: No date: Abnormal Pap smear of cervix No date: Anemia     Comment:  after delivery 2018: Breast mass     Comment:  bil masses felt by MD No date: Frequent headaches No date: History of kidney stones     Comment:  currently as of 04-2018 bil No date: Seizure [facility])     Comment:  age 15 x1 06/22/2020: Thyroid nodule  Past Surgical History: No date: ABLATION 2012: APPENDECTOMY 2002: CHOLECYSTECTOMY 05/11/2018: LAPAROSCOPIC LYSIS OF ADHESIONS     Comment:  Procedure: LAPAROSCOPIC LYSIS OF ADHESIONS;  Surgeon:               Brayton Mars, MD;  Location: [facility] ORS;  Service:              Gynecology;; 05/11/2018: LAPAROSCOPY; N/A     Comment:  Procedure: LAPAROSCOPY DIAGNOSTIC WITH BIOPSIES;                Surgeon: Brayton Mars, MD;  Location: [facility] ORS;               Service: Gynecology;  Laterality: N/A; No date: OVARIAN CYST REMOVAL 2010: TONSILLECTOMY     Reproductive/Obstetrics negative OB ROS                              Anesthesia Physical Anesthesia Plan  ASA: III  Anesthesia Plan: General ETT   Post-op Pain Management:    Induction: Intravenous  PONV Risk Score and Plan: Ondansetron, Dexamethasone, Midazolam and Treatment may vary due to age or medical condition  Airway Management Planned: Oral ETT  Additional Equipment:   Intra-op Plan:   Post-operative Plan: Extubation in OR  Informed Consent: I have reviewed the patients History and Physical, chart, labs and discussed the procedure including the risks, benefits and alternatives for the proposed anesthesia with the patient or authorized representative who has indicated his/her understanding and acceptance.     Dental Advisory Given  Plan Discussed with: Anesthesiologist, CRNA and Surgeon  Anesthesia Plan Comments: (Patient consented for risks of anesthesia including but not limited to:  - adverse reactions to medications - damage to eyes, teeth, lips or other oral mucosa - nerve damage due to positioning  - sore throat or hoarseness - Damage to heart, brain, nerves, lungs, other parts of body or loss of life  Patient voiced understanding.)        Anesthesia Quick Evaluation

## 2020-07-24 NOTE — Brief Op Note (Signed)
07/24/2020  9:12 AM  PATIENT:  Diane Roberts  39 y.o. female  PRE-OPERATIVE DIAGNOSIS:  cervical dysplasia  POST-OPERATIVE DIAGNOSIS:  cervical dysplasia  PROCEDURE:  Procedure(s): HYSTERECTOMY VAGINAL (N/A) BILATERAL SALPINGECTOMY (Bilateral)  SURGEON:  Surgeon(s) and Role:    * Wylie Coon, Gwen Her, MD - Primary    * Ward, Honor Loh, MD - Assisting  PHYSICIAN ASSISTANT:   ASSISTANTS:cst  ANESTHESIA:   general  EBL:  ebl 25 cc IOF 700 cc  ou 50 cc  BLOOD ADMINISTERED:none  DRAINS: none   LOCAL MEDICATIONS USED:  LIDOCAINE   SPECIMEN:  Source of Specimen:  cervix , uterus and bilateral fallopian tubes  DISPOSITION OF SPECIMEN:  PATHOLOGY  COUNTS:  YES  TOURNIQUET:  * No tourniquets in log *  DICTATION: .Other Dictation: Dictation Number verbal  PLAN OF CARE: Discharge to home after PACU  PATIENT DISPOSITION:  PACU - hemodynamically stable.   Delay start of Pharmacological VTE agent (>24hrs) due to surgical blood loss or risk of bleeding: not applicable

## 2020-07-24 NOTE — OR Nursing (Signed)
Discussed continued nausea with Dr. Andree Elk, emesis total 50cc and meds given in OR and PACU.  Reglan given as ordered.  (Bladder scan 87 ml @ 1330)

## 2020-07-24 NOTE — Progress Notes (Signed)
Pt scheduled for TVH and bilateral salpingectomy  . No questions . Labs reviewed . Proceed

## 2020-07-24 NOTE — Anesthesia Procedure Notes (Signed)
Procedure Name: Intubation Date/Time: 07/24/2020 7:47 AM Performed by: Tollie Eth, CRNA Pre-anesthesia Checklist: Patient identified, Patient being monitored, Timeout performed, Emergency Drugs available and Suction available Patient Re-evaluated:Patient Re-evaluated prior to induction Oxygen Delivery Method: Circle system utilized Preoxygenation: Pre-oxygenation with 100% oxygen Induction Type: IV induction Ventilation: Mask ventilation without difficulty Laryngoscope Size: Mac and 3 Grade View: Grade I Tube type: Oral Tube size: 7.0 mm Number of attempts: 1 Airway Equipment and Method: Stylet Placement Confirmation: ETT inserted through vocal cords under direct vision,  positive ETCO2 and breath sounds checked- equal and bilateral Secured at: 21 cm Tube secured with: Tape Dental Injury: Teeth and Oropharynx as per pre-operative assessment

## 2020-07-24 NOTE — Op Note (Signed)
NAME: Diane Roberts, Diane Roberts MEDICAL RECORD [id number] ACCOUNT [id number] DATE OF BIRTH:17-Apr-1981 FACILITY: [facility] LOCATION: [facility] PHYSICIAN:Samyrah Bruster Josefine Class, MD  OPERATIVE REPORT  DATE OF PROCEDURE:  07/24/2020  PREOPERATIVE DIAGNOSIS:  Cervical dysplasia, moderate.  POSTOPERATIVE DIAGNOSIS:  Cervical dysplasia, moderate.  PROCEDURES: 1.  Total vaginal hysterectomy. 2.  Bilateral salpingectomy.  SURGEON:  Laverta Baltimore, MD  FIRST ASSISTANT:  Larey Days, MD  ANESTHESIA:  General endotracheal anesthesia.  INDICATIONS:  A 39 year old, gravida 2, para 2, patient with cervical intraepithelial neoplasia grade I-II.  The patient has elected for definitive surgery and declines alternative treatment.  DESCRIPTION OF PROCEDURE:  After adequate general endotracheal anesthesia, the patient was placed in dorsal supine position, legs in the candy cane stirrups.  The patient's lower abdomen, perineum, and vagina were prepped and draped in normal sterile  fashion.  Timeout was performed.  The patient did receive 2 grams IV Ancef prior to commencement of the case.  Straight catheterization of the bladder yielded 10 mL urine.  A weighted speculum was placed in the posterior vaginal vault, and the cervix was  grasped with 2 thyroid tenacula.  Cervix was circumferentially injected with 1% lidocaine with 1:100,000 epinephrine.  A direct posterior colpotomy incision was made.  Upon entry into the posterior cul-de-sac, a long-billed speculum was placed.  The  uterosacral ligaments were bilaterally clamped, transected, suture ligated with 0 Vicryl suture, and tagged for later identification.  Anterior cervix was circumferentially incised with the Bovie.  The cardinal ligaments were then bilaterally clamped,  transected, suture ligated with 0 Vicryl suture.  Anterior cul-de-sac was entered sharply, and the Deaver retractor was placed within to elevate the bladder anteriorly.  Uterine  arteries were then bilaterally clamped, transected, suture ligated with 0  Vicryl suture.  Sequential bites in the broad ligament to the cornua ensued.  The cornua were then bilaterally clamped and transected, and the cervix and uterus were delivered.  Each pedicle was ligated with 0 Vicryl suture.  Each fallopian tube was  identified with Babcock clamp, mesosalpinx was clamped, and a portion of fallopian tube removed from each side.  Pedicle was secured with 0 Vicryl suture.  Good hemostasis was noted.  The peritoneum was then closed with a 2-0 PDS suture in a pursestring  fashion, and the vaginal cuff was closed with 0 Vicryl suture in a running nonlocking fashion.  Uterosacral ligaments were plicated centrally and the rest of the vault was closed.  Good hemostasis was noted.  Straight catheterization of the bladder at  the end of the case yielded 40 mL clear urine.  COMPLICATIONS:  There were no complications.  ESTIMATED BLOOD LOSS:  25 mL.  INTRAOPERATIVE FLUIDS:  700 mL.  URINE OUTPUT:  50 mL.  DISPOSITION:  The patient was taken to recovery room in good condition.  IN/NUANCE  D:07/24/2020 T:07/24/2020 JOB:[id number]

## 2020-07-25 ENCOUNTER — Encounter: Payer: Self-pay | Admitting: Obstetrics and Gynecology

## 2020-07-27 LAB — SURGICAL PATHOLOGY

## 2020-08-03 DIAGNOSIS — R399 Unspecified symptoms and signs involving the genitourinary system: Secondary | ICD-10-CM | POA: Diagnosis not present

## 2020-08-10 DIAGNOSIS — R350 Frequency of micturition: Secondary | ICD-10-CM | POA: Diagnosis not present

## 2020-08-15 ENCOUNTER — Ambulatory Visit: Payer: BC Managed Care – PPO | Admitting: Gastroenterology

## 2020-08-15 NOTE — Progress Notes (Deleted)
Gastroenterology Consultation  Referring Provider:     Virginia Crews, MD Primary Care Physician:  Virginia Crews, MD Primary Gastroenterologist:  Dr. Allen Norris     Reason for Consultation:     Diane Roberts        HPI:   Diane Roberts is a 40 y.o. y/o female referred for consultation & management of anal Roberts by Dr. Brita Romp, Dionne Bucy, MD.  This patient comes in today after seeing her primary care provider back in June.  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  The patient had reported that the pain from her anal Roberts is limited her walking and was also painful when urinating.  There was some blood on the toilet paper when she wiped and has tried lidocaine for pain.  The patient recently had a total abdominal hysterectomy with a bilateral salpingo-oophorectomy.  Past Medical History:  Diagnosis Date  . Abnormal Pap smear of cervix   . Anemia    after delivery  . Breast mass 2018   bil masses felt by MD  . Frequent headaches   . History of kidney stones    currently as of 04-2018 bil  . Seizure [facility])    age 78 x1  . Thyroid nodule 06/22/2020    Past Surgical History:  Procedure Laterality Date  . ABLATION    . APPENDECTOMY  2012  . BILATERAL SALPINGECTOMY Bilateral 07/24/2020   Procedure: BILATERAL SALPINGECTOMY;  Surgeon: Schermerhorn, Gwen Her, MD;  Location: [facility];  Service: Gynecology;  Laterality: Bilateral;  . CHOLECYSTECTOMY  2002  . LAPAROSCOPIC LYSIS OF ADHESIONS  05/11/2018   Procedure: LAPAROSCOPIC LYSIS OF ADHESIONS;  Surgeon: Brayton Mars, MD;  Location: [facility];  Service: Gynecology;;  . LAPAROSCOPY N/A 05/11/2018   Procedure: LAPAROSCOPY DIAGNOSTIC WITH BIOPSIES;  Surgeon: Brayton Mars, MD;  Location: [facility];  Service: Gynecology;  Laterality: N/A;  . OVARIAN CYST REMOVAL    . TONSILLECTOMY  2010  . VAGINAL HYSTERECTOMY N/A 07/24/2020    Procedure: HYSTERECTOMY VAGINAL;  Surgeon: Schermerhorn, Gwen Her, MD;  Location: [facility];  Service: Gynecology;  Laterality: N/A;    Prior to Admission medications   Not on File    Family History  Problem Relation Age of Onset  . Hypertension Mother   . Kidney disease Mother   . Hyperlipidemia Mother   . Thyroid disease Mother        thyroid cancer  . COPD Mother   . Osteoporosis Mother   . Hypertension Father   . Clotting disorder Maternal Grandmother        reports she died of a blood clot  . COPD Maternal Grandfather   . Gout Maternal Grandfather   . Heart failure Maternal Grandfather   . Heart failure Paternal Grandfather   . Hypertension Paternal Grandfather   . COPD Paternal Grandfather   . Breast cancer Neg Hx   . Ovarian cancer Neg Hx   . Colon cancer Neg Hx      Social History   Tobacco Use  . Smoking status: Current Every Day Smoker    Packs/day: 0.50    Years: 18.00    Pack years: 9.00    Types: E-cigarettes    Last attempt to quit: 04/08/2017    Years since quitting: 3.3  . Smokeless tobacco: Never Used  . Tobacco comment: Vapes now.  Vaping Use  . Vaping Use:  Every day  . Substances: Nicotine, Flavoring  Substance Use Topics  . Alcohol use: Yes    Alcohol/week: 1.0 standard drink    Types: 1 Cans of beer per week    Comment: occasional  . Drug use: No    Allergies as of 08/15/2020  . (No Known Allergies)    Review of Systems:    All systems reviewed and negative except where noted in HPI.   Physical Exam:  There were no vitals taken for this visit. No LMP recorded. Patient has had an ablation. General:   Alert,  Well-developed, well-nourished, pleasant and cooperative in NAD Head:  Normocephalic and atraumatic. Eyes:  Sclera clear, no icterus.   Conjunctiva pink. Ears:  Normal auditory acuity. Neck:  Supple; no masses or thyromegaly. Lungs:  Respirations even and unlabored.  Clear throughout to auscultation.   No wheezes, crackles, or  rhonchi. No acute distress. Heart:  Regular rate and rhythm; no murmurs, clicks, rubs, or gallops. Abdomen:  Normal bowel sounds.  No bruits.  Soft, non-tender and non-distended without masses, hepatosplenomegaly or hernias noted.  No guarding or rebound tenderness.  Negative Carnett sign.   Rectal:  Deferred.  Pulses:  Normal pulses noted. Extremities:  No clubbing or edema.  No cyanosis. Neurologic:  Alert and oriented x3;  grossly normal neurologically. Skin:  Intact without significant lesions or rashes.  No jaundice. Lymph Nodes:  No significant cervical adenopathy. Psych:  Alert and cooperative. Normal mood and affect.  Imaging Studies: No results found.  Assessment and Plan:   Diane Roberts is a 40 y.o. y/o female ***    Diane Minium, MD. Clementeen Graham    Note: This dictation was prepared with Dragon dictation along with smaller phrase technology. Any transcriptional errors that result from this process are unintentional.

## 2020-08-16 ENCOUNTER — Encounter: Payer: Self-pay | Admitting: *Deleted

## 2020-08-25 ENCOUNTER — Ambulatory Visit
Admission: RE | Admit: 2020-08-25 | Discharge: 2020-08-25 | Disposition: A | Discharge status: Home / Self Care | Payer: No Typology Code available for payment source | Source: Ambulatory Visit | Attending: Family Medicine | Admitting: Family Medicine

## 2020-08-25 ENCOUNTER — Other Ambulatory Visit: Payer: Self-pay

## 2020-08-25 DIAGNOSIS — E041 Nontoxic single thyroid nodule: Secondary | ICD-10-CM | POA: Diagnosis not present

## 2020-08-29 ENCOUNTER — Telehealth: Payer: Self-pay

## 2020-08-29 NOTE — Telephone Encounter (Signed)
LMTCB 08/29/2020.  PEC please advise pt of U/S results when she calls back.   Thanks,   -Mickel Baas

## 2020-08-29 NOTE — Telephone Encounter (Signed)
-----   Message from Virginia Crews, MD sent at 08/29/2020 10:07 AM EST ----- Thyroid ultrasound is reassuring.  The thyroid is mildly enlarged. The only nodule is <1cm in diameter and does not need to be biopsied or monitored on imaging. This is benign.

## 2020-09-01 ENCOUNTER — Encounter: Payer: Self-pay | Admitting: Family Medicine

## 2020-09-20 ENCOUNTER — Telehealth: Payer: Self-pay | Admitting: Gastroenterology

## 2020-09-20 NOTE — Telephone Encounter (Signed)
Patient called day of appointment to reschedule due to work and help shortage at her work per patient.  Patient was reminded about NS policy and possibility of charge. Patient voiced understanding.

## 2020-09-21 ENCOUNTER — Ambulatory Visit: Payer: Self-pay | Admitting: Gastroenterology

## 2020-10-18 ENCOUNTER — Encounter: Payer: Self-pay | Admitting: *Deleted

## 2020-10-18 ENCOUNTER — Ambulatory Visit: Payer: Self-pay | Admitting: Gastroenterology

## 2020-10-18 NOTE — Progress Notes (Deleted)
Gastroenterology Consultation  Referring Provider:     Virginia Crews, MD Primary Care Physician:  Virginia Crews, MD Primary Gastroenterologist:  Dr. Allen Norris     Reason for Consultation:     Recurrent anal fissures        HPI:   Diane Roberts is a 40 y.o. y/o female referred for consultation & management of recurrent anal fissures by Dr. Brita Romp, Dionne Bucy, MD.  This patient was seen back in June by his primary care provider in conjunction with a medical student.  The patient had reported recurrent anal fissures for 2 years.    Past Medical History:  Diagnosis Date  . Abnormal Pap smear of cervix   . Anemia    after delivery  . Breast mass 2018   bil masses felt by MD  . Frequent headaches   . History of kidney stones    currently as of 04-2018 bil  . Seizure [facility])    age 32 x1  . Thyroid nodule 06/22/2020    Past Surgical History:  Procedure Laterality Date  . ABLATION    . APPENDECTOMY  2012  . BILATERAL SALPINGECTOMY Bilateral 07/24/2020   Procedure: BILATERAL SALPINGECTOMY;  Surgeon: Schermerhorn, Gwen Her, MD;  Location: [facility];  Service: Gynecology;  Laterality: Bilateral;  . CHOLECYSTECTOMY  2002  . LAPAROSCOPIC LYSIS OF ADHESIONS  05/11/2018   Procedure: LAPAROSCOPIC LYSIS OF ADHESIONS;  Surgeon: Brayton Mars, MD;  Location: [facility];  Service: Gynecology;;  . LAPAROSCOPY N/A 05/11/2018   Procedure: LAPAROSCOPY DIAGNOSTIC WITH BIOPSIES;  Surgeon: Brayton Mars, MD;  Location: [facility];  Service: Gynecology;  Laterality: N/A;  . OVARIAN CYST REMOVAL    . TONSILLECTOMY  2010  . VAGINAL HYSTERECTOMY N/A 07/24/2020   Procedure: HYSTERECTOMY VAGINAL;  Surgeon: Schermerhorn, Gwen Her, MD;  Location: [facility];  Service: Gynecology;  Laterality: N/A;    Prior to Admission medications   Not on File    Family History  Problem Relation Age of Onset  . Hypertension Mother   . Kidney disease Mother   . Hyperlipidemia Mother   . Thyroid  disease Mother        thyroid cancer  . COPD Mother   . Osteoporosis Mother   . Hypertension Father   . Clotting disorder Maternal Grandmother        reports she died of a blood clot  . COPD Maternal Grandfather   . Gout Maternal Grandfather   . Heart failure Maternal Grandfather   . Heart failure Paternal Grandfather   . Hypertension Paternal Grandfather   . COPD Paternal Grandfather   . Breast cancer Neg Hx   . Ovarian cancer Neg Hx   . Colon cancer Neg Hx      Social History   Tobacco Use  . Smoking status: Current Every Day Smoker    Packs/day: 0.50    Years: 18.00    Pack years: 9.00    Types: E-cigarettes    Last attempt to quit: 04/08/2017    Years since quitting: 3.5  . Smokeless tobacco: Never Used  . Tobacco comment: Vapes now.  Vaping Use  . Vaping Use: Every day  . Substances: Nicotine, Flavoring  Substance Use Topics  . Alcohol use: Yes    Alcohol/week: 1.0 standard drink    Types: 1 Cans of beer per week    Comment: occasional  . Drug use: No    Allergies as of 10/18/2020  . (No Known Allergies)  Review of Systems:    All systems reviewed and negative except where noted in HPI.   Physical Exam:  There were no vitals taken for this visit. No LMP recorded. Patient has had an ablation. General:   Alert,  Well-developed, well-nourished, pleasant and cooperative in NAD Head:  Normocephalic and atraumatic. Eyes:  Sclera clear, no icterus.   Conjunctiva pink. Ears:  Normal auditory acuity. Neck:  Supple; no masses or thyromegaly. Lungs:  Respirations even and unlabored.  Clear throughout to auscultation.   No wheezes, crackles, or rhonchi. No acute distress. Heart:  Regular rate and rhythm; no murmurs, clicks, rubs, or gallops. Abdomen:  Normal bowel sounds.  No bruits.  Soft, non-tender and non-distended without masses, hepatosplenomegaly or hernias noted.  No guarding or rebound tenderness.  Negative Carnett sign.   Rectal:  Deferred.  Pulses:   Normal pulses noted. Extremities:  No clubbing or edema.  No cyanosis. Neurologic:  Alert and oriented x3;  grossly normal neurologically. Skin:  Intact without significant lesions or rashes.  No jaundice. Lymph Nodes:  No significant cervical adenopathy. Psych:  Alert and cooperative. Normal mood and affect.  Imaging Studies: No results found.  Assessment and Plan:   Diane Roberts is a 40 y.o. y/o female ***    Lucilla Lame, MD. Marval Regal    Note: This dictation was prepared with Dragon dictation along with smaller phrase technology. Any transcriptional errors that result from this process are unintentional.

## 2020-10-24 ENCOUNTER — Ambulatory Visit: Payer: Self-pay | Admitting: Family Medicine

## 2020-10-24 NOTE — Progress Notes (Deleted)
Complete physical exam   Patient: Diane Roberts   DOB: 1981-04-13   40 y.o. Female  MRN: [id number] Visit Date: 10/24/2020  Today's healthcare provider: Lavon Paganini, MD   No chief complaint on file.  Subjective    Diane Roberts is a 40 y.o. female who presents today for a complete physical exam.  She reports consuming a {diet types:17450} diet. {Exercise:19826} She generally feels {well/fairly well/poorly:18703}. She reports sleeping {well/fairly well/poorly:18703}. She {does/does not:200015} have additional problems to discuss today.  HPI   Anxiety, Follow-up  She was last seen for anxiety 9 months ago. Changes made at last visit include starting Zoloft.   She reports {excellent/good/fair/poor:19665} compliance with treatment. She reports {good/fair/poor:18685} tolerance of treatment. She {is/is not:21021397} having side effects. {document side effects if present:1}  She feels her anxiety is {Desc; severity:60313} and {improved/worse/unchanged:3041574} since last visit.  Symptoms: {Yes/No:20286} chest pain {Yes/No:20286} difficulty concentrating  {Yes/No:20286} dizziness {Yes/No:20286} fatigue  {Yes/No:20286} feelings of losing control {Yes/No:20286} insomnia  {Yes/No:20286} irritable {Yes/No:20286} palpitations  {Yes/No:20286} panic attacks {Yes/No:20286} racing thoughts  {Yes/No:20286} shortness of breath {Yes/No:20286} sweating  {Yes/No:20286} tremors/shakes    GAD-7 Results GAD-7 Generalized Anxiety Disorder Screening Tool 01/20/2020  1. Feeling Nervous, Anxious, or on Edge 1  2. Not Being Able to Stop or Control Worrying 1  3. Worrying Too Much About Different Things 1  4. Trouble Relaxing 0  5. Being So Restless it's Hard To Sit Still 0  6. Becoming Easily Annoyed or Irritable 1  7. Feeling Afraid As If Something Awful Might Happen 1  Total GAD-7 Score 5  Difficulty At Work, Home, or Getting  Along With Others? Not difficult at all    PHQ-9  Scores PHQ9 SCORE ONLY 06/22/2020 01/20/2020 08/30/2019  PHQ-9 Total Score 2 1 2     ---------------------------------------------------------------------------------------------------   Past Medical History:  Diagnosis Date  . Abnormal Pap smear of cervix   . Anemia    after delivery  . Breast mass 2018   bil masses felt by MD  . Frequent headaches   . History of kidney stones    currently as of 04-2018 bil  . Seizure [facility])    age 68 x1  . Thyroid nodule 06/22/2020   Past Surgical History:  Procedure Laterality Date  . ABLATION    . APPENDECTOMY  2012  . BILATERAL SALPINGECTOMY Bilateral 07/24/2020   Procedure: BILATERAL SALPINGECTOMY;  Surgeon: Schermerhorn, Gwen Her, MD;  Location: [facility];  Service: Gynecology;  Laterality: Bilateral;  . CHOLECYSTECTOMY  2002  . LAPAROSCOPIC LYSIS OF ADHESIONS  05/11/2018   Procedure: LAPAROSCOPIC LYSIS OF ADHESIONS;  Surgeon: Brayton Mars, MD;  Location: [facility];  Service: Gynecology;;  . LAPAROSCOPY N/A 05/11/2018   Procedure: LAPAROSCOPY DIAGNOSTIC WITH BIOPSIES;  Surgeon: Brayton Mars, MD;  Location: [facility];  Service: Gynecology;  Laterality: N/A;  . OVARIAN CYST REMOVAL    . TONSILLECTOMY  2010  . VAGINAL HYSTERECTOMY N/A 07/24/2020   Procedure: HYSTERECTOMY VAGINAL;  Surgeon: Schermerhorn, Gwen Her, MD;  Location: [facility] OR[facility];  Service: Gynecology;  Laterality: N/A;   Social History   Socioeconomic History  . Marital status: Single    Spouse name: Not on file  . Number of children: 2  . Years of education: Not on file  . Highest education level: Not on file  Occupational History  . Occupation: quality lead  Tobacco Use  . Smoking status: Current Every Day Smoker    Packs/day: 0.50  Years: 18.00    Pack years: 9.00    Types: E-cigarettes    Last attempt to quit: 04/08/2017    Years since quitting: 3.5  . Smokeless tobacco: Never Used  . Tobacco comment: Vapes now.  Vaping Use  . Vaping Use: Every day   . Substances: Nicotine, Flavoring  Substance and Sexual Activity  . Alcohol use: Yes    Alcohol/week: 1.0 standard drink    Types: 1 Cans of beer per week    Comment: occasional  . Drug use: No  . Sexual activity: Yes    Partners: Male    Comment: ablation  Other Topics Concern  . Not on file  Social History Narrative   Mother lives in the home   Social Determinants of Health   Financial Resource Strain: Not on file  Food Insecurity: Not on file  Transportation Needs: Not on file  Physical Activity: Not on file  Stress: Not on file  Social Connections: Not on file  Intimate Partner Violence: Not on file   Family Status  Relation Name Status  . Mother  Alive  . Father  Alive  . MGM  Deceased  . MGF  Deceased  . PGF  Deceased  . Neg Hx  (Not Specified)   Family History  Problem Relation Age of Onset  . Hypertension Mother   . Kidney disease Mother   . Hyperlipidemia Mother   . Thyroid disease Mother        thyroid cancer  . COPD Mother   . Osteoporosis Mother   . Hypertension Father   . Clotting disorder Maternal Grandmother        reports she died of a blood clot  . COPD Maternal Grandfather   . Gout Maternal Grandfather   . Heart failure Maternal Grandfather   . Heart failure Paternal Grandfather   . Hypertension Paternal Grandfather   . COPD Paternal Grandfather   . Breast cancer Neg Hx   . Ovarian cancer Neg Hx   . Colon cancer Neg Hx    No Known Allergies  Patient Care Team: Virginia Crews, MD as PCP - General (Family Medicine)   Medications: No outpatient medications prior to visit.   No facility-administered medications prior to visit.    Review of Systems  {Labs  Heme  Chem  Endocrine  Serology  Results Review (optional):23779::" "}  Objective    There were no vitals taken for this visit. {Show previous vital signs (optional):23777::" "}  Physical Exam  ***  Last depression screening scores PHQ 2/9 Scores 06/22/2020  01/20/2020 08/30/2019  PHQ - 2 Score 0 1 0  PHQ- 9 Score 2 1 2    Last fall risk screening Fall Risk  06/22/2020  Falls in the past year? 0  Number falls in past yr: 0  Injury with Fall? 0  Risk for fall due to : No Fall Risks  Follow up Falls evaluation completed   Last Audit-C alcohol use screening Alcohol Use Disorder Test (AUDIT) 06/22/2020  1. How often do you have a drink containing alcohol? 3  2. How many drinks containing alcohol do you have on a typical day when you are drinking? 0  3. How often do you have six or more drinks on one occasion? 1  AUDIT-C Score 4   A score of 3 or more in women, and 4 or more in men indicates increased risk for alcohol abuse, EXCEPT if all of the points are from question 1  No results found for any visits on 10/24/20.  Assessment & Plan    Routine Health Maintenance and Physical Exam  Exercise Activities and Dietary recommendations Goals   None     Immunization History  Administered Date(s) Administered  . Influenza,inj,Quad PF,6+ Mos 05/26/2019, 04/12/2020  . Influenza-Unspecified 06/12/2013    Health Maintenance  Topic Date Due  . Hepatitis C Screening  Never done  . COVID-19 Vaccine (1) Never done  . HIV Screening  Never done  . PAP SMEAR-Modifier  03/22/2023  . TETANUS/TDAP  05/29/2027  . INFLUENZA VACCINE  Completed  . HPV VACCINES  Aged Out    Discussed health benefits of physical activity, and encouraged her to engage in regular exercise appropriate for her age and condition.  ***  No follow-ups on file.     {provider attestation***:1}   Lavon Paganini, MD  [facility] [phone] (phone) [phone] (fax)  [place]

## 2021-04-26 ENCOUNTER — Ambulatory Visit: Payer: Self-pay

## 2021-04-26 NOTE — Telephone Encounter (Signed)
Can see if anyone has availability. May also offer [place] or [place] possibly.

## 2021-04-26 NOTE — Telephone Encounter (Signed)
Scheduled for 05/02/21

## 2021-04-26 NOTE — Telephone Encounter (Signed)
Pt. Reports she has been having sharp "shooting pains in my chest for 2 weeks." Pain lasts a couple of seconds. Comes and goes. Reports she is under "a lot of stress. My fiance just died, my daughter is locked up, my mother is not doing well." Also felt a lump in her left breat this morning at the nipple.Requests to be seen. No availability. Instructed to go to ED if chest pain returns. Spoke with Arbie Cookey in the practice and forward triage.     Reason for Disposition  [1] Chest pain lasts < 5 minutes AND [2] NO chest pain or cardiac symptoms (e.g., breathing difficulty, sweating) now (Exception: chest pains that last only a few seconds)  Answer Assessment - Initial Assessment Questions 1. LOCATION: "Where does it hurt?"       Left side and arm 2. RADIATION: "Does the pain go anywhere else?" (e.g., into neck, jaw, arms, back)     Arm 3. ONSET: "When did the chest pain begin?" (Minutes, hours or days)      2 weeks ago 4. PATTERN "Does the pain come and go, or has it been constant since it started?"  "Does it get worse with exertion?"      Comes and goes 5. DURATION: "How long does it last" (e.g., seconds, minutes, hours)     Seconds 6. SEVERITY: "How bad is the pain?"  (e.g., Scale 1-10; mild, moderate, or severe)    - MILD (1-3): doesn't interfere with normal activities     - MODERATE (4-7): interferes with normal activities or awakens from sleep    - SEVERE (8-10): excruciating pain, unable to do any normal activities       Sharp pain - 9 7. CARDIAC RISK FACTORS: "Do you have any history of heart problems or risk factors for heart disease?" (e.g., angina, prior heart attack; diabetes, high blood pressure, high cholesterol, smoker, or strong family history of heart disease)     No 8. PULMONARY RISK FACTORS: "Do you have any history of lung disease?"  (e.g., blood clots in lung, asthma, emphysema, birth control pills)     No 9. CAUSE: "What do you think is causing the chest pain?"      Stress 10. OTHER SYMPTOMS: "Do you have any other symptoms?" (e.g., dizziness, nausea, vomiting, sweating, fever, difficulty breathing, cough)       No 11. PREGNANCY: "Is there any chance you are pregnant?" "When was your last menstrual period?"       No  Protocols used: Chest Pain-A-AH

## 2021-05-02 ENCOUNTER — Encounter: Payer: Self-pay | Admitting: Family Medicine

## 2021-05-02 ENCOUNTER — Ambulatory Visit: Payer: No Typology Code available for payment source | Admitting: Family Medicine

## 2021-05-02 ENCOUNTER — Other Ambulatory Visit: Payer: Self-pay

## 2021-05-02 VITALS — BP 122/62 | HR 79 | Resp 16 | Ht 64.0 in | Wt 140.0 lb

## 2021-05-02 DIAGNOSIS — N63 Unspecified lump in unspecified breast: Secondary | ICD-10-CM | POA: Diagnosis not present

## 2021-05-02 DIAGNOSIS — Z23 Encounter for immunization: Secondary | ICD-10-CM | POA: Diagnosis not present

## 2021-05-02 DIAGNOSIS — R0789 Other chest pain: Secondary | ICD-10-CM | POA: Diagnosis not present

## 2021-05-02 NOTE — Patient Instructions (Signed)
Call [employer] or see if employer has counseling.

## 2021-05-02 NOTE — Progress Notes (Signed)
      Established patient visit   Patient: Diane Roberts   DOB: 1980/12/27   40 y.o. Female  MRN: [id number] Visit Date: 05/02/2021  Today's healthcare provider: Wilhemena Durie, MD   Chief Complaint  Patient presents with   Chest Pain   Breast Mass   Subjective    Chest Pain  This is a recurrent problem. The current episode started more than 1 year ago. The onset quality is gradual. The problem occurs intermittently. The problem has been gradually worsening. The pain is moderate. The quality of the pain is described as pressure and tightness. The pain does not radiate. Pertinent negatives include no back pain, dizziness, lower extremity edema, malaise/fatigue, nausea, syncope or weakness. The pain is aggravated by nothing. She has tried nothing for the symptoms.    Patient also mentions that she has a mass in her left breast that she has noticed it about 1-2 weeks ago.  No drainage from her nipple.     Medications: No outpatient medications prior to visit.   No facility-administered medications prior to visit.    Review of Systems  Constitutional:  Negative for malaise/fatigue.  Cardiovascular:  Positive for chest pain. Negative for syncope.  Gastrointestinal:  Negative for nausea.  Musculoskeletal:  Negative for back pain.  Neurological:  Negative for dizziness and weakness.       Objective    BP 122/62   Pulse 79   Resp 16   Ht 5\' 4"  (1.626 m)   Wt 140 lb (63.5 kg)   BMI 24.03 kg/m  BP Readings from Last 3 Encounters:  05/02/21 122/62  07/24/20 109/67  06/22/20 99/62   Wt Readings from Last 3 Encounters:  05/02/21 140 lb (63.5 kg)  07/17/20 147 lb (66.7 kg)  06/22/20 151 lb (68.5 kg)      Physical Exam Constitutional:      Appearance: She is well-developed.  Cardiovascular:     Rate and Rhythm: Normal rate and regular rhythm.     Heart sounds: Normal heart sounds.  Pulmonary:     Effort: Pulmonary effort is normal.     Breath sounds: Normal  breath sounds.  Chest:  Breasts:    Right: Normal. No swelling, bleeding, inverted nipple, mass, nipple discharge, skin change or tenderness.     Left: Tenderness present. No swelling, bleeding, inverted nipple, mass, nipple discharge or skin change.  Musculoskeletal:     Cervical back: Neck supple.  Neurological:     Mental Status: She is alert.    ECG with nonspecific anterior T wave inversion  No results found for any visits on 05/02/21.  Assessment & Plan     1. Chest discomfort Pulse ox is 98% at rest.  No signs or symptoms are of PE or pneumonia.  This seems to be from her breast pain.  Cardiology referral if chest pain persists. - EKG 12-Lead  2. Breast lump Dense breasts, refer for diagnostic mammography as she is tender on the left. - MM Digital Diagnostic Bilat; Future  3. Need for influenza vaccination  - Flu Vaccine QUAD 6+ mos PF IM (Fluarix Quad PF)   No follow-ups on file.      I, Wilhemena Durie, MD, have reviewed all documentation for this visit. The documentation on 05/05/21 for the exam, diagnosis, procedures, and orders are all accurate and complete.    Brianna Bennett Cranford Mon, MD  [facility] [phone] (phone) [phone] (fax)  [place]

## 2021-05-04 ENCOUNTER — Telehealth: Payer: Self-pay

## 2021-05-04 DIAGNOSIS — N63 Unspecified lump in unspecified breast: Secondary | ICD-10-CM

## 2021-05-04 NOTE — Telephone Encounter (Signed)
Copied from [place] [phone]. Topic: General - Other >> May 04, 2021  4:03 PM Parke Poisson wrote: Reason for CRM: [place] Poli will need to know clock position of breast mass and will want an ultrasound order,Thanks

## 2021-05-07 ENCOUNTER — Other Ambulatory Visit: Payer: Self-pay

## 2021-05-07 DIAGNOSIS — N63 Unspecified lump in unspecified breast: Secondary | ICD-10-CM

## 2021-05-10 ENCOUNTER — Ambulatory Visit
Admission: RE | Admit: 2021-05-10 | Discharge: 2021-05-10 | Disposition: A | Discharge status: Home / Self Care | Payer: No Typology Code available for payment source | Source: Ambulatory Visit | Attending: Family Medicine | Admitting: Family Medicine

## 2021-05-10 ENCOUNTER — Other Ambulatory Visit: Payer: Self-pay

## 2021-05-10 DIAGNOSIS — N63 Unspecified lump in unspecified breast: Secondary | ICD-10-CM | POA: Diagnosis present

## 2021-05-11 ENCOUNTER — Other Ambulatory Visit: Payer: Self-pay | Admitting: Family Medicine

## 2021-05-11 DIAGNOSIS — R928 Other abnormal and inconclusive findings on diagnostic imaging of breast: Secondary | ICD-10-CM

## 2021-05-11 DIAGNOSIS — N6320 Unspecified lump in the left breast, unspecified quadrant: Secondary | ICD-10-CM

## 2021-05-18 ENCOUNTER — Ambulatory Visit
Admission: RE | Admit: 2021-05-18 | Discharge: 2021-05-18 | Disposition: A | Discharge status: Home / Self Care | Payer: No Typology Code available for payment source | Source: Ambulatory Visit | Attending: Family Medicine | Admitting: Family Medicine

## 2021-05-18 ENCOUNTER — Other Ambulatory Visit: Payer: Self-pay

## 2021-05-18 DIAGNOSIS — N6320 Unspecified lump in the left breast, unspecified quadrant: Secondary | ICD-10-CM | POA: Diagnosis present

## 2021-05-18 DIAGNOSIS — R928 Other abnormal and inconclusive findings on diagnostic imaging of breast: Secondary | ICD-10-CM | POA: Diagnosis not present

## 2021-05-18 DIAGNOSIS — N6321 Unspecified lump in the left breast, upper outer quadrant: Secondary | ICD-10-CM | POA: Insufficient documentation

## 2021-05-21 LAB — SURGICAL PATHOLOGY

## 2021-05-23 NOTE — Progress Notes (Addendum)
Received notification from Electa Sniff at [facility] that patient had benign breast biopsy, with surgical consult recommended by radiologist.  Left two voice messages for patient.  She left a voice message this morning , but no answer on call back.  I will notify primary provider if unable to contact patient. Patient returned call. Scheduled with Dr. Windell Moment 05/24/21 at 11:00.

## 2021-05-24 ENCOUNTER — Ambulatory Visit: Payer: Self-pay | Admitting: General Surgery

## 2021-05-24 NOTE — H&P (Signed)
PATIENT PROFILE: Diane Roberts is a 40 y.o. female who presents to the Clinic for consultation at the request of Dr. 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    PCP:  [facility]  HISTORY OF PRESENT ILLNESS: Diane Roberts reports that initially her primary care physician palpated a mass in the left breast 4 years ago.  This led to diagnostic mammogram.  Diagnostic mammogram and ultrasound showed a distortion of the left breast at 1 o'clock position.  Core biopsy was done showing pseudo angiomatous stromal hyperplasia.  4 years later patient personally palpated a big mass in her left breast.  New mammogram and ultrasound showed a 7 cm mass.  New biopsy showed pseudo angiomatous stromal hyperplasia.  Patient denies any significant pain.  No pain radiation.  No alleviating or rating factors.  There is mild soreness.  Patient denies any nipple discharge.  There has been no skin changes.  Family history of breast cancer: None Family history of other cancers: Mother with thyroid cancer Menarche: 13 years old Menopause: Early 30s due to partial hysterectomy due to bleeding Used OCP: None Used estrogen and progesterone therapy: None History of Radiation to the chest: None Number of pregnancies: 2 Age of first pregnancy: 20 Previous biopsy: Yes  PROBLEM LIST: Problem List  Date Reviewed: 05/16/2016          Noted   Obesity (BMI 30.0-34.9), unspecified 11/18/2014       GENERAL REVIEW OF SYSTEMS:   General ROS: negative for - chills, fatigue, fever, weight gain or weight loss Allergy and Immunology ROS: negative for - hives  Hematological and Lymphatic ROS: negative for - bleeding problems or bruising, negative for palpable nodes Endocrine ROS: negative for - heat or cold intolerance, hair changes Respiratory ROS: negative for - cough, shortness of breath or wheezing Cardiovascular ROS: no chest pain or palpitations GI ROS: negative for  nausea, vomiting, abdominal pain, diarrhea, constipation Musculoskeletal ROS: negative for - joint swelling or muscle pain Neurological ROS: negative for - confusion, syncope Dermatological ROS: negative for pruritus and rash Psychiatric: negative for anxiety, depression, difficulty sleeping and memory loss  MEDICATIONS: Current Outpatient Medications  Medication Sig Dispense Refill   citalopram (CELEXA) 20 MG tablet Take 1 tablet (20 mg total) by mouth once daily (Patient not taking: Reported on 04/19/2020  ) 30 tablet 1   Compound Medication Med Name: Nifedipine 0.3% plus lidocaine 2% cream. Apply to anal area two times a day and after every bowel movement. (Patient not taking: No sig reported) 30 each 0   gabapentin (NEURONTIN) 300 MG capsule Take 1 capsule (300 mg total) by mouth nightly for 10 days 10 capsule 0   ibuprofen (MOTRIN) 800 MG tablet Take 1 tablet (800 mg total) by mouth every 8 (eight) hours as needed for Pain (Patient not taking: No sig reported) 30 tablet 1   ondansetron (ZOFRAN) 8 MG tablet Take 1 tablet (8 mg total) by mouth every 8 (eight) hours as needed for Nausea (Patient not taking: No sig reported) 30 tablet 0   oxybutynin (DITROPAN-XL) 5 MG XL tablet Take 1 tablet (5 mg total) by mouth once daily 90 tablet 3   oxyCODONE-acetaminophen (PERCOCET) 5-325 mg tablet Take 1 tablet by mouth every 4 (four) hours as needed for Pain (Patient not taking: No sig reported) 20 tablet 0   phentermine (ADIPEX-P) 37.5 MG capsule Take 37.5 mg by mouth every morning before breakfast    (Patient not taking: Reported   on 05/24/2021)     propranoloL (INDERAL) 20 MG tablet Take 1 tablet (20 mg total) by mouth 2 (two) times daily as needed 60 tablet 1   No current facility-administered medications for this visit.    ALLERGIES: Patient has no known allergies.  PAST MEDICAL HISTORY: Past Medical History:  Diagnosis Date   Benign breast lumps    CIN I (cervical intraepithelial neoplasia I)      PAST SURGICAL HISTORY: Past Surgical History:  Procedure Laterality Date   APPENDECTOMY  2013   CHOLECYSTECTOMY     COLPOSCOPY     CYSTECTOMY OVARY LAPAROSCOPIC  2012   DIAGNOSTIC LAPAROSCOPY  2019   ENDOMETRIAL ABLATION W/ NOVASURE  2016   HYSTERECTOMY  07/24/2020   TVH still has ovaries   TONSILLECTOMY  2010   TVH with bilateral salpingectomy  07/24/2020     FAMILY HISTORY: Family History  Problem Relation Age of Onset   Heart disease Mother    High blood pressure (Hypertension) Mother    Kidney failure Mother    Osteoporosis (Thinning of bones) Mother    Thyroid disease Mother    Hyperlipidemia (Elevated cholesterol) Mother    High blood pressure (Hypertension) Father    No Known Problems Daughter    ADD / ADHD Son    Kidney disease Maternal Grandmother    Pulmonary embolism Maternal Grandmother    Pulmonary embolism Maternal Grandfather    High blood pressure (Hypertension) Maternal Grandfather    Hyperlipidemia (Elevated cholesterol) Maternal Grandfather    No Known Problems Paternal Grandmother    Lumbar disc disease Paternal Grandfather    Obesity Paternal Grandfather    No Known Problems Half-Brother      SOCIAL HISTORY: Social History   Socioeconomic History   Marital status: Single  Tobacco Use   Smoking status: Former Smoker    Packs/day: 1.00    Types: Cigarettes    Quit date: 2018    Years since quitting: 4.7   Smokeless tobacco: Never Used  Vaping Use   Vaping Use: Never used  Substance and Sexual Activity   Alcohol use: Yes    Comment: occasionally    Drug use: No   Sexual activity: Yes    Partners: Male    Birth control/protection: Surgical    PHYSICAL EXAM: Vitals:   05/24/21 1121  BP: 110/75  Pulse: 71   Body mass index is 23.69 kg/m. Weight: 62.6 kg (138 lb)   GENERAL: Alert, active, oriented x3  HEENT: Pupils equal reactive to light. Extraocular movements are intact. Sclera clear. Palpebral conjunctiva normal red  color.Pharynx clear.  NECK: Supple with no palpable mass and no adenopathy.  LUNGS: Sound clear with no rales rhonchi or wheezes.  HEART: Regular rhythm S1 and S2 without murmur.  BREAST: right breast normal without mass, skin or nipple changes or axillary nodes.  There is a large abnormal mass palpable on the upper outer quadrant of the left breast.  No nipple discharge.  No nipple retraction.  No skin changes.  Mass is mobile.  ABDOMEN: Soft and depressible, nontender with no palpable mass, no hepatomegaly.  EXTREMITIES: Well-developed well-nourished symmetrical with no dependent edema.  NEUROLOGICAL: Awake alert oriented, facial expression symmetrical, moving all extremities.  REVIEW OF DATA: I have reviewed the following data today: Initial consult on 05/24/2021  Component Date Value   WBC (White Blood Cell Co* 05/24/2021 4.8    RBC (Red Blood Cell Coun* 05/24/2021 4.48    Hemoglobin 05/24/2021 13.9      Hematocrit 05/24/2021 40.7    MCV (Mean Corpuscular Vo* 05/24/2021 90.8    MCH (Mean Corpuscular He* 05/24/2021 31.0    MCHC (Mean Corpuscular H* 05/24/2021 34.2    Platelet Count 05/24/2021 166    RDW-CV (Red Cell Distrib* 05/24/2021 11.7    MPV (Mean Platelet Volum* 05/24/2021 11.5    Neutrophils 05/24/2021 2.76    Lymphocytes 05/24/2021 1.48    Monocytes 05/24/2021 0.42    Eosinophils 05/24/2021 0.08    Basophils 05/24/2021 0.05    Neutrophil % 05/24/2021 57.5    Lymphocyte % 05/24/2021 30.8    Monocyte % 05/24/2021 8.8    Eosinophil % 05/24/2021 1.7    Basophil% 05/24/2021 1.0    Immature Granulocyte % 05/24/2021 0.2    Immature Granulocyte Cou* 05/24/2021 0.01    Glucose 05/24/2021 89    Sodium 05/24/2021 139    Potassium 05/24/2021 4.2    Chloride 05/24/2021 106    Carbon Dioxide (CO2) 05/24/2021 28.0    Urea Nitrogen (BUN) 05/24/2021 14    Creatinine 05/24/2021 0.8    Glomerular Filtration Ra* 05/24/2021 80    Calcium 05/24/2021 9.4    AST  05/24/2021 18     ALT  05/24/2021 13    Alk Phos (alkaline Phosp* 05/24/2021 48    Albumin 05/24/2021 4.5    Bilirubin, Total 05/24/2021 0.9    Protein, Total 05/24/2021 6.4    A/G Ratio 05/24/2021 2.4      ASSESSMENT: Diane Roberts is a 40 y.o. female presenting for consultation for rapidly enlarging left breast mass.    Patient was oriented again about the pathology results.  Discussed with patient that this pathology is benign.  Due to the rapidly increase in size of the mass up to 7 cm I considered that surgical excision is recommended.  Surgical alternatives were discussed with patient including partial vs total mastectomy. Surgical technique and post operative care was discussed with patient. Risk of surgery was discussed with patient including but not limited to: wound infection, seroma, hematoma, brachial plexopathy, mondor's disease (thrombosis of small veins of breast), chronic wound pain, breast lymphedema, altered sensation to the nipple and cosmesis among others.   Patient elected to proceed with excision of the mass with partial mastectomy.  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  Left breast excision of mass (19120) 2.  CBC, CMP 3.  Avoid taking aspirin 5 days before surgery 4.  Contact us if you have any concern  Patient verbalized understanding, all questions were answered, and were agreeable with the plan outlined above.     Dafne Nield Cintron-Diaz, MD  Electronically signed by Jaydy Fitzhenry Cintron-Diaz, MD  

## 2021-05-24 NOTE — H&P (View-Only) (Signed)
PATIENT PROFILE: Diane Roberts is a 40 y.o. female who presents to the Clinic for consultation at the request of Dr. 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    PCP:  [facility]  HISTORY OF PRESENT ILLNESS: Diane Roberts reports that initially her primary care physician palpated a mass in the left breast 4 years ago.  This led to diagnostic mammogram.  Diagnostic mammogram and ultrasound showed a distortion of the left breast at 1 o'clock position.  Core biopsy was done showing pseudo angiomatous stromal hyperplasia.  4 years later patient personally palpated a big mass in her left breast.  New mammogram and ultrasound showed a 7 cm mass.  New biopsy showed pseudo angiomatous stromal hyperplasia.  Patient denies any significant pain.  No pain radiation.  No alleviating or rating factors.  There is mild soreness.  Patient denies any nipple discharge.  There has been no skin changes.  Family history of breast cancer: None Family history of other cancers: Mother with thyroid cancer Menarche: 35 years old Menopause: Early 76s due to partial hysterectomy due to bleeding Used OCP: None Used estrogen and progesterone therapy: None History of Radiation to the chest: None Number of pregnancies: 2 Age of first pregnancy: 20 Previous biopsy: Yes  PROBLEM LIST: Problem List  Date Reviewed: 05/16/2016          Noted   Obesity (BMI 30.0-34.9), unspecified 11/18/2014       GENERAL REVIEW OF SYSTEMS:   General ROS: negative for - chills, fatigue, fever, weight gain or weight loss Allergy and Immunology ROS: negative for - hives  Hematological and Lymphatic ROS: negative for - bleeding problems or bruising, negative for palpable nodes Endocrine ROS: negative for - heat or cold intolerance, hair changes Respiratory ROS: negative for - cough, shortness of breath or wheezing Cardiovascular ROS: no chest pain or palpitations GI ROS: negative for  nausea, vomiting, abdominal pain, diarrhea, constipation Musculoskeletal ROS: negative for - joint swelling or muscle pain Neurological ROS: negative for - confusion, syncope Dermatological ROS: negative for pruritus and rash Psychiatric: negative for anxiety, depression, difficulty sleeping and memory loss  MEDICATIONS: Current Outpatient Medications  Medication Sig Dispense Refill   citalopram (CELEXA) 20 MG tablet Take 1 tablet (20 mg total) by mouth once daily (Patient not taking: Reported on 04/19/2020  ) 30 tablet 1   Compound Medication Med Name: Nifedipine 0.3% plus lidocaine 2% cream. Apply to anal area two times a day and after every bowel movement. (Patient not taking: No sig reported) 30 each 0   gabapentin (NEURONTIN) 300 MG capsule Take 1 capsule (300 mg total) by mouth nightly for 10 days 10 capsule 0   ibuprofen (MOTRIN) 800 MG tablet Take 1 tablet (800 mg total) by mouth every 8 (eight) hours as needed for Pain (Patient not taking: No sig reported) 30 tablet 1   ondansetron (ZOFRAN) 8 MG tablet Take 1 tablet (8 mg total) by mouth every 8 (eight) hours as needed for Nausea (Patient not taking: No sig reported) 30 tablet 0   oxybutynin (DITROPAN-XL) 5 MG XL tablet Take 1 tablet (5 mg total) by mouth once daily 90 tablet 3   oxyCODONE-acetaminophen (PERCOCET) 5-325 mg tablet Take 1 tablet by mouth every 4 (four) hours as needed for Pain (Patient not taking: No sig reported) 20 tablet 0   phentermine (ADIPEX-P) 37.5 MG capsule Take 37.5 mg by mouth every morning before breakfast    (Patient not taking: Reported  on 05/24/2021)     propranoloL (INDERAL) 20 MG tablet Take 1 tablet (20 mg total) by mouth 2 (two) times daily as needed 60 tablet 1   No current facility-administered medications for this visit.    ALLERGIES: Patient has no known allergies.  PAST MEDICAL HISTORY: Past Medical History:  Diagnosis Date   Benign breast lumps    CIN I (cervical intraepithelial neoplasia I)      PAST SURGICAL HISTORY: Past Surgical History:  Procedure Laterality Date   APPENDECTOMY  2013   CHOLECYSTECTOMY     COLPOSCOPY     CYSTECTOMY OVARY LAPAROSCOPIC  2012   DIAGNOSTIC LAPAROSCOPY  2019   ENDOMETRIAL ABLATION W/ NOVASURE  2016   HYSTERECTOMY  07/24/2020   TVH still has ovaries   TONSILLECTOMY  2010   TVH with bilateral salpingectomy  07/24/2020     FAMILY HISTORY: Family History  Problem Relation Age of Onset   Heart disease Mother    High blood pressure (Hypertension) Mother    Kidney failure Mother    Osteoporosis (Thinning of bones) Mother    Thyroid disease Mother    Hyperlipidemia (Elevated cholesterol) Mother    High blood pressure (Hypertension) Father    No Known Problems Daughter    ADD / ADHD Son    Kidney disease Maternal Grandmother    Pulmonary embolism Maternal Grandmother    Pulmonary embolism Maternal Grandfather    High blood pressure (Hypertension) Maternal Grandfather    Hyperlipidemia (Elevated cholesterol) Maternal Grandfather    No Known Problems Paternal Grandmother    Lumbar disc disease Paternal Grandfather    Obesity Paternal Grandfather    No Known Problems Half-Brother      SOCIAL HISTORY: Social History   Socioeconomic History   Marital status: Single  Tobacco Use   Smoking status: Former Smoker    Packs/day: 1.00    Types: Cigarettes    Quit date: 2018    Years since quitting: 4.7   Smokeless tobacco: Never Used  Scientific laboratory technician Use: Never used  Substance and Sexual Activity   Alcohol use: Yes    Comment: occasionally    Drug use: No   Sexual activity: Yes    Partners: Male    Birth control/protection: Surgical    PHYSICAL EXAM: Vitals:   05/24/21 1121  BP: 110/75  Pulse: 71   Body mass index is 23.69 kg/m. Weight: 62.6 kg (138 lb)   GENERAL: Alert, active, oriented x3  HEENT: Pupils equal reactive to light. Extraocular movements are intact. Sclera clear. Palpebral conjunctiva normal red  color.Pharynx clear.  NECK: Supple with no palpable mass and no adenopathy.  LUNGS: Sound clear with no rales rhonchi or wheezes.  HEART: Regular rhythm S1 and S2 without murmur.  BREAST: right breast normal without mass, skin or nipple changes or axillary nodes.  There is a large abnormal mass palpable on the upper outer quadrant of the left breast.  No nipple discharge.  No nipple retraction.  No skin changes.  Mass is mobile.  ABDOMEN: Soft and depressible, nontender with no palpable mass, no hepatomegaly.  EXTREMITIES: Well-developed well-nourished symmetrical with no dependent edema.  NEUROLOGICAL: Awake alert oriented, facial expression symmetrical, moving all extremities.  REVIEW OF DATA: I have reviewed the following data today: Initial consult on 05/24/2021  Component Date Value   WBC (White Blood Cell Co* 05/24/2021 4.8    RBC (Red Blood Cell Coun* 05/24/2021 4.48    Hemoglobin 05/24/2021 13.9  Hematocrit 05/24/2021 40.7    MCV (Mean Corpuscular Vo* 05/24/2021 90.8    MCH (Mean Corpuscular He* 05/24/2021 31.0    MCHC (Mean Corpuscular H* 05/24/2021 34.2    Platelet Count 05/24/2021 166    RDW-CV (Red Cell Distrib* 05/24/2021 11.7    MPV (Mean Platelet Volum* 05/24/2021 11.5    Neutrophils 05/24/2021 2.76    Lymphocytes 05/24/2021 1.48    Monocytes 05/24/2021 0.42    Eosinophils 05/24/2021 0.08    Basophils 05/24/2021 0.05    Neutrophil % 05/24/2021 57.5    Lymphocyte % 05/24/2021 30.8    Monocyte % 05/24/2021 8.8    Eosinophil % 05/24/2021 1.7    Basophil% 05/24/2021 1.0    Immature Granulocyte % 05/24/2021 0.2    Immature Granulocyte Cou* 05/24/2021 0.01    Glucose 05/24/2021 89    Sodium 05/24/2021 139    Potassium 05/24/2021 4.2    Chloride 05/24/2021 106    Carbon Dioxide (CO2) 05/24/2021 28.0    Urea Nitrogen (BUN) 05/24/2021 14    Creatinine 05/24/2021 0.8    Glomerular Filtration Ra* 05/24/2021 80    Calcium 05/24/2021 9.4    AST  05/24/2021 18     ALT  05/24/2021 13    Alk Phos (alkaline Phosp* 05/24/2021 48    Albumin 05/24/2021 4.5    Bilirubin, Total 05/24/2021 0.9    Protein, Total 05/24/2021 6.4    A/G Ratio 05/24/2021 2.4      ASSESSMENT: Ms. Stege is a 40 y.o. female presenting for consultation for rapidly enlarging left breast mass.    Patient was oriented again about the pathology results.  Discussed with patient that this pathology is benign.  Due to the rapidly increase in size of the mass up to 7 cm I considered that surgical excision is recommended.  Surgical alternatives were discussed with patient including partial vs total mastectomy. Surgical technique and post operative care was discussed with patient. Risk of surgery was discussed with patient including but not limited to: wound infection, seroma, hematoma, brachial plexopathy, mondor's disease (thrombosis of small veins of breast), chronic wound pain, breast lymphedema, altered sensation to the nipple and cosmesis among others.   Patient elected to proceed with excision of the mass with partial mastectomy.  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  Left breast excision of mass (19120) 2.  CBC, CMP 3.  Avoid taking aspirin 5 days before surgery 4.  Contact us if you have any concern  Patient verbalized understanding, all questions were answered, and were agreeable with the plan outlined above.     Herbert Pun, MD  Electronically signed by Herbert Pun, MD

## 2021-05-29 ENCOUNTER — Encounter
Admission: RE | Admit: 2021-05-29 | Discharge: 2021-05-29 | Disposition: A | Discharge status: Home / Self Care | Payer: No Typology Code available for payment source | Source: Ambulatory Visit | Attending: General Surgery | Admitting: General Surgery

## 2021-05-29 ENCOUNTER — Other Ambulatory Visit: Payer: Self-pay

## 2021-05-29 NOTE — Patient Instructions (Signed)
Your procedure is scheduled on: 05/30/2021 Report to the Registration Desk on the 1st floor of the [facility]. To find out your arrival time, please call [phone] between 1PM - 3PM on: 05/29/2021  REMEMBER: Instructions that are not followed completely may result in serious medical risk, up to and including death; or upon the discretion of your surgeon and anesthesiologist your surgery may need to be rescheduled.  Do not eat food or drink after midnight the night before surgery.  No gum chewing, lozengers or hard candies.  One week prior to surgery: Stop Anti-inflammatories (NSAIDS) such as Advil, Aleve, Ibuprofen, Motrin, Naproxen, Naprosyn and Aspirin based products such as Excedrin, Goodys Powder, BC Powder. You may however, continue to take Tylenol if needed for pain up until the day of surgery. Stop ANY OVER THE COUNTER vitamins and supplements until after surgery.  No Alcohol for 24 hours before or after surgery.  No Smoking including e-cigarettes for 24 hours prior to surgery.  No chewable tobacco products for at least 6 hours prior to surgery.  No nicotine patches on the day of surgery.  Do not use any "recreational" drugs for at least a week prior to your surgery.  Please be advised that the combination of cocaine and anesthesia may have negative outcomes, up to and including death. If you test positive for cocaine, your surgery will be cancelled.  On the morning of surgery brush your teeth with toothpaste and water, you may rinse your mouth with mouthwash if you wish. Do not swallow any toothpaste or mouthwash.  Use CHG Soap or wipes as directed on instruction sheet.  Do not wear jewelry, make-up, hairpins, clips or nail polish.  Do not wear lotions, powders, or perfumes.   Do not shave body from the neck down 48 hours prior to surgery just in case you cut yourself which could leave a site for infection.  Also, freshly shaved skin may become irritated if using the  CHG soap.  Contact lenses, hearing aids and dentures may not be worn into surgery.  Do not bring valuables to the hospital. [facility] is not responsible for any missing/lost belongings or valuables.   Notify your doctor if there is any change in your medical condition (cold, fever, infection).  Wear comfortable clothing (specific to your surgery type) to the hospital.  If you are being discharged the day of surgery, you will not be allowed to drive home. You will need a responsible adult (18 years or older) to drive you home and stay with you that night.   If you are taking public transportation, you will need to have a responsible adult (18 years or older) with you. Please confirm with your physician that it is acceptable to use public transportation.   Please call the [facility] [place] Dept. at [phone] if you have any questions about these instructions.  Surgery Visitation Policy:  Patients undergoing a surgery or procedure may have one family member or support person with them as long as that person is not COVID-19 positive or experiencing its symptoms.  That person may remain in the waiting area during the procedure and may rotate out with other people.

## 2021-05-30 ENCOUNTER — Ambulatory Visit
Admission: RE | Admit: 2021-05-30 | Discharge: 2021-05-30 | Disposition: A | Discharge status: Home / Self Care | Payer: No Typology Code available for payment source | Source: Ambulatory Visit | Attending: General Surgery | Admitting: General Surgery

## 2021-05-30 ENCOUNTER — Encounter
Admission: RE | Disposition: A | Discharge status: Home / Self Care | Payer: Self-pay | Source: Ambulatory Visit | Attending: General Surgery

## 2021-05-30 ENCOUNTER — Ambulatory Visit: Payer: No Typology Code available for payment source | Admitting: Registered Nurse

## 2021-05-30 ENCOUNTER — Encounter: Payer: Self-pay | Admitting: General Surgery

## 2021-05-30 DIAGNOSIS — N6489 Other specified disorders of breast: Secondary | ICD-10-CM | POA: Insufficient documentation

## 2021-05-30 DIAGNOSIS — Z87891 Personal history of nicotine dependence: Secondary | ICD-10-CM | POA: Insufficient documentation

## 2021-05-30 DIAGNOSIS — Z79899 Other long term (current) drug therapy: Secondary | ICD-10-CM | POA: Diagnosis not present

## 2021-05-30 DIAGNOSIS — N6320 Unspecified lump in the left breast, unspecified quadrant: Secondary | ICD-10-CM | POA: Diagnosis present

## 2021-05-30 DIAGNOSIS — N63 Unspecified lump in unspecified breast: Secondary | ICD-10-CM

## 2021-05-30 DIAGNOSIS — N6311 Unspecified lump in the right breast, upper outer quadrant: Secondary | ICD-10-CM

## 2021-05-30 HISTORY — PX: EXCISION OF BREAST BIOPSY: SHX5822

## 2021-05-30 SURGERY — EXCISION OF BREAST BIOPSY
Anesthesia: General | Site: Breast | Laterality: Left

## 2021-05-30 MED ORDER — OXYCODONE HCL 5 MG/5ML PO SOLN: 5.0000 mg | Freq: Once | ORAL | Status: DC | PRN

## 2021-05-30 MED ORDER — ACETAMINOPHEN 10 MG/ML IV SOLN
INTRAVENOUS | Status: AC
  Filled 2021-05-30: qty 100

## 2021-05-30 MED ORDER — ORAL CARE MOUTH RINSE: 15.0000 mL | Freq: Once | OROMUCOSAL | Status: AC

## 2021-05-30 MED ORDER — CHLORHEXIDINE GLUCONATE 0.12 % MT SOLN: 15.0000 mL | Freq: Once | OROMUCOSAL | Status: AC

## 2021-05-30 MED ORDER — CEFAZOLIN SODIUM-DEXTROSE 2-4 GM/100ML-% IV SOLN
2.0000 g | INTRAVENOUS | Status: AC
  Administered 2021-05-30: 2 g via INTRAVENOUS

## 2021-05-30 MED ORDER — OXYCODONE HCL 5 MG PO TABS: 5.0000 mg | ORAL_TABLET | Freq: Once | ORAL | Status: DC | PRN

## 2021-05-30 MED ORDER — PROPOFOL 10 MG/ML IV BOLUS
INTRAVENOUS | Status: AC
  Filled 2021-05-30: qty 20

## 2021-05-30 MED ORDER — ONDANSETRON HCL 4 MG/2ML IJ SOLN
INTRAMUSCULAR | Status: AC
  Filled 2021-05-30: qty 6

## 2021-05-30 MED ORDER — FENTANYL CITRATE (PF) 100 MCG/2ML IJ SOLN
INTRAMUSCULAR | Status: DC | PRN
  Administered 2021-05-30: 50 ug via INTRAVENOUS
  Administered 2021-05-30: 25 ug via INTRAVENOUS

## 2021-05-30 MED ORDER — KETAMINE HCL 50 MG/5ML IJ SOSY
PREFILLED_SYRINGE | INTRAMUSCULAR | Status: AC
  Filled 2021-05-30: qty 5

## 2021-05-30 MED ORDER — CHLORHEXIDINE GLUCONATE 0.12 % MT SOLN
OROMUCOSAL | Status: AC
  Administered 2021-05-30: 15 mL via OROMUCOSAL
  Filled 2021-05-30: qty 15

## 2021-05-30 MED ORDER — DEXMEDETOMIDINE (PRECEDEX) IN NS 20 MCG/5ML (4 MCG/ML) IV SYRINGE
PREFILLED_SYRINGE | INTRAVENOUS | Status: DC | PRN
  Administered 2021-05-30: 4 ug via INTRAVENOUS
  Administered 2021-05-30 (×2): 8 ug via INTRAVENOUS

## 2021-05-30 MED ORDER — PROPOFOL 500 MG/50ML IV EMUL
INTRAVENOUS | Status: DC | PRN
  Administered 2021-05-30: 150 ug/kg/min via INTRAVENOUS

## 2021-05-30 MED ORDER — PROMETHAZINE HCL 25 MG/ML IJ SOLN
6.2500 mg | INTRAMUSCULAR | Status: DC | PRN
Start: 2021-05-30 — End: 2021-05-30

## 2021-05-30 MED ORDER — FENTANYL CITRATE (PF) 100 MCG/2ML IJ SOLN: 25.0000 ug | INTRAMUSCULAR | Status: DC | PRN

## 2021-05-30 MED ORDER — BUPIVACAINE-EPINEPHRINE (PF) 0.5% -1:200000 IJ SOLN
INTRAMUSCULAR | Status: AC
  Filled 2021-05-30: qty 30

## 2021-05-30 MED ORDER — LACTATED RINGERS IV SOLN: INTRAVENOUS | Status: DC

## 2021-05-30 MED ORDER — KETOROLAC TROMETHAMINE 30 MG/ML IJ SOLN
INTRAMUSCULAR | Status: DC | PRN
  Administered 2021-05-30: 30 mg via INTRAVENOUS

## 2021-05-30 MED ORDER — LIDOCAINE HCL (CARDIAC) PF 100 MG/5ML IV SOSY
PREFILLED_SYRINGE | INTRAVENOUS | Status: DC | PRN
  Administered 2021-05-30: 100 mg via INTRAVENOUS

## 2021-05-30 MED ORDER — DROPERIDOL 2.5 MG/ML IJ SOLN
0.6250 mg | Freq: Once | INTRAMUSCULAR | Status: DC | PRN
  Filled 2021-05-30: qty 0.25

## 2021-05-30 MED ORDER — DEXMEDETOMIDINE (PRECEDEX) IN NS 20 MCG/5ML (4 MCG/ML) IV SYRINGE
PREFILLED_SYRINGE | INTRAVENOUS | Status: AC
  Filled 2021-05-30: qty 5

## 2021-05-30 MED ORDER — PHENYLEPHRINE HCL-NACL 20-0.9 MG/250ML-% IV SOLN
INTRAVENOUS | Status: AC
  Filled 2021-05-30: qty 250

## 2021-05-30 MED ORDER — DIPHENHYDRAMINE HCL 50 MG/ML IJ SOLN
INTRAMUSCULAR | Status: DC | PRN
  Administered 2021-05-30: 25 mg via INTRAVENOUS

## 2021-05-30 MED ORDER — MIDAZOLAM HCL 2 MG/2ML IJ SOLN
INTRAMUSCULAR | Status: AC
  Filled 2021-05-30: qty 2

## 2021-05-30 MED ORDER — LIDOCAINE HCL (PF) 2 % IJ SOLN
INTRAMUSCULAR | Status: AC
  Filled 2021-05-30: qty 15

## 2021-05-30 MED ORDER — FAMOTIDINE 20 MG PO TABS: 20.0000 mg | ORAL_TABLET | Freq: Once | ORAL | Status: AC

## 2021-05-30 MED ORDER — PROPOFOL 1000 MG/100ML IV EMUL
INTRAVENOUS | Status: AC
  Filled 2021-05-30: qty 200

## 2021-05-30 MED ORDER — DEXAMETHASONE SODIUM PHOSPHATE 10 MG/ML IJ SOLN
INTRAMUSCULAR | Status: DC | PRN
  Administered 2021-05-30: 6 mg via INTRAVENOUS

## 2021-05-30 MED ORDER — FAMOTIDINE 20 MG PO TABS
ORAL_TABLET | ORAL | Status: AC
  Administered 2021-05-30: 20 mg via ORAL
  Filled 2021-05-30: qty 1

## 2021-05-30 MED ORDER — ONDANSETRON HCL 4 MG/2ML IJ SOLN
INTRAMUSCULAR | Status: DC | PRN
  Administered 2021-05-30: 4 mg via INTRAVENOUS

## 2021-05-30 MED ORDER — MIDAZOLAM HCL 2 MG/2ML IJ SOLN
INTRAMUSCULAR | Status: DC | PRN
  Administered 2021-05-30: 2 mg via INTRAVENOUS

## 2021-05-30 MED ORDER — CEFAZOLIN SODIUM-DEXTROSE 2-4 GM/100ML-% IV SOLN
INTRAVENOUS | Status: AC
  Filled 2021-05-30: qty 100

## 2021-05-30 MED ORDER — ACETAMINOPHEN 10 MG/ML IV SOLN: 1000.0000 mg | Freq: Once | INTRAVENOUS | Status: DC | PRN

## 2021-05-30 MED ORDER — DEXAMETHASONE SODIUM PHOSPHATE 10 MG/ML IJ SOLN
INTRAMUSCULAR | Status: AC
  Filled 2021-05-30: qty 3

## 2021-05-30 MED ORDER — KETOROLAC TROMETHAMINE 30 MG/ML IJ SOLN
INTRAMUSCULAR | Status: AC
  Filled 2021-05-30: qty 1

## 2021-05-30 MED ORDER — PROPOFOL 10 MG/ML IV BOLUS
INTRAVENOUS | Status: DC | PRN
  Administered 2021-05-30: 140 mg via INTRAVENOUS
  Administered 2021-05-30: 40 mg via INTRAVENOUS

## 2021-05-30 MED ORDER — ACETAMINOPHEN 10 MG/ML IV SOLN
INTRAVENOUS | Status: DC | PRN
  Administered 2021-05-30: 1000 mg via INTRAVENOUS

## 2021-05-30 MED ORDER — HYDROCODONE-ACETAMINOPHEN 5-325 MG PO TABS: 1.0000 | ORAL_TABLET | ORAL | 0 refills | Status: AC | PRN

## 2021-05-30 MED ORDER — FENTANYL CITRATE (PF) 100 MCG/2ML IJ SOLN
INTRAMUSCULAR | Status: AC
  Filled 2021-05-30: qty 2

## 2021-05-30 MED ORDER — BUPIVACAINE-EPINEPHRINE (PF) 0.5% -1:200000 IJ SOLN
INTRAMUSCULAR | Status: DC | PRN
  Administered 2021-05-30: 30 mL

## 2021-05-30 MED ORDER — DIPHENHYDRAMINE HCL 50 MG/ML IJ SOLN
INTRAMUSCULAR | Status: AC
  Filled 2021-05-30: qty 1

## 2021-05-30 MED ORDER — KETAMINE HCL 10 MG/ML IJ SOLN
INTRAMUSCULAR | Status: DC | PRN
  Administered 2021-05-30: 20 mg via INTRAVENOUS
  Administered 2021-05-30: 10 mg via INTRAVENOUS

## 2021-05-30 SURGICAL SUPPLY — 45 items
ADH SKN CLS APL DERMABOND .7 (GAUZE/BANDAGES/DRESSINGS) ×1
APL PRP STRL LF DISP 70% ISPRP (MISCELLANEOUS) ×1
BLADE SURG 15 STRL LF DISP TIS (BLADE) ×1 IMPLANT
BLADE SURG 15 STRL SS (BLADE) ×2
CHLORAPREP W/TINT 26 (MISCELLANEOUS) ×2 IMPLANT
CNTNR SPEC 2.5X3XGRAD LEK (MISCELLANEOUS)
CONT SPEC 4OZ STER OR WHT (MISCELLANEOUS)
CONT SPEC 4OZ STRL OR WHT (MISCELLANEOUS)
CONTAINER SPEC 2.5X3XGRAD LEK (MISCELLANEOUS) IMPLANT
DERMABOND ADVANCED (GAUZE/BANDAGES/DRESSINGS) ×1
DERMABOND ADVANCED .7 DNX12 (GAUZE/BANDAGES/DRESSINGS) ×1 IMPLANT
DEVICE DUBIN SPECIMEN MAMMOGRA (MISCELLANEOUS) ×2 IMPLANT
DRAPE LAPAROTOMY TRNSV 106X77 (MISCELLANEOUS) ×2 IMPLANT
ELECT CAUTERY BLADE TIP 2.5 (TIP) ×2
ELECT REM PT RETURN 9FT ADLT (ELECTROSURGICAL) ×2
ELECTRODE CAUTERY BLDE TIP 2.5 (TIP) ×1 IMPLANT
ELECTRODE REM PT RTRN 9FT ADLT (ELECTROSURGICAL) ×1 IMPLANT
GAUZE 4X4 16PLY ~~LOC~~+RFID DBL (SPONGE) ×2 IMPLANT
GLOVE SURG ENC MOIS LTX SZ6.5 (GLOVE) ×4 IMPLANT
GLOVE SURG UNDER POLY LF SZ6.5 (GLOVE) ×4 IMPLANT
GOWN STRL REUS W/ TWL LRG LVL3 (GOWN DISPOSABLE) ×3 IMPLANT
GOWN STRL REUS W/TWL LRG LVL3 (GOWN DISPOSABLE) ×6
KIT MARKER MARGIN INK (KITS) IMPLANT
KIT TURNOVER KIT A (KITS) ×2 IMPLANT
LABEL OR SOLS (LABEL) ×2 IMPLANT
MANIFOLD NEPTUNE II (INSTRUMENTS) ×2 IMPLANT
MARGIN MAP 10MM (MISCELLANEOUS) ×2 IMPLANT
MARKER MARGIN CORRECT CLIP (MARKER) IMPLANT
NEEDLE HYPO 25X1 1.5 SAFETY (NEEDLE) ×2 IMPLANT
PACK BASIN MINOR ARMC (MISCELLANEOUS) ×2 IMPLANT
RETRACTOR RING XSMALL (MISCELLANEOUS) IMPLANT
RTRCTR WOUND ALEXIS 13CM XS SH (MISCELLANEOUS)
SUT ETHILON 3-0 FS-10 30 BLK (SUTURE) ×2
SUT MNCRL 4-0 (SUTURE) ×2
SUT MNCRL 4-0 27XMFL (SUTURE) ×1
SUT SILK 2 0 SH (SUTURE) IMPLANT
SUT VIC AB 3-0 SH 27 (SUTURE) ×2
SUT VIC AB 3-0 SH 27X BRD (SUTURE) ×1 IMPLANT
SUTURE EHLN 3-0 FS-10 30 BLK (SUTURE) ×1 IMPLANT
SUTURE MNCRL 4-0 27XMF (SUTURE) ×1 IMPLANT
SYR 10ML LL (SYRINGE) ×2 IMPLANT
SYR BULB IRRIG 60ML STRL (SYRINGE) ×2 IMPLANT
TRAP NEPTUNE SPECIMEN COLLECT (MISCELLANEOUS) ×2 IMPLANT
WATER STERILE IRR 1000ML POUR (IV SOLUTION) ×2 IMPLANT
WATER STERILE IRR 500ML POUR (IV SOLUTION) IMPLANT

## 2021-05-30 NOTE — Discharge Instructions (Addendum)
  Diet: Resume home heart healthy regular diet.   Activity: Increase activity as tolerated. Light activity and walking are encouraged. Do not drive or drink alcohol if taking narcotic pain medications.  Wound care: May shower with soapy water and pat dry (do not rub incisions), but no baths or submerging incision underwater until follow-up. (no swimming)   Medications: Resume all home medications. For mild to moderate pain: acetaminophen (Tylenol) or ibuprofen (if no kidney disease). Combining Tylenol with alcohol can substantially increase your risk of causing liver disease. Narcotic pain medications, if prescribed, can be used for severe pain, though may cause nausea, constipation, and drowsiness. Do not combine Tylenol and Norco within a 6 hour period as Norco contains Tylenol. If you do not need the narcotic pain medication, you do not need to fill the prescription.  Call office ([phone]) at any time if any questions, worsening pain, fevers/chills, bleeding, drainage from incision site, or other concerns.   AMBULATORY SURGERY  DISCHARGE INSTRUCTIONS   The drugs that you were given will stay in your system until tomorrow so for the next 24 hours you should not:  Drive an automobile Make any legal decisions Drink any alcoholic beverage   You may resume regular meals tomorrow.  Today it is better to start with liquids and gradually work up to solid foods.  You may eat anything you prefer, but it is better to start with liquids, then soup and crackers, and gradually work up to solid foods.   Please notify your doctor immediately if you have any unusual bleeding, trouble breathing, redness and pain at the surgery site, drainage, fever, or pain not relieved by medication.    Additional Instructions:        Please contact your physician with any problems or Same Day Surgery at [phone], Monday through Friday 6 am to 4 pm, or [place] at [place] Main number at  [phone]. 

## 2021-05-30 NOTE — Anesthesia Postprocedure Evaluation (Signed)
Anesthesia Post Note  Patient: Diane Roberts  Procedure(s) Performed: EXCISION OF BREAST BIOPSY (Left: Breast)  Patient location during evaluation: PACU Anesthesia Type: General Level of consciousness: awake and alert Pain management: pain level controlled Vital Signs Assessment: post-procedure vital signs reviewed and stable Respiratory status: spontaneous breathing, nonlabored ventilation and respiratory function stable Cardiovascular status: blood pressure returned to baseline and stable Postop Assessment: no apparent nausea or vomiting Anesthetic complications: no   No notable events documented.   Last Vitals:  Vitals:   05/30/21 1028 05/30/21 1037  BP: 91/61 104/68  Pulse: (!) 52 74  Resp: 18 18  Temp: (!) 36.3 C (!) 36.2 C  SpO2: 100% 100%    Last Pain:  Vitals:   05/30/21 1037  TempSrc: Temporal  PainSc: 0-No pain                 Iran Ouch

## 2021-05-30 NOTE — Anesthesia Preprocedure Evaluation (Addendum)
Anesthesia Evaluation  Patient identified by MRN, date of birth, ID band Patient awake    Reviewed: Allergy & Precautions, H&P , NPO status , Patient's Chart, lab work & pertinent test results  History of Anesthesia Complications (+) PONV and history of anesthetic complications (Extreme nausea in PACU after hysterectomy. None with prior surgeries.)  Airway Mallampati: II  TM Distance: >3 FB Neck ROM: full    Dental no notable dental hx.    Pulmonary neg shortness of breath, Current Smoker and Patient abstained from smoking.,    Pulmonary exam normal        Cardiovascular (-) angina(-) Past MI and (-) DOE negative cardio ROS Normal cardiovascular exam     Neuro/Psych  Headaches (rare), Seizures - (age 40 x1),  Adjustment disorder with anxious moodnegative psych ROS   GI/Hepatic negative GI ROS, Neg liver ROS, neg GERD  ,  Endo/Other  negative endocrine ROSThyroid nodule  Renal/GU      Musculoskeletal   Abdominal Normal abdominal exam  (+)   Peds  Hematology negative hematology ROS (+)   Anesthesia Other Findings Pseudoangiomatous stromal hyperplasia of breast     Reproductive/Obstetrics negative OB ROS                            Anesthesia Physical  Anesthesia Plan  ASA: 2  Anesthesia Plan: General   Post-op Pain Management:    Induction: Intravenous  PONV Risk Score and Plan: 4 or greater and Ondansetron, Dexamethasone, Midazolam, Treatment may vary due to age or medical condition and TIVA  Airway Management Planned: LMA  Additional Equipment:   Intra-op Plan:   Post-operative Plan: Extubation in OR  Informed Consent:   Plan Discussed with: Anesthesiologist, CRNA and Surgeon  Anesthesia Plan Comments: (Patient consented for risks of anesthesia including but not limited to:  - adverse reactions to medications - damage to eyes, teeth, lips or other oral mucosa - nerve  damage due to positioning  - sore throat or hoarseness - Damage to heart, brain, nerves, lungs, other parts of body or loss of life  Patient voiced understanding.)       Anesthesia Quick Evaluation

## 2021-05-30 NOTE — Op Note (Addendum)
Preoperative diagnosis: Left breast mass.  Postoperative diagnosis: Left breast mass.  Procedure: Left breast excisional biopsy.                      Anesthesia: GETA  Surgeon: Dr. Windell Moment  Wound Classification: Clean  Indications:  Patient is a 40 y.o. female with a palpable left breast mass underwent workup with ultrasound, diagnostic mammogram and Ultrasound guided biopsy. These showed [place] and increased to 7 cm in 2 years. Excision was indicated due to size and symptoms   Findings: 1. Palpable mass at 2 o clock position 2. Markers seen on specimen mammogram.   Description of procedure: The patient was taken to the operating room and placed supine on the operating table, and after general anesthesia was administered, the left chest and axilla were prepped and draped in the usual sterile fashion. A time-out was completed verifying correct patient, procedure, site, positioning, and implant(s) and/or special equipment prior to beginning this procedure.  A circumareolar skin incision incision was planned adjacent to the palpable mass. Local anesthesia was infiltrated and a skin incision was made. Flaps were raised and the location of the mass confirmed by palpation.  A 2-0 silk figure-of-eight stay suture was placed in the mass and used to retract.  The mass was dissected from surrounding tissues using electrocautery. After removing the lump, the cavity was palpated and no additional abnormalities was palpated. The specimen was oriented. Specimen mammogram showed biopsy clips confirming that desired mass was removed and submitted to pathology.  Hemostasis was achieved with electrocautery and suture ligatures of 3-0 Vicryl.  The breast wound was then approached for closure.  Eliminate dead space a tissue transfer technique was utilized.  The breast and pectoralis fascia was elevated off the underlying muscle and the serratus muscle circumferentially for a distance of about 5 centimeters (25  sq cm).  The fascial layer was then approximated with interrupted 2-0 Vicryl sutures.  The superficial layer of the breast parenchyma was then approximated in a similar fashion.  This was done in a radial direction.  The skin flaps were then elevated circumferentially to remove a ripple noted superiorly and medially. The skin was closed with 4-0 Monocryl. Dermabond was applied.  Specimen: Left breast mass  Complications: None  Estimated Blood Loss: 5 mL

## 2021-05-30 NOTE — Interval H&P Note (Signed)
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  The various methods of treatment have been discussed with the patient and family. After consideration of risks, benefits and other options for treatment, the patient has consented to  Procedure(s): EXCISION OF BREAST BIOPSY (Left) as a surgical intervention.  The patient's history has been reviewed, patient examined, no change in status, stable for surgery.  I have reviewed the patient's chart and labs.  Questions were answered to the patient's satisfaction.     Herbert Pun

## 2021-05-30 NOTE — Transfer of Care (Signed)
Immediate Anesthesia Transfer of Care Note  Patient: Diane Roberts  Procedure(s) Performed: EXCISION OF BREAST BIOPSY (Left: Breast)  Patient Location: PACU  Anesthesia Type:General  Level of Consciousness: drowsy  Airway & Oxygen Therapy: Patient Spontanous Breathing and Patient connected to face mask oxygen  Post-op Assessment: Report given to RN and Post -op Vital signs reviewed and stable  Post vital signs: Reviewed and stable  Last Vitals:  Vitals Value Taken Time  BP 97/55 05/30/21 0954  Temp 36.3 C 05/30/21 0954  Pulse 63 05/30/21 0957  Resp 14 05/30/21 0957  SpO2 100 % 05/30/21 0957  Vitals shown include unvalidated device data.  Last Pain:  Vitals:   05/30/21 0718  TempSrc: Temporal  PainSc: 0-No pain         Complications: No notable events documented.

## 2021-05-30 NOTE — Anesthesia Procedure Notes (Signed)
Procedure Name: LMA Insertion Date/Time: 05/30/2021 8:45 AM Performed by: Lia Foyer, CRNA Pre-anesthesia Checklist: Patient identified, Emergency Drugs available, Suction available and Patient being monitored Patient Re-evaluated:Patient Re-evaluated prior to induction Oxygen Delivery Method: Circle system utilized Preoxygenation: Pre-oxygenation with 100% oxygen Induction Type: IV induction Ventilation: Mask ventilation without difficulty LMA: LMA flexible inserted LMA Size: 3.5 Tube type: Oral Number of attempts: 1 Placement Confirmation: positive ETCO2 and breath sounds checked- equal and bilateral Tube secured with: Tape Dental Injury: Teeth and Oropharynx as per pre-operative assessment

## 2021-05-31 LAB — SURGICAL PATHOLOGY

## 2021-06-18 ENCOUNTER — Encounter: Payer: Self-pay | Admitting: Family Medicine

## 2021-06-18 ENCOUNTER — Telehealth: Payer: Self-pay

## 2021-06-18 NOTE — Telephone Encounter (Signed)
Copied from [place] [id number]. Topic: Appointment Scheduling - Scheduling Inquiry for Clinic >> Jun 18, 2021  3:53 PM Valere Dross wrote: Reason for CRM: Pt called in stating she believes she might have a UTI and was advise when talking with PCP to make an appt, but no appts are available and pt wants to see if she can get squeezed in sooner, please advise.

## 2021-06-18 NOTE — Telephone Encounter (Signed)
FYI-Patient called if she can be seen.Patient had message earlier today-(my chart message). No more appointments available today or tomorrow. Per patient she is going to go to the UC/ED. She was hoping that since is really late to have an antibiotic send in. Stated that her aunt and mom see other provider in the clinic and that they do that for her. Told patient that I will check with provider to see if she can see her tomorrow morning per patient she gets off until, that she will see what she can do if she goes to the UC or ED.

## 2021-06-19 NOTE — Telephone Encounter (Signed)
Patient stated yesterday that she was going to go to the UC.

## 2021-06-19 NOTE — Telephone Encounter (Signed)
Could double book her into the 920 this morning for just UTI symptoms. Agree that she needs to be evaluated, not just abx called in.

## 2021-08-29 ENCOUNTER — Encounter: Payer: Self-pay | Admitting: Family Medicine

## 2021-08-29 ENCOUNTER — Ambulatory Visit: Payer: Self-pay | Admitting: Family Medicine

## 2021-08-29 ENCOUNTER — Other Ambulatory Visit: Payer: Self-pay

## 2021-08-29 DIAGNOSIS — Z113 Encounter for screening for infections with a predominantly sexual mode of transmission: Secondary | ICD-10-CM

## 2021-08-29 LAB — HM HEPATITIS C SCREENING LAB: HM Hepatitis Screen: NEGATIVE

## 2021-08-29 LAB — HEPATITIS B SURFACE ANTIGEN: Hepatitis B Surface Ag: NONREACTIVE

## 2021-08-29 LAB — HM HIV SCREENING LAB: HM HIV Screening: NEGATIVE

## 2021-08-29 NOTE — Progress Notes (Signed)
Pt here for STD screening.  Pt given condoms and sent to lab.  Windle Guard, RN

## 2021-08-29 NOTE — Progress Notes (Signed)
[facility] Department  STI clinic/screening visit [place] [address] [phone]  Subjective:  Diane Roberts is a 41 y.o. female being seen today for an STI screening visit. The patient reports they do not have symptoms.  Patient reports that they do not desire a pregnancy in the next year.   They reported they are not interested in discussing contraception today.    No LMP recorded (lmp unknown). Patient has had a hysterectomy.   Patient has the following medical conditions:   Patient Active Problem List   Diagnosis Date Noted   Thyroid nodule 06/22/2020   Chest pain of uncertain etiology 35/36/1443   Lightheadedness 05/17/2020   Palpitations 05/17/2020   Dysplasia of cervix, low grade (CIN 1) 03/21/2020   Adjustment disorder with anxious mood 01/20/2020   Anal fissure 01/20/2020   History of anal fissures 08/30/2019   Chronic pain of left knee 08/30/2019   Synovial cyst of left popliteal space 08/30/2019   Pelvic pain 04/21/2018   Uterine leiomyoma 04/21/2018   Leukorrhea 04/21/2018   Frequent headaches 09/09/2013    Chief Complaint  Patient presents with   SEXUALLY TRANSMITTED DISEASE    screening    HPI  Patient reports she is here for STD screening.  She denies symptoms.  States that her previous partner used IV drugs-found this out in 2020.  States she was tested in 2019/2020-negative results.     Last HIV test per patient/review of record was 2019 Patient reports last pap was 05/2021 .   Screening for MPX risk: Does the patient have an unexplained rash? No Is the patient MSM? No Does the patient endorse multiple sex partners or anonymous sex partners? No Did the patient have close or sexual contact with a person diagnosed with MPX? No Has the patient traveled outside the [place] where MPX is endemic? No Is there a high clinical suspicion for MPX-- evidenced by one of the following No  -Unlikely to be  chickenpox  -Lymphadenopathy  -Rash that present in same phase of evolution on any given body part See flowsheet for further details and programmatic requirements.    The following portions of the patient's history were reviewed and updated as appropriate: allergies, current medications, past medical history, past social history, past surgical history and problem list.  Objective:  There were no vitals filed for this visit.  Physical Exam Vitals and nursing note reviewed.  Constitutional:      Appearance: Normal appearance.  HENT:     Head: Normocephalic and atraumatic.     Mouth/Throat:     Mouth: Mucous membranes are moist.     Pharynx: Oropharynx is clear. No oropharyngeal exudate or posterior oropharyngeal erythema.  Abdominal:     General: Abdomen is flat.     Palpations: There is no mass.     Tenderness: There is no abdominal tenderness. There is no rebound.  Genitourinary:    Comments: Client declined pelvic exam. Self collected GC/Chlamydia Lymphadenopathy:     Head:     Right side of head: No preauricular or posterior auricular adenopathy.     Left side of head: No preauricular or posterior auricular adenopathy.     Cervical: No cervical adenopathy.     Upper Body:     Right upper body: No supraclavicular or axillary adenopathy.     Left upper body: No supraclavicular or axillary adenopathy.  Skin:    General: Skin is warm and dry.  Findings: No rash.  Neurological:     Mental Status: She is alert and oriented to person, place, and time.     Assessment and Plan:  Diane Roberts is a 41 y.o. female presenting to the [facility] Department for STI screening  1. Screening examination for venereal disease  - HIV/HCV [facility] [place] [facility] - HBV Antigen/Antibody [facility] - Chlamydia/Gonorrhea [place] [facility] - Syphilis Serology,  Lab Client will be notified if results are positive.   Return if symptoms worsen or fail to improve.  Future  Appointments  Date Time Provider [place]  09/25/2021 10:40 AM Bacigalupo, Dionne Bucy, MD [facility], FNP

## 2021-09-25 ENCOUNTER — Encounter: Payer: No Typology Code available for payment source | Admitting: Family Medicine

## 2021-09-28 ENCOUNTER — Encounter: Payer: Self-pay | Admitting: Family Medicine

## 2021-10-01 MED ORDER — LIDOCAINE (ANORECTAL) 5 % EX GEL: CUTANEOUS | 1 refills | Status: DC

## 2021-11-14 ENCOUNTER — Ambulatory Visit: Payer: Self-pay | Admitting: *Deleted

## 2021-11-14 ENCOUNTER — Encounter: Payer: Self-pay | Admitting: Family Medicine

## 2021-11-14 NOTE — Telephone Encounter (Signed)
?  Chief Complaint: exposed to STI?? ?Symptoms: burning, itching ?Frequency: constant ?Pertinent Negatives: Patient denies na ?Disposition: '[]'$ ED /'[]'$ Urgent Care (no appt availability in office) / '[x]'$ Appointment(In office/virtual)/ '[]'$  [place] Virtual Care/ '[]'$ Home Care/ '[]'$ Refused Recommended Disposition /'[]'$ [place] [facility]/ '[]'$  Follow-up with PCP ?Additional Notes: Appointment made for tomorrow in office. ? ?Reason for Disposition ? Patient is worried they have a sexually transmitted infection (STI) ? ?Answer Assessment - Initial Assessment Questions ?1. MAIN CONCERN: "What were you exposed to?"  "What sexually transmitted infection (STI) does your sex partner have?" (e.g., gonorrhea, herpes, HIV, pubic lice) ?    unkknown ?2. ROUTE of EXPOSURE: "How were you exposed to the STI?" (e.g., oral, vaginal, or rectal intercourse) ?    vaginal ?3. DATE of EXPOSURE: "When did the exposure occur?" (e.g., days) ?    4 days ?4. SYMPTOMS: "Do you have any symptoms?" (e.g., pain with urination, rash, sores) ?    discomfort ?5. PREGNANCY: "Is there any chance you are pregnant?" "When was your last menstrual period?" ?    No, hysterectomy ? ?Protocols used: STI Exposure-A-AH ? ?

## 2021-11-15 ENCOUNTER — Ambulatory Visit: Payer: No Typology Code available for payment source | Admitting: Family Medicine

## 2021-11-15 ENCOUNTER — Encounter: Payer: Self-pay | Admitting: Family Medicine

## 2021-11-15 VITALS — BP 115/66 | HR 70 | Temp 98.1°F | Resp 16 | Wt 145.2 lb

## 2021-11-15 DIAGNOSIS — N898 Other specified noninflammatory disorders of vagina: Secondary | ICD-10-CM

## 2021-11-15 NOTE — Assessment & Plan Note (Addendum)
New complaint, unk ?Has been with partner for 4 months; reports sex 5x/week ?Denies vaginal dryness ?Hx of hysterectomy ?Vaginal exam normal, minimal discharge present ?Pt unable to void for urine cytology ?Hx of yeast in 2021, non clumpy at this time, thin d/c ?No odor noted on exam ?Will send nuswab ?

## 2021-11-15 NOTE — Progress Notes (Addendum)
?  ? ?[employer] as a Education administrator for Gwyneth Sprout, FNP.,have documented all relevant documentation on the behalf of Gwyneth Sprout, FNP,as directed by  Gwyneth Sprout, FNP while in the presence of Gwyneth Sprout, FNP.  ? ?Established patient visit ? ? ?Patient: Diane Roberts   DOB: [birth year]   41 y.o. Female  MRN: [id number] ?Visit Date: 11/15/2021 ? ?Today's healthcare provider: Gwyneth Sprout, FNP  ? ?Introduced to Designer, jewellery role and practice setting.  All questions answered.  Discussed provider/patient relationship and expectations. ? ? ?Chief Complaint  ?Patient presents with  ? Exposure to STD  ? ?Subjective  ?  ?Exposure to STD  ?The patient's primary symptoms include a discharge. This is a new problem. The current episode started yesterday. The vaginal discharge was white. Associate symptoms include a genital odor. Pertinent negatives include no abdominal pain, anorexia, diaphoresis, fever, rectal pain, sore throat or urinary frequency. She has tried nothing for the symptoms.   ? ? ?Medications: ?Outpatient Medications Prior to Visit  ?Medication Sig  ? acetaminophen (TYLENOL) 325 MG tablet Take 650 mg by mouth as needed.  ? Lidocaine, Anorectal, 5 % GEL Apply topically BID prn for anal fissure  ? ?No facility-administered medications prior to visit.  ? ? ?Review of Systems  ?Constitutional:  Negative for diaphoresis and fever.  ?HENT:  Negative for sore throat.   ?Gastrointestinal:  Negative for abdominal pain, anorexia and rectal pain.  ?Genitourinary:  Negative for frequency.  ? ? ?  Objective  ?  ?BP 115/66   Pulse 70   Temp 98.1 ?F (36.7 ?C) (Temporal)   Resp 16   Wt 145 lb 3.2 oz (65.9 kg)   LMP  (LMP Unknown)   SpO2 98%   BMI 24.16 kg/m?  ? ? ?Physical Exam ?Vitals and nursing note reviewed. Exam conducted with a chaperone present.  ?Constitutional:   ?   General: She is not in acute distress. ?   Appearance: Normal appearance. She is normal weight. She is not  ill-appearing, toxic-appearing or diaphoretic.  ?HENT:  ?   Head: Normocephalic and atraumatic.  ?Cardiovascular:  ?   Rate and Rhythm: Normal rate and regular rhythm.  ?   Pulses: Normal pulses.  ?Pulmonary:  ?   Effort: Pulmonary effort is normal.  ?   Breath sounds: Normal breath sounds.  ?Abdominal:  ?   General: Bowel sounds are normal.  ?   Palpations: Abdomen is soft.  ?Genitourinary: ?   General: Normal vulva.  ?   Exam position: Lithotomy position.  ?   Tanner stage (genital): 5.  ?   Vagina: Vaginal discharge present.  ?Musculoskeletal:     ?   General: Normal range of motion.  ?Skin: ?   General: Skin is warm and dry.  ?   Capillary Refill: Capillary refill takes less than 2 seconds.  ?Neurological:  ?   General: No focal deficit present.  ?   Mental Status: She is alert and oriented to person, place, and time. Mental status is at baseline.  ?Psychiatric:     ?   Mood and Affect: Mood normal.     ?   Behavior: Behavior normal.     ?   Thought Content: Thought content normal.     ?   Judgment: Judgment normal.  ?  ? ?No results found for any visits on 11/15/21. ? Assessment & Plan  ?  ? ?Problem List Items Addressed This  Visit   ? ?  ? Other  ? Vaginal discharge - Primary  ?  New complaint, unk ?Has been with partner for 4 months; reports sex 5x/week ?Denies vaginal dryness ?Hx of hysterectomy ?Vaginal exam normal, minimal discharge present ?Pt unable to void for urine cytology ?Hx of yeast in 2021, non clumpy at this time, thin d/c ?No odor noted on exam ?Will send nuswab ?  ?  ? Relevant Orders  ? NuSwab Vaginitis Plus (VG+)  ? ? ? ?Return if symptoms worsen or fail to improve.  ?   ? ?I, Gwyneth Sprout, FNP, have reviewed all documentation for this visit. The documentation on 11/15/21 for the exam, diagnosis, procedures, and orders are all accurate and complete. ? ? ? ?Gwyneth Sprout, FNP  ?[facility] ?[phone] (phone) ?[phone] (fax) ? ?[facility] ?

## 2021-11-18 ENCOUNTER — Other Ambulatory Visit: Payer: Self-pay | Admitting: Family Medicine

## 2021-11-18 DIAGNOSIS — B3731 Acute candidiasis of vulva and vagina: Secondary | ICD-10-CM

## 2021-11-18 DIAGNOSIS — B9689 Other specified bacterial agents as the cause of diseases classified elsewhere: Secondary | ICD-10-CM

## 2021-11-18 LAB — NUSWAB VAGINITIS PLUS (VG+)
Atopobium vaginae: HIGH Score — AB
BVAB 2: HIGH Score — AB
Candida albicans, NAA: POSITIVE — AB
Candida glabrata, NAA: NEGATIVE
Chlamydia trachomatis, NAA: NEGATIVE
Megasphaera 1: HIGH Score — AB
Neisseria gonorrhoeae, NAA: NEGATIVE
Trich vag by NAA: NEGATIVE

## 2021-11-18 MED ORDER — FLUCONAZOLE 150 MG PO TABS: ORAL_TABLET | ORAL | 0 refills | Status: DC

## 2021-11-18 MED ORDER — METRONIDAZOLE 500 MG PO TABS: 500.0000 mg | ORAL_TABLET | Freq: Two times a day (BID) | ORAL | 0 refills | Status: AC

## 2022-02-25 ENCOUNTER — Encounter: Payer: No Typology Code available for payment source | Admitting: Family Medicine

## 2022-04-11 ENCOUNTER — Ambulatory Visit: Payer: Self-pay | Admitting: *Deleted

## 2022-04-11 NOTE — Telephone Encounter (Signed)
Summary: UTI   Pressure and frequent urination with only a little coming out / started yesterday / please advise / no appt for today / pt didn't want tomorrows appt       Chief Complaint: urinary retention Symptoms: voiding small ant freq Frequency: every 20 min Pertinent Negatives: Patient denies fever, flank pain Disposition: '[]'$ ED /'[x]'$ Urgent Care (no appt availability in office) / '[]'$ Appointment(In office/virtual)/ '[]'$  [place] [facility]/ '[]'$ Home Care/ '[]'$ Refused Recommended Disposition /'[]'$ [place] [place] [facility]/ '[]'$  Follow-up with PCP Additional Notes: Pt went to ED last night but there was an 8 hour weight so she left without being seen. No appts today in office. Pt educated on need to be taken care of today, can back up into kidneys and cause further problems. Pt states she will go to UC.  Reason for Disposition  [1] Unable to urinate (or only a few drops) > 4 hours AND [2] bladder feels very full (e.g., palpable bladder or strong urge to urinate)  Answer Assessment - Initial Assessment Questions 1. SYMPTOM: "What's the main symptom you're concerned about?" (e.g., frequency, incontinence)     Not be able to empty 2. ONSET: "When did the  not being able to void  start?"     yesterday 3. PAIN: "Is there any pain?" If Yes, ask: "How bad is it?" (Scale: 1-10; mild, moderate, severe)     Yes, pain is from being full, 7 4. CAUSE: "What do you think is causing the symptoms?"     Full bladder 5. OTHER SYMPTOMS: "Do you have any other symptoms?" (e.g., blood in urine, fever, flank pain, pain with urination)     No to all 6. PREGNANCY: "Is there any chance you are pregnant?" "When was your last menstrual period?"     no  Protocols used: Urinary Symptoms-A-AH

## 2022-04-19 ENCOUNTER — Encounter: Payer: Self-pay | Admitting: Family Medicine

## 2022-04-19 ENCOUNTER — Ambulatory Visit: Payer: No Typology Code available for payment source | Admitting: Family Medicine

## 2022-04-19 VITALS — BP 99/65 | HR 76 | Temp 98.2°F | Resp 16 | Ht 64.0 in | Wt 153.5 lb

## 2022-04-19 DIAGNOSIS — R3989 Other symptoms and signs involving the genitourinary system: Secondary | ICD-10-CM | POA: Diagnosis not present

## 2022-04-19 DIAGNOSIS — N390 Urinary tract infection, site not specified: Secondary | ICD-10-CM

## 2022-04-19 LAB — POCT URINALYSIS DIPSTICK
Bilirubin, UA: NEGATIVE
Blood, UA: NEGATIVE
Glucose, UA: NEGATIVE
Ketones, UA: NEGATIVE
Leukocytes, UA: NEGATIVE
Nitrite, UA: NEGATIVE
Protein, UA: NEGATIVE
Spec Grav, UA: <=1.005 — AB (ref 1.010–1.025)
Urobilinogen, UA: 0.2 E.U./dL
pH, UA: 7.5 (ref 5.0–8.0)

## 2022-04-19 MED ORDER — PHENAZOPYRIDINE HCL 100 MG PO TABS: 200.0000 mg | ORAL_TABLET | Freq: Three times a day (TID) | ORAL | 0 refills | Status: AC | PRN

## 2022-04-19 NOTE — Progress Notes (Signed)
Established patient visit  I,Joseline E Rosas,acting as a scribe for [employer], MD.,have documented all relevant documentation on the behalf of Eulis Foster, MD,as directed by  Eulis Foster, MD while in the presence of Eulis Foster, MD.   Patient: Diane Roberts   DOB: Apr 02, 1981   41 y.o. Female  MRN: [id number] Visit Date: 04/19/2022  Today's healthcare provider: Eulis Foster, MD   Chief Complaint  Patient presents with   Dysuria   Subjective    With every other void, she reports feeling a sense of bladder pressure and frequency without dysuria or pain.   Dysuria  This is a recurrent problem. The current episode started 1 to 4 weeks ago. The problem occurs intermittently. The problem has been unchanged. The patient is experiencing no pain. There has been no fever. Associated symptoms include urgency. Pertinent negatives include no chills, discharge, flank pain, frequency, hematuria or hesitancy. Associated symptoms comments: Pressure in bladder, feels like she can't empty her bladder all the way. Bladder spasm. She has tried antibiotics and increased fluids (urgent care prescribed antibiotic a week ago. Reports that she finished it on Monday.) for the symptoms. The treatment provided no relief. Her past medical history is significant for recurrent UTIs.     Medications: Outpatient Medications Prior to Visit  Medication Sig   acetaminophen (TYLENOL) 325 MG tablet Take 650 mg by mouth as needed.   Lidocaine, Anorectal, 5 % GEL Apply topically BID prn for anal fissure   [DISCONTINUED] fluconazole (DIFLUCAN) 150 MG tablet Take 1 tablet PO, repeat in 4 days if symptoms continue/remain.   No facility-administered medications prior to visit.    Review of Systems  Constitutional:  Negative for chills and fever.  Genitourinary:  Positive for dysuria and urgency. Negative for flank pain, frequency, hematuria and  hesitancy.       Bladder pressure   Musculoskeletal:  Negative for back pain.       Objective    BP 99/65 (BP Location: Left Arm, Patient Position: Sitting, Cuff Size: Normal)   Pulse 76   Temp 98.2 F (36.8 C) (Oral)   Resp 16   Ht '5\' 4"'$  (1.626 m)   Wt 153 lb 8 oz (69.6 kg)   LMP  (LMP Unknown)   BMI 26.35 kg/m    Physical Exam Constitutional:      General: She is not in acute distress.    Appearance: Normal appearance. She is not ill-appearing, toxic-appearing or diaphoretic.  Pulmonary:     Effort: Pulmonary effort is normal.  Abdominal:     General: Abdomen is flat. Bowel sounds are normal. There is no distension.     Palpations: Abdomen is soft. There is no mass.     Tenderness: There is no abdominal tenderness. There is no right CVA tenderness or left CVA tenderness.  Neurological:     Mental Status: She is alert.       Results for orders placed or performed in visit on 04/19/22  POCT urinalysis dipstick  Result Value Ref Range   Color, UA yellow    Clarity, UA clear    Glucose, UA Negative Negative   Bilirubin, UA Negative    Ketones, UA Negative    Spec Grav, UA <=1.005 (A) 1.010 - 1.025   Blood, UA Negative    pH, UA 7.5 5.0 - 8.0   Protein, UA Negative Negative   Urobilinogen, UA 0.2 0.2 or 1.0 E.U./dL   Nitrite, UA Negative  Leukocytes, UA Negative Negative    Assessment & Plan     Problem List Items Addressed This Visit       Genitourinary   Recurrent UTI    New problem Reports 2 UTIs this year  S/p treatment with bactrim, no systemic symptoms today  U/A in office negative for nitrites/leukocytes  Will send for culture  Discussed possible residual inflammation from recent infection  Will treat with pyridium over the weekend and submit referral to urology  Patient scheduled to follow up in 2-3 weeks       Relevant Medications   phenazopyridine (PYRIDIUM) 100 MG tablet   Other Relevant Orders   POCT urinalysis dipstick (Completed)    Urine Culture   Ambulatory referral to Urology     Other   Sensation of pressure in bladder area - Primary   Relevant Orders   Ambulatory referral to Urology     No follow-ups on file.      The entirety of the information documented in the History of Present Illness, Review of Systems and Physical Exam were personally obtained by me, Jaicey Sweaney Simmons-Robinson. Portions of this information were initially documented by the CMA and reviewed by me for thoroughness and accuracy.     Eulis Foster, MD  [facility] [phone] (phone) [phone] (fax)  [place]

## 2022-04-19 NOTE — Assessment & Plan Note (Addendum)
New problem Reports 2 UTIs this year  S/p treatment with bactrim, no systemic symptoms today  U/A in office negative for nitrites/leukocytes  Will send for culture  Discussed possible residual inflammation from recent infection  Will treat with pyridium over the weekend and submit referral to urology  Patient scheduled to follow up in 2-3 weeks

## 2022-04-22 LAB — URINE CULTURE

## 2022-05-03 ENCOUNTER — Ambulatory Visit: Payer: No Typology Code available for payment source | Admitting: Family Medicine

## 2022-09-02 ENCOUNTER — Emergency Department
Admission: EM | Admit: 2022-09-02 | Discharge: 2022-09-02 | Disposition: A | Discharge status: Home / Self Care | Payer: No Typology Code available for payment source | Attending: Emergency Medicine | Admitting: Emergency Medicine

## 2022-09-02 ENCOUNTER — Encounter: Payer: Self-pay | Admitting: Emergency Medicine

## 2022-09-02 ENCOUNTER — Other Ambulatory Visit: Payer: Self-pay

## 2022-09-02 ENCOUNTER — Emergency Department: Payer: No Typology Code available for payment source

## 2022-09-02 DIAGNOSIS — S4991XA Unspecified injury of right shoulder and upper arm, initial encounter: Secondary | ICD-10-CM | POA: Diagnosis not present

## 2022-09-02 DIAGNOSIS — W010XXA Fall on same level from slipping, tripping and stumbling without subsequent striking against object, initial encounter: Secondary | ICD-10-CM | POA: Diagnosis not present

## 2022-09-02 NOTE — ED Provider Notes (Signed)
[facility] Provider Note    Event Date/Time   First MD Initiated Contact with Patient 09/02/22 1051     (approximate)   History   Shoulder Injury   HPI  Diane Roberts is a 42 y.o. female who presents today for evaluation of right shoulder pain.  Patient reports that she had a trip and fall 1 month ago and landed directly on her shoulder.  She reports that the area was very bruised, and she thought that it would improve.  She reports that she continues to have pain which is why she came in for evaluation.  She is able to move her shoulde fully but has pain with range of motion.  She denies any paresthesias.  There was no other injury sustained.  Patient Active Problem List   Diagnosis Date Noted   Sensation of pressure in bladder area 04/19/2022   Recurrent UTI 04/19/2022   Vaginal discharge 11/15/2021   Thyroid nodule 06/22/2020   Chest pain of uncertain etiology 51/76/1607   Lightheadedness 05/17/2020   Palpitations 05/17/2020   Dysplasia of cervix, low grade (CIN 1) 03/21/2020   Adjustment disorder with anxious mood 01/20/2020   Anal fissure 01/20/2020   History of anal fissures 08/30/2019   Chronic pain of left knee 08/30/2019   Synovial cyst of left popliteal space 08/30/2019   Pelvic pain 04/21/2018   Uterine leiomyoma 04/21/2018   Leukorrhea 04/21/2018   Frequent headaches 09/09/2013          Physical Exam   Triage Vital Signs: ED Triage Vitals  Enc Vitals Group     BP 09/02/22 1040 126/69     Pulse Rate 09/02/22 1040 95     Resp 09/02/22 1040 18     Temp 09/02/22 1039 98.1 F (36.7 C)     Temp Source 09/02/22 1039 Oral     SpO2 09/02/22 1040 98 %     Weight --      Height --      Head Circumference --      Peak Flow --      Pain Score 09/02/22 1040 5     Pain Loc --      Pain Edu? --      Excl. in [place]? --     Most recent vital signs: Vitals:   09/02/22 1040 09/02/22 1100  BP: 126/69 99/63  Pulse: 95 73  Resp: 18  16  Temp:    SpO2: 98% 98%    Physical Exam Vitals and nursing note reviewed.  Constitutional:      General: Awake and alert. No acute distress.    Appearance: Normal appearance. The patient is normal weight.  HENT:     Head: Normocephalic and atraumatic.     Mouth: Mucous membranes are moist.  Eyes:     General: PERRL. Normal EOMs        Right eye: No discharge.        Left eye: No discharge.     Conjunctiva/sclera: Conjunctivae normal.  Cardiovascular:     Rate and Rhythm: Normal rate and regular rhythm.     Pulses: Normal pulses.  Pulmonary:     Effort: Pulmonary effort is normal. No respiratory distress.     Breath sounds: Normal breath sounds.  Abdominal:     Abdomen is soft. There is no abdominal tenderness. No rebound or guarding. No distention. Musculoskeletal:        General: No swelling. Normal range of motion.  Cervical back: Normal range of motion and neck supple.  No midline cervical spine tenderness.  Full range of motion of neck.  Negative Spurling test.  Negative Lhermitte sign.  Normal strength and sensation in bilateral upper extremities. Normal grip strength bilaterally.  Normal intrinsic muscle function of the hand bilaterally.  Normal radial pulses bilaterally. Right shoulder: No obvious deformity, swelling, ecchymosis, or erythema No clavicular or AC joint tenderness.  Mild tenderness to anterior and lateral shoulder joint line. Able to actively and passively forward flex and abduct at shoulder fully, negative drop arm test Pain with Obriens and empty can, negative SLAP, and lift off tests Normal internal and external rotation against resistance Negative Hawkins and Neers Normal ROM at elbow and wrist Normal resisted pronation and supination 2+ radial pulse Normal grip strength Normal intrinsic hand muscle function Skin:    General: Skin is warm and dry.     Capillary Refill: Capillary refill takes less than 2 seconds.     Findings: No rash.   Neurological:     Mental Status: The patient is awake and alert.      ED Results / Procedures / Treatments   Labs (all labs ordered are listed, but only abnormal results are displayed) Labs Reviewed - No data to display   EKG     RADIOLOGY I independently reviewed and interpreted imaging and agree with radiologists findings.     PROCEDURES:  Critical Care performed:   Procedures   MEDICATIONS ORDERED IN ED: Medications - No data to display   IMPRESSION / MDM / [place] / ED COURSE  I reviewed the triage vital signs and the nursing notes.   Differential diagnosis includes, but is not limited to, contusion, rotator cuff injury, fracture, dislocation.  Patient is awake and alert, hemodynamically stable and neurovascularly intact.  She has full range of motion with active and passive range of motion of her shoulder.  No cervical spine tenderness, normal range of motion of neck, negative Spurling and Lhermitte sign, do not suspect cervical spine injury.  She has mild discomfort with rotator cuff manipulation tests, the negative drop arm test, I do not suspect complete rotator cuff tear.  X-ray was obtained and demonstrates no acute findings.  She requested a sling for comfort which was provided, however she was advised to perform gentle range of motion exercises which were demonstrated to her to prevent adhesive capsulitis.  He was instructed to follow-up with orthopedics and the appropriate follow-up information was provided.  We discussed instructed to precautions and the importance of close outpatient follow-up.  Patient understands and agrees with plan.  Patient was discharged in stable condition.   Patient's presentation is most consistent with acute complicated illness / injury requiring diagnostic workup.     FINAL CLINICAL IMPRESSION(S) / ED DIAGNOSES   Final diagnoses:  Injury of right shoulder, initial encounter     Rx / DC Orders   ED Discharge  Orders     None        Note:  This document was prepared using Dragon voice recognition software and may include unintentional dictation errors.   Emeline Gins 09/02/22 1149    Harvest Dark, MD 09/02/22 1440

## 2022-09-02 NOTE — Discharge Instructions (Signed)
Your x-ray does not show any acute findings to your shoulder.  You were given a sling for comfort, though remove your arm from the sling and perform gentle range of motion exercises several times a day as we discussed.  Please follow-up with orthopedics.  Please return for any new, worsening, or change in symptoms or other concerns.  It was a pleasure caring for you today.

## 2022-09-02 NOTE — ED Triage Notes (Signed)
Patient to ED via POV for right shoulder pain. Patient states she feel approx 1 month ago. Hurts to use arm and unable to sleep on that side.

## 2022-09-11 ENCOUNTER — Encounter: Payer: Self-pay | Admitting: Family Medicine

## 2022-09-11 MED ORDER — LIDOCAINE (ANORECTAL) 5 % EX GEL: CUTANEOUS | 0 refills | Status: DC

## 2022-11-14 NOTE — Progress Notes (Signed)
I,Sulibeya S Dimas,acting as a Neurosurgeon for Shirlee Latch, MD.,have documented all relevant documentation on the behalf of Shirlee Latch, MD,as directed by  Shirlee Latch, MD while in the presence of Shirlee Latch, MD.    Complete physical exam   Patient: Diane Roberts   DOB: 1980-09-17   41 y.o. Female  MRN: [id number] Visit Date: 11/15/2022  Today's healthcare provider: Shirlee Latch, MD   Chief Complaint  Patient presents with   Annual Exam   Subjective    Diane Roberts is a 42 y.o. female who presents today for a complete physical exam.  She reports consuming a general and low fat diet. Home exercise routine includes walking. She generally feels well. She reports sleeping fairly well. She does not have additional problems to discuss today.  HPI   Feeling tired all the time Feels like she can sleep regular hours and then nod off during work [employer] like she should see a therapist - had a partner OD in her home No AM headaches Non-restorative sleep + snoring     11/15/2022   10:00 AM  Results of the Epworth flowsheet  Sitting and reading 3  Watching TV 2  Sitting, inactive in a public place (e.g. a theatre or a meeting) 1  As a passenger in a car for an hour without a break 3  Lying down to rest in the afternoon when circumstances permit 2  Sitting and talking to someone 1  Sitting quietly after a lunch without alcohol 1  In a car, while stopped for a few minutes in traffic 1  Total score 14     Reports irritation of vulva since using a soap she shouldn't have - stopped using this  No current urinary symptoms +vaginal discharge  Past Medical History:  Diagnosis Date   Abnormal Pap smear of cervix    Anemia    after delivery   Complication of anesthesia 2021   Extreme nausea in PACU after hysterectomy. None with prior surgeries.   Frequent headaches    History of kidney stones    currently as of 04-2018 bil   Seizure    age 79 x1    Thyroid nodule 06/22/2020   Past Surgical History:  Procedure Laterality Date   ABLATION     APPENDECTOMY  2012   BILATERAL SALPINGECTOMY Bilateral 07/24/2020   Procedure: BILATERAL SALPINGECTOMY;  Surgeon: Schermerhorn, Ihor Austin, MD;  Location: [facility] ORS;  Service: Gynecology;  Laterality: Bilateral;   BREAST BIOPSY Left 11/21/2016   [place] bx, PASH   CERVIX REMOVAL     CHOLECYSTECTOMY  2002   EXCISION OF BREAST BIOPSY Left 05/30/2021   Procedure: EXCISION OF BREAST BIOPSY;  Surgeon: Carolan Shiver, MD;  Location: [facility] ORS;  Service: General;  Laterality: Left;   LAPAROSCOPIC LYSIS OF ADHESIONS  05/11/2018   Procedure: LAPAROSCOPIC LYSIS OF ADHESIONS;  Surgeon: Herold Harms, MD;  Location: [facility] ORS;  Service: Gynecology;;   LAPAROSCOPY N/A 05/11/2018   Procedure: LAPAROSCOPY DIAGNOSTIC WITH BIOPSIES;  Surgeon: Herold Harms, MD;  Location: [facility] ORS;  Service: Gynecology;  Laterality: N/A;   OVARIAN CYST REMOVAL     TONSILLECTOMY  2010   VAGINAL HYSTERECTOMY N/A 07/24/2020   Procedure: HYSTERECTOMY VAGINAL;  Surgeon: Schermerhorn, Ihor Austin, MD;  Location: [facility] ORS;  Service: Gynecology;  Laterality: N/A;   Social History   Socioeconomic History   Marital status: Single    Spouse name: Not on file   Number of children: 2  Years of education: Not on file   Highest education level: Not on file  Occupational History   Occupation: quality lead  Tobacco Use   Smoking status: Every Day    Types: E-cigarettes   Smokeless tobacco: Never   Tobacco comments:    Vapes now.  Vaping Use   Vaping Use: Every day   Substances: Nicotine, Flavoring  Substance and Sexual Activity   Alcohol use: Yes    Alcohol/week: 1.0 standard drink of alcohol    Types: 1 Cans of beer per week    Comment: occasional   Drug use: No   Sexual activity: Yes    Partners: Male    Comment: ablation  Other Topics Concern   Not on file  Social History Narrative   Mother lives in the home    Social Determinants of Health   Financial Resource Strain: Not on file  Food Insecurity: Not on file  Transportation Needs: Not on file  Physical Activity: Not on file  Stress: Not on file  Social Connections: Not on file  Intimate Partner Violence: Not on file   Family Status  Relation Name Status   Mother  Alive   Father  Alive   MGM  Deceased   MGF  Deceased   PGF  Deceased   Neg Hx  (Not Specified)   Family History  Problem Relation Age of Onset   Hypertension Mother    Kidney disease Mother    Hyperlipidemia Mother    Thyroid disease Mother        thyroid cancer   COPD Mother    Osteoporosis Mother    Hypertension Father    Clotting disorder Maternal Grandmother        reports she died of a blood clot   COPD Maternal Grandfather    Gout Maternal Grandfather    Heart failure Maternal Grandfather    Heart failure Paternal Grandfather    Hypertension Paternal Grandfather    COPD Paternal Grandfather    Breast cancer Neg Hx    Ovarian cancer Neg Hx    Colon cancer Neg Hx    No Known Allergies  Patient Care Team: Erasmo Downer, MD as PCP - General (Family Medicine)   Medications: Outpatient Medications Prior to Visit  Medication Sig   acetaminophen (TYLENOL) 325 MG tablet Take 650 mg by mouth as needed.   Lidocaine, Anorectal, 5 % GEL Apply topically BID prn for anal fissureApply topically BID prn for anal fissure   No facility-administered medications prior to visit.    Review of Systems  Genitourinary:  Positive for frequency and vaginal pain.  All other systems reviewed and are negative.     Objective    BP 91/60 (BP Location: Left Arm, Patient Position: Sitting, Cuff Size: Normal)   Pulse 67   Temp 97.7 F (36.5 C) (Temporal)   Resp 12   Ht  (1.626 m)   Wt 151 lb (68.5 kg)   LMP  (LMP Unknown)   BMI 25.92 kg/m     Physical Exam Vitals reviewed.  Constitutional:      General: She is not in acute distress.    Appearance:  Normal appearance. She is well-developed. She is not diaphoretic.  HENT:     Head: Normocephalic and atraumatic.     Right Ear: Tympanic membrane, ear canal and external ear normal.     Left Ear: Tympanic membrane, ear canal and external ear normal.     Nose: Nose normal.  Mouth/Throat:     Mouth: Mucous membranes are moist.     Pharynx: Oropharynx is clear. No oropharyngeal exudate.  Eyes:     General: No scleral icterus.    Conjunctiva/sclera: Conjunctivae normal.     Pupils: Pupils are equal, round, and reactive to light.  Neck:     Thyroid: No thyromegaly.  Cardiovascular:     Rate and Rhythm: Normal rate and regular rhythm.     Pulses: Normal pulses.     Heart sounds: Normal heart sounds. No murmur heard. Pulmonary:     Effort: Pulmonary effort is normal. No respiratory distress.     Breath sounds: Normal breath sounds. No wheezing or rales.  Abdominal:     General: There is no distension.     Palpations: Abdomen is soft.     Tenderness: There is no abdominal tenderness.  Genitourinary:    Comments: GYN:  External genitalia normal, except for irritation of labia majora.  Vaginal mucosa pink, moist, normal rugae.  Surgically absent cervix, + discharge, no bleeding noted on speculum exam.   Musculoskeletal:        General: No deformity.     Cervical back: Neck supple.     Right lower leg: No edema.     Left lower leg: No edema.  Lymphadenopathy:     Cervical: No cervical adenopathy.  Skin:    General: Skin is warm and dry.     Findings: No rash.  Neurological:     Mental Status: She is alert and oriented to person, place, and time. Mental status is at baseline.     Sensory: No sensory deficit.     Motor: No weakness.     Gait: Gait normal.  Psychiatric:        Mood and Affect: Mood normal.        Behavior: Behavior normal.        Thought Content: Thought content normal.       Last depression screening scores    11/15/2022    9:43 AM 04/19/2022   10:07 AM  11/15/2021    3:05 PM  PHQ 2/9 Scores  PHQ - 2 Score 0 0 0  PHQ- 9 Score 2 1 2    Last fall risk screening    11/15/2022    9:43 AM  Fall Risk   Falls in the past year? 0  Number falls in past yr: 0  Injury with Fall? 0  Risk for fall due to : No Fall Risks  Follow up Falls evaluation completed   Last Audit-C alcohol use screening    11/15/2022    9:44 AM  Alcohol Use Disorder Test (AUDIT)  1. How often do you have a drink containing alcohol? 2  2. How many drinks containing alcohol do you have on a typical day when you are drinking? 0  3. How often do you have six or more drinks on one occasion? 1  AUDIT-C Score 3   A score of 3 or more in women, and 4 or more in men indicates increased risk for alcohol abuse, EXCEPT if all of the points are from question 1   No results found for any visits on 11/15/22.  Assessment & Plan    Routine Health Maintenance and Physical Exam  Exercise Activities and Dietary recommendations  Goals   None     Immunization History  Administered Date(s) Administered   Influenza Inj Mdck Quad Pf 05/06/2019   Influenza,inj,Quad PF,6+ Mos 05/26/2019, 04/12/2020, 05/02/2021  Influenza-Unspecified 06/12/2013, 05/21/2017, 05/26/2018   Tdap 05/28/2017    Health Maintenance  Topic Date Due   COVID-19 Vaccine (1) Never done   INFLUENZA VACCINE  03/13/2023   DTaP/Tdap/Td (2 - Td or Tdap) 05/29/2027   Hepatitis C Screening  Completed   HIV Screening  Completed   HPV VACCINES  Aged Out    Discussed health benefits of physical activity, and encouraged her to engage in regular exercise appropriate for her age and condition.  Problem List Items Addressed This Visit       Nervous and Auditory   Non-restorative sleep    Long-standing issue - worsening Patient complains of fatigue and daytime drowsiness Check labs to ensure no underlying pathology High Epworth score Concern for possible OSA Home sleep study ordered Further management pending  results      Relevant Orders   Ambulatory referral to Sleep Studies     Other   Vaginal discharge    Recurrent issue Patient is concern for possible BV given that this started after using different soaps/hygiene measures Swab collected today of the discharge to test for possible causes Treatment pending results      Relevant Orders   Cervicovaginal ancillary only   Fatigue    Plan as above Will check CMP, CBC, Vit D, B12, TSH to evaluate for any underlying causes       Relevant Orders   CBC with Differential/Platelet   Comprehensive metabolic panel   Vitamin B12   VITAMIN D 25 Hydroxy (Vit-D Deficiency, Fractures)   TSH   Ambulatory referral to Sleep Studies   Other Visit Diagnoses     Encounter for annual physical exam    -  Primary   Relevant Orders   CBC with Differential/Platelet   Comprehensive metabolic panel   Vitamin B12   VITAMIN D 25 Hydroxy (Vit-D Deficiency, Fractures)   TSH   Lipid panel   Breast cancer screening by mammogram       Relevant Orders   MM 3D SCREENING MAMMOGRAM BILATERAL BREAST   Screening, lipid       Relevant Orders   Lipid panel        Return in about 1 year (around 11/15/2023) for CPE.     I, Shirlee Latch, MD, have reviewed all documentation for this visit. The documentation on 11/15/22 for the exam, diagnosis, procedures, and orders are all accurate and complete.   Anajah Sterbenz, Marzella Schlein, MD, MPH [facility]

## 2022-11-15 ENCOUNTER — Other Ambulatory Visit (HOSPITAL_COMMUNITY)
Admission: RE | Admit: 2022-11-15 | Discharge: 2022-11-15 | Disposition: A | Discharge status: Home / Self Care | Payer: No Typology Code available for payment source | Source: Ambulatory Visit | Attending: Family Medicine | Admitting: Family Medicine

## 2022-11-15 ENCOUNTER — Encounter: Payer: Self-pay | Admitting: Family Medicine

## 2022-11-15 ENCOUNTER — Ambulatory Visit (INDEPENDENT_AMBULATORY_CARE_PROVIDER_SITE_OTHER): Payer: No Typology Code available for payment source | Admitting: Family Medicine

## 2022-11-15 VITALS — BP 91/60 | HR 67 | Temp 97.7°F | Resp 12 | Ht 64.0 in | Wt 151.0 lb

## 2022-11-15 DIAGNOSIS — G478 Other sleep disorders: Secondary | ICD-10-CM | POA: Diagnosis not present

## 2022-11-15 DIAGNOSIS — Z1231 Encounter for screening mammogram for malignant neoplasm of breast: Secondary | ICD-10-CM

## 2022-11-15 DIAGNOSIS — N898 Other specified noninflammatory disorders of vagina: Secondary | ICD-10-CM | POA: Diagnosis not present

## 2022-11-15 DIAGNOSIS — Z0000 Encounter for general adult medical examination without abnormal findings: Secondary | ICD-10-CM

## 2022-11-15 DIAGNOSIS — R5383 Other fatigue: Secondary | ICD-10-CM | POA: Diagnosis not present

## 2022-11-15 DIAGNOSIS — Z13220 Encounter for screening for lipoid disorders: Secondary | ICD-10-CM | POA: Diagnosis not present

## 2022-11-15 NOTE — Assessment & Plan Note (Signed)
Recurrent issue Patient is concern for possible BV given that this started after using different soaps/hygiene measures Swab collected today of the discharge to test for possible causes Treatment pending results

## 2022-11-15 NOTE — Assessment & Plan Note (Signed)
Plan as above Will check CMP, CBC, Vit D, B12, TSH to evaluate for any underlying causes

## 2022-11-15 NOTE — Assessment & Plan Note (Addendum)
Long-standing issue - worsening Patient complains of fatigue and daytime drowsiness Check labs to ensure no underlying pathology High Epworth score Concern for possible OSA Home sleep study ordered Further management pending results

## 2022-11-16 LAB — COMPREHENSIVE METABOLIC PANEL
ALT: 19 IU/L (ref 0–32)
AST: 17 IU/L (ref 0–40)
Albumin/Globulin Ratio: 1.9 (ref 1.2–2.2)
Albumin: 4.3 g/dL (ref 3.9–4.9)
Alkaline Phosphatase: 74 IU/L (ref 44–121)
BUN/Creatinine Ratio: 14 (ref 9–23)
BUN: 10 mg/dL (ref 6–24)
Bilirubin Total: 0.5 mg/dL (ref 0.0–1.2)
CO2: 22 mmol/L (ref 20–29)
Calcium: 9.6 mg/dL (ref 8.7–10.2)
Chloride: 105 mmol/L (ref 96–106)
Creatinine, Ser: 0.74 mg/dL (ref 0.57–1.00)
Globulin, Total: 2.3 g/dL (ref 1.5–4.5)
Glucose: 97 mg/dL (ref 70–99)
Potassium: 4.7 mmol/L (ref 3.5–5.2)
Sodium: 141 mmol/L (ref 134–144)
Total Protein: 6.6 g/dL (ref 6.0–8.5)
eGFR: 105 mL/min/{1.73_m2} (ref >59)

## 2022-11-16 LAB — LIPID PANEL
Chol/HDL Ratio: 3.1 ratio (ref 0.0–4.4)
Cholesterol, Total: 149 mg/dL (ref 100–199)
HDL: 48 mg/dL (ref >39)
LDL Chol Calc (NIH): 88 mg/dL (ref 0–99)
Triglycerides: 62 mg/dL (ref 0–149)
VLDL Cholesterol Cal: 13 mg/dL (ref 5–40)

## 2022-11-16 LAB — CBC WITH DIFFERENTIAL/PLATELET
Basophils Absolute: 0.1 10*3/uL (ref 0.0–0.2)
Basos: 1 %
EOS (ABSOLUTE): 0.2 10*3/uL (ref 0.0–0.4)
Eos: 4 %
Hematocrit: 43.8 % (ref 34.0–46.6)
Hemoglobin: 15.1 g/dL (ref 11.1–15.9)
Immature Grans (Abs): 0 10*3/uL (ref 0.0–0.1)
Immature Granulocytes: 0 %
Lymphocytes Absolute: 1.5 10*3/uL (ref 0.7–3.1)
Lymphs: 23 %
MCH: 31 pg (ref 26.6–33.0)
MCHC: 34.5 g/dL (ref 31.5–35.7)
MCV: 90 fL (ref 79–97)
Monocytes Absolute: 0.6 10*3/uL (ref 0.1–0.9)
Monocytes: 9 %
Neutrophils Absolute: 4.3 10*3/uL (ref 1.4–7.0)
Neutrophils: 63 %
Platelets: 196 10*3/uL (ref 150–450)
RBC: 4.87 x10E6/uL (ref 3.77–5.28)
RDW: 11.6 % — ABNORMAL LOW (ref 11.7–15.4)
WBC: 6.8 10*3/uL (ref 3.4–10.8)

## 2022-11-16 LAB — VITAMIN D 25 HYDROXY (VIT D DEFICIENCY, FRACTURES): Vit D, 25-Hydroxy: 19.4 ng/mL — ABNORMAL LOW (ref 30.0–100.0)

## 2022-11-16 LAB — VITAMIN B12: Vitamin B-12: 365 pg/mL (ref 232–1245)

## 2022-11-16 LAB — TSH: TSH: 0.756 u[IU]/mL (ref 0.450–4.500)

## 2022-11-18 ENCOUNTER — Telehealth: Payer: Self-pay

## 2022-11-18 LAB — CERVICOVAGINAL ANCILLARY ONLY
Bacterial Vaginitis (gardnerella): POSITIVE — AB
Candida Glabrata: NEGATIVE
Candida Vaginitis: NEGATIVE
Chlamydia: NEGATIVE
Comment: NEGATIVE
Comment: NEGATIVE
Comment: NEGATIVE
Comment: NEGATIVE
Comment: NEGATIVE
Comment: NORMAL
Neisseria Gonorrhea: NEGATIVE
Trichomonas: POSITIVE — AB

## 2022-11-18 MED ORDER — METRONIDAZOLE 500 MG PO TABS: 500.0000 mg | ORAL_TABLET | Freq: Two times a day (BID) | ORAL | 0 refills | Status: AC

## 2022-11-18 NOTE — Telephone Encounter (Signed)
-----   Message from Erasmo Downer, MD sent at 11/18/2022  2:28 PM EDT ----- Vaginal swab is positive for trichomonas and BV. Treat with Metronidazole 500mg  BID x7d (Ok to eRx #14 r0). Any sexual partners should also be tested/treated for Trichomonas.

## 2022-11-21 ENCOUNTER — Encounter: Payer: Self-pay | Admitting: Physician Assistant

## 2022-11-21 ENCOUNTER — Encounter: Payer: Self-pay | Admitting: Family Medicine

## 2022-11-21 ENCOUNTER — Ambulatory Visit (INDEPENDENT_AMBULATORY_CARE_PROVIDER_SITE_OTHER): Payer: No Typology Code available for payment source | Admitting: Physician Assistant

## 2022-11-21 VITALS — BP 108/64 | HR 80 | Temp 97.7°F | Ht 64.0 in | Wt 152.0 lb

## 2022-11-21 DIAGNOSIS — H10023 Other mucopurulent conjunctivitis, bilateral: Secondary | ICD-10-CM

## 2022-11-21 DIAGNOSIS — R0981 Nasal congestion: Secondary | ICD-10-CM

## 2022-11-21 MED ORDER — POLYMYXIN B-TRIMETHOPRIM 10000-0.1 UNIT/ML-% OP SOLN: 1.0000 [drp] | OPHTHALMIC | 0 refills | Status: DC

## 2022-11-21 MED ORDER — FLUTICASONE PROPIONATE 50 MCG/ACT NA SUSP
2.0000 | Freq: Every day | NASAL | 6 refills | Status: DC
Start: 2022-11-21 — End: 2024-05-13

## 2022-11-21 NOTE — Progress Notes (Signed)
Established patient visit   Patient: Diane Roberts   DOB: [birth year]   41 y.o. Female  MRN: [id number] Visit Date: 11/21/2022  Today's healthcare provider: Debera Lat, PA-C   CC: pink eye, nasal discharge x 6 days  Subjective     HPI   Pink eye--discharge, dry, painful, nose drainage w/ light green, right ear--6 days Tried eye drop and sudafed. Last edited by Shelly Bombard, CMA on 11/21/2022 10:50 AM.      Pt developed "pink eye" after a brief vacation to beach, denies pain with eye movements, endorses having more puffiness around the eyes. No one in her family has been having similar symptoms  Medications: Outpatient Medications Prior to Visit  Medication Sig   acetaminophen (TYLENOL) 325 MG tablet Take 650 mg by mouth as needed.   Lidocaine, Anorectal, 5 % GEL Apply topically BID prn for anal fissureApply topically BID prn for anal fissure   metroNIDAZOLE (FLAGYL) 500 MG tablet Take 1 tablet (500 mg total) by mouth 2 (two) times daily for 7 days.   No facility-administered medications prior to visit.    Review of Systems  Eyes:  Positive for pain, discharge and redness. Negative for photophobia, itching and visual disturbance.       Objective    BP 108/64   Pulse 80   Temp 97.7 F (36.5 C)   Ht 5\' 4"  (1.626 m)   Wt 152 lb (68.9 kg)   LMP  (LMP Unknown)   SpO2 98%   BMI 26.09 kg/m    Physical Exam HENT:     Right Ear: Ear canal and external ear normal.     Left Ear: Tympanic membrane, ear canal and external ear normal.     Ears:     Comments: Fluids behind the rTM    Nose: Congestion and rhinorrhea present.     Mouth/Throat:     Pharynx: No posterior oropharyngeal erythema.     Comments: Postnasal drainage Eyes:     General: No scleral icterus.       Right eye: Discharge present.        Left eye: Discharge present.    Extraocular Movements: Extraocular movements intact.     Pupils: Pupils are equal, round, and reactive to light.      No  results found for any visits on 11/21/22.  Assessment & Plan     1. Other mucopurulent conjunctivitis of both eyes Acute problem Explained the self-limited nature of conjuctivitis Advised to proceed with artificial tears and abx tears - trimethoprim-polymyxin b (POLYTRIM) ophthalmic solution; Place 1 drop into both eyes every 4 (four) hours.  Dispense: 10 mL; Refill: 0 - fluticasone (FLONASE) 50 MCG/ACT nasal spray; Place 2 sprays into both nostrils daily.  Dispense: 16 g; Refill: 6  2. Nasal congestion Could be due to allergic rhinitis Current day nicotine vaping Advised symptomatic treatment: - Drink plenty of water - Use nasal saline rinses before nose sprays such as with Neilmed Sinus Rinse bottle.  Use distilled water.   - Use Flonase 2 sprays each nostril daily. Aim upward and outward. - Use Zyrtec 10 mg OTC or Allegra/Claritin OTC daily.  - fluticasone (FLONASE) 50 MCG/ACT nasal spray; Place 2 sprays into both nostrils daily.  Dispense: 16 g; Refill: 6 Smoking cessation advised.   No follow-ups on file.     The patient was advised to call back or seek an in-person evaluation if the symptoms worsen or if the condition  fails to improve as anticipated.  I discussed the assessment and treatment plan with the patient. The patient was provided an opportunity to ask questions and all were answered. The patient agreed with the plan and demonstrated an understanding of the instructions.  I, Debera Lat, PA-C have reviewed all documentation for this visit. The documentation on  12/02/22 for the exam, diagnosis, procedures, and orders are all accurate and complete.  Debera Lat, [facility], MMS [facility] [phone] (phone) [phone] (fax)   [facility]

## 2022-12-05 ENCOUNTER — Encounter: Payer: Self-pay | Admitting: Family Medicine

## 2022-12-05 ENCOUNTER — Other Ambulatory Visit (HOSPITAL_COMMUNITY)
Admission: RE | Admit: 2022-12-05 | Discharge: 2022-12-05 | Disposition: A | Discharge status: Home / Self Care | Payer: No Typology Code available for payment source | Source: Ambulatory Visit | Attending: Family Medicine | Admitting: Family Medicine

## 2022-12-05 ENCOUNTER — Ambulatory Visit (INDEPENDENT_AMBULATORY_CARE_PROVIDER_SITE_OTHER): Payer: No Typology Code available for payment source | Admitting: Family Medicine

## 2022-12-05 VITALS — BP 97/62 | HR 66 | Temp 98.1°F | Wt 150.9 lb

## 2022-12-05 DIAGNOSIS — N898 Other specified noninflammatory disorders of vagina: Secondary | ICD-10-CM | POA: Insufficient documentation

## 2022-12-05 DIAGNOSIS — G478 Other sleep disorders: Secondary | ICD-10-CM | POA: Diagnosis not present

## 2022-12-05 DIAGNOSIS — K602 Anal fissure, unspecified: Secondary | ICD-10-CM | POA: Diagnosis not present

## 2022-12-05 MED ORDER — NITROGLYCERIN 0.4 % RE OINT: TOPICAL_OINTMENT | RECTAL | 1 refills | Status: DC

## 2022-12-05 NOTE — Assessment & Plan Note (Signed)
Chronic and worsening.  Possibly OSA.  Referral for sleep study previously placed. Patient provided phone number of the sleep clinic to schedule a sleep study.

## 2022-12-05 NOTE — Assessment & Plan Note (Signed)
Chronic and reoccurring.  One small anterior fissure noted on examination. Start lidocaine topical as needed for pain. Start nitroglycerin topical to increase healing.  Patient advised of potential side effects, such as headaches with topical nitroglycerin.

## 2022-12-05 NOTE — Progress Notes (Signed)
Acute Office Visit  Subjective:     Patient ID: Diane Roberts, female    DOB: 04/10/1981, 42 y.o.   MRN: [id number]  No chief complaint on file.   HPI Vaginal discharge and odor  Was treated 2 weeks ago for trichomonas and bacterial vaginosis. Partner completed treatment for trichomonas.    Is still experiencing white discharge and fishy odor.  Denies fever, chills, pruritus, dysuria, vaginal bleeding, rash, dyspareunia, N/V/D, and abdominal pain.  Anal fissures When they flare she is in so much pain that she has difficulty walking.  Did not get her recent prescription due to a mix up at the pharmacy.   Sleep difficulties  Repeatedly woke up gasping during her nap yesterday.  Referral for sleep study previously placed.  She has not been contacted to schedule sleep study.      ROS: as stated in HPI      Objective:    BP 97/62   Pulse 66   Temp 98.1 F (36.7 C)   Wt 150 lb 14.4 oz (68.4 kg)   LMP  (LMP Unknown)   BMI 25.90 kg/m    Physical Exam Exam conducted with a chaperone present.  Constitutional:      General: She is not in acute distress. Cardiovascular:     Pulses: Normal pulses.     Heart sounds: Normal heart sounds.  Pulmonary:     Effort: Pulmonary effort is normal.     Breath sounds: Normal breath sounds.  Abdominal:     General: Bowel sounds are normal. There is no distension.     Palpations: Abdomen is soft. There is no mass.     Tenderness: There is no abdominal tenderness.  Genitourinary:    General: Normal vulva.     Exam position: Supine.     Pubic Area: No rash.      Labia:        Right: No rash or lesion.        Left: No rash or lesion.      Comments: White discharge in vagina.  Small fissure noted superior to the anus.  Skin:    General: Skin is warm and dry.  Neurological:     Mental Status: She is alert.     No results found for any visits on 12/05/22.      Assessment & Plan:   Problem List Items Addressed This  Visit     Anal fissure    Chronic and reoccurring.  One small anterior fissure noted on examination. Start lidocaine topical as needed for pain. Start nitroglycerin topical to increase healing.  Patient advised of potential side effects, such as headaches with topical nitroglycerin.       Vaginal discharge - Primary    Treated 2 weeks ago for trichomonas and BV.  Swab at that time negative for chlamydia and gonorrhea Given recent treatment of both patient and partner for trichomonas, likely BV.  Swab collected at today's visit. Treatment pending results.        Relevant Orders   Cervicovaginal ancillary only   Non-restorative sleep    Chronic and worsening.  Possibly OSA.  Referral for sleep study previously placed. Patient provided phone number of the sleep clinic to schedule a sleep study.         Meds ordered this encounter  Medications   Nitroglycerin 0.4 % OINT    Sig: Apply 1 inch (375 mg) ointment intra-anally every 12 hours for anal fissure  Dispense:  30 g    Refill:  1    No follow-ups on file.  Gilmer Mor, Medical Student   Patient seen along with MS3 student Ezekiel Slocumb. I personally evaluated this patient along with the student, and verified all aspects of the history, physical exam, and medical decision making as documented by the student. I agree with the student's documentation and have made all necessary edits.  Mckenlee Mangham, Marzella Schlein, MD, MPH [facility]

## 2022-12-05 NOTE — Assessment & Plan Note (Addendum)
Treated 2 weeks ago for trichomonas and BV.  Swab at that time negative for chlamydia and gonorrhea Given recent treatment of both patient and partner for trichomonas, likely BV.  Swab collected at today's visit. Treatment pending results.

## 2022-12-09 ENCOUNTER — Other Ambulatory Visit: Payer: Self-pay | Admitting: Family Medicine

## 2022-12-09 LAB — CERVICOVAGINAL ANCILLARY ONLY
Bacterial Vaginitis (gardnerella): POSITIVE — AB
Candida Glabrata: NEGATIVE
Candida Vaginitis: NEGATIVE
Comment: NEGATIVE
Comment: NEGATIVE
Comment: NEGATIVE
Comment: NEGATIVE
Trichomonas: NEGATIVE

## 2022-12-09 MED ORDER — METRONIDAZOLE 500 MG PO TABS: 500.0000 mg | ORAL_TABLET | Freq: Two times a day (BID) | ORAL | 0 refills | Status: DC

## 2022-12-13 ENCOUNTER — Ambulatory Visit: Payer: Self-pay | Admitting: *Deleted

## 2022-12-13 DIAGNOSIS — B1920 Unspecified viral hepatitis C without hepatic coma: Secondary | ICD-10-CM

## 2022-12-13 NOTE — Telephone Encounter (Signed)
  Chief Complaint: Patient went for plasma donation and got letter stating her had tested + Hep C Symptoms: patient is concerned about this result and would like to know what next steps would be- does she need further testing? Patient only wants to see PCP- but would be willing to come for labs if needed before her appointment.  Disposition: [] ED /[] Urgent Care (no appt availability in office) / [x] Appointment(In office/virtual)/ []  [facility]/ [] Home Care/ [] Refused Recommended Disposition /[] [facility]/ []  Follow-up with PCP Additional Notes: Patient is concerned about information she received from [facility] - + Hep C. Patient wants to know what next steps would be- only wants to see PCP- but will take any information she can be given or do any follow up testing before she can see PCP. Offered appointment with another provider- but patient wants to see PCP. Reason for Disposition  [1] Caller requesting NON-URGENT health information AND [2] PCP's office is the best resource  Answer Assessment - Initial Assessment Questions 1. REASON FOR CALL or QUESTION: "What is your reason for calling today?" or "How can I best help you?" or "What question do you have that I can help answer?"     Patient went to give plasma and was notified the Hep C screening came back positive. Patient would like to know what would be next steps- patient really wants to see PCP- but if further work up is needed- she would like to go ahead and get that done before her appointment with PCP. She is very concerned  Protocols used: Information Only Call - No Triage-A-AH

## 2022-12-13 NOTE — Telephone Encounter (Signed)
Summary: Questions for Nurse/PCP   Pt called reporting that she just received test results that she is alarmed by, wants to discuss with PCP. No appt soon enough. Advised pt to send detailed mychart message.         Attempted to call patient- no answer- left message to call office

## 2022-12-16 NOTE — Telephone Encounter (Signed)
Recommend referral to ID for confirmatory testing.  Referral submitted   Ronnald Ramp, MD  [facility]

## 2022-12-16 NOTE — Addendum Note (Signed)
Addended by: Bing Neighbors on: 12/16/2022 04:39 PM   Modules accepted: Orders

## 2022-12-17 NOTE — Telephone Encounter (Signed)
Patient advised. Reports that she still keeping her appointment with Dr. B to address.

## 2022-12-25 NOTE — Progress Notes (Unsigned)
   I,Odester Nilson S Eyob Godlewski,acting as a Neurosurgeon for Shirlee Latch, MD.,have documented all relevant documentation on the behalf of Shirlee Latch, MD,as directed by  Shirlee Latch, MD while in the presence of Shirlee Latch, MD.     Established patient visit   Patient: Diane Roberts   DOB: 1981/07/10   41 y.o. Female  MRN: [id number] Visit Date: 12/26/2022  Today's healthcare provider: Shirlee Latch, MD   No chief complaint on file.  Subjective    HPI  Patient reports getting labs done outside our office for plasma donation. She reports she was advised that her Hep C came back positive.   Medications: Outpatient Medications Prior to Visit  Medication Sig   acetaminophen (TYLENOL) 325 MG tablet Take 650 mg by mouth as needed.   fluticasone (FLONASE) 50 MCG/ACT nasal spray Place 2 sprays into both nostrils daily.   Lidocaine, Anorectal, 5 % GEL Apply topically BID prn for anal fissureApply topically BID prn for anal fissure   metroNIDAZOLE (FLAGYL) 500 MG tablet Take 1 tablet (500 mg total) by mouth 2 (two) times daily.   Nitroglycerin 0.4 % OINT Apply 1 inch (375 mg) ointment intra-anally every 12 hours for anal fissure   trimethoprim-polymyxin b (POLYTRIM) ophthalmic solution Place 1 drop into both eyes every 4 (four) hours.   No facility-administered medications prior to visit.    Review of Systems  {Labs  Heme  Chem  Endocrine  Serology  Results Review (optional):23779}   Objective    LMP  (LMP Unknown)  {Show previous vital signs (optional):23777}  Physical Exam  ***  No results found for any visits on 12/26/22.  Assessment & Plan     ***  No follow-ups on file.      {provider attestation***:1}   Shirlee Latch, MD  [facility] [phone] (phone) [phone] (fax)  [facility]

## 2022-12-26 ENCOUNTER — Ambulatory Visit (INDEPENDENT_AMBULATORY_CARE_PROVIDER_SITE_OTHER): Payer: No Typology Code available for payment source | Admitting: Family Medicine

## 2022-12-26 ENCOUNTER — Other Ambulatory Visit: Payer: Self-pay

## 2022-12-26 ENCOUNTER — Encounter: Payer: Self-pay | Admitting: Family Medicine

## 2022-12-26 ENCOUNTER — Ambulatory Visit (INDEPENDENT_AMBULATORY_CARE_PROVIDER_SITE_OTHER): Payer: No Typology Code available for payment source | Admitting: Internal Medicine

## 2022-12-26 ENCOUNTER — Encounter: Payer: Self-pay | Admitting: Internal Medicine

## 2022-12-26 ENCOUNTER — Encounter: Payer: No Typology Code available for payment source | Admitting: Internal Medicine

## 2022-12-26 VITALS — BP 125/81 | HR 74 | Temp 98.2°F | Resp 12 | Wt 145.9 lb

## 2022-12-26 VITALS — BP 105/71 | HR 74 | Resp 16 | Ht 64.0 in | Wt 146.0 lb

## 2022-12-26 DIAGNOSIS — R899 Unspecified abnormal finding in specimens from other organs, systems and tissues: Secondary | ICD-10-CM | POA: Diagnosis not present

## 2022-12-26 DIAGNOSIS — R768 Other specified abnormal immunological findings in serum: Secondary | ICD-10-CM

## 2022-12-26 DIAGNOSIS — Z7721 Contact with and (suspected) exposure to potentially hazardous body fluids: Secondary | ICD-10-CM | POA: Diagnosis not present

## 2022-12-26 NOTE — Progress Notes (Signed)
[facility]  Reason for Consult:chronic hep c Referring Provider: Shirlee Latch    Patient Active Problem List   Diagnosis Date Noted   Non-restorative sleep 11/15/2022   Fatigue 11/15/2022   Recurrent UTI 04/19/2022   Vaginal discharge 11/15/2021   Thyroid nodule 06/22/2020   Dysplasia of cervix, low grade (CIN 1) 03/21/2020   Adjustment disorder with anxious mood 01/20/2020   Anal fissure 01/20/2020   Chronic pain of left knee 08/30/2019   Synovial cyst of left popliteal space 08/30/2019   Pelvic pain 04/21/2018   Uterine leiomyoma 04/21/2018   Leukorrhea 04/21/2018   Frequent headaches 09/09/2013      HPI: Diane Roberts is a 42 y.o. female former substance abuse referred here for chronic hep c  Patient was recently tested 11/2022 when she tried to donate asthma. She was sent a letter that said she can't donate blood  "Hep b sAg & pcr negative; hep C pcr negative, hcv supplemental positive; hiv1/2 negative and hiv pcr negative."  She also had on 08/2021 another screen for concern of needle exposure, negative hiv and hcv antibody  Her last tatoos was 1.5 year Never done ivdu or ivdu  She is with her current partner of over a year  Her partner tested recently and said she was negative   She denies recent within the past 6 months jaundice, n/v, flu like illness    Review of Systems: ROS All other ros negative      Past Medical History:  Diagnosis Date   Abnormal Pap smear of cervix    Anemia    after delivery   Complication of anesthesia 2021   Extreme nausea in PACU after hysterectomy. None with prior surgeries.   Frequent headaches    History of kidney stones    currently as of 04-2018 bil   Seizure [facility])    age 45 x1   Thyroid nodule 06/22/2020    Social History   Tobacco Use   Smoking status: Every Day    Types: E-cigarettes   Smokeless tobacco: Never   Tobacco comments:    Vapes now.  Vaping  Use   Vaping Use: Every day   Substances: Nicotine, Flavoring  Substance Use Topics   Alcohol use: Yes    Alcohol/week: 1.0 standard drink of alcohol    Types: 1 Cans of beer per week    Comment: occasional   Drug use: No    Family History  Problem Relation Age of Onset   Hypertension Mother    Kidney disease Mother    Hyperlipidemia Mother    Thyroid disease Mother        thyroid cancer   COPD Mother    Osteoporosis Mother    Hypertension Father    Clotting disorder Maternal Grandmother        reports she died of a blood clot   COPD Maternal Grandfather    Gout Maternal Grandfather    Heart failure Maternal Grandfather    Heart failure Paternal Grandfather    Hypertension Paternal Grandfather    COPD Paternal Grandfather    Breast cancer Neg Hx    Ovarian cancer Neg Hx    Colon cancer Neg Hx     No Known Allergies  OBJECTIVE: Vitals:   12/26/22 1429  BP: 105/71  Pulse: 74  Resp: 16  Weight: 146 lb (66.2 kg)  Height: 5\' 4"  (1.626 m)   Body mass index is  25.06 kg/m.   Physical Exam General/constitutional: no distress, pleasant HEENT: Normocephalic, PER, Conj Clear, EOMI, Oropharynx clear Neck supple CV: rrr no mrg Lungs: clear to auscultation, normal respiratory effort Abd: Soft, Nontender Ext: no edema Skin: No Rash Neuro: nonfocal MSK: no peripheral joint swelling/tenderness/warmth; back spines nontender    Lab: Lab Results  Component Value Date   WBC 6.8 11/15/2022   HGB 15.1 11/15/2022   HCT 43.8 11/15/2022   MCV 90 11/15/2022   PLT 196 11/15/2022   Last metabolic panel Lab Results  Component Value Date   GLUCOSE 97 11/15/2022   NA 141 11/15/2022   K 4.7 11/15/2022   CL 105 11/15/2022   CO2 22 11/15/2022   BUN 10 11/15/2022   CREATININE 0.74 11/15/2022   EGFR 105 11/15/2022   CALCIUM 9.6 11/15/2022   PROT 6.6 11/15/2022   ALBUMIN 4.3 11/15/2022   LABGLOB 2.3 11/15/2022   AGRATIO 1.9 11/15/2022   BILITOT 0.5 11/15/2022    ALKPHOS 74 11/15/2022   AST 17 11/15/2022   ALT 19 11/15/2022   ANIONGAP 9 07/20/2020    Microbiology:  Serology:  Imaging:   Assessment/plan: Problem List Items Addressed This Visit   None Visit Diagnoses     Abnormal laboratory test    -  Primary   Relevant Orders   Hepatitis, Acute   HCV RNA quant   HIV antibody (with reflex)   Exposure to blood-borne pathogen             Abnormal test Hcv pcr negative Hcv ?antibody positive 11/2022; unclear what the word "supplemental means" and I presume it is antibody   Discuss blood borne pathogen risk of transmission and natural course   Some times between 08/2021 and 11/2022 she might have been exposed. No real obvious risk (denies ivdu/indu).   She shares deodorant with her fiancee but really no other sharp blade equipments. She does go out to Paediatric nurse for hair care   We will repeat full blood borne pathogen panel again to be sure          Follow-up: No follow-ups on file.  Raymondo Band, MD [facility] 12/26/2022, 2:39 PM

## 2022-12-26 NOTE — Patient Instructions (Signed)
Will see what these tests here show  I suspect you had acute hepatitis c and your body cleared it    For your partner, look for these tests Hepatitis c antibody -- if positive alone (rna negative) means prior exposure and body cleared it Hepatitis c RNA --  if positive would mean active infection   Hepatitis b surface antigen -- positive means active infection Hep b DNA -- positive means infection

## 2022-12-26 NOTE — Addendum Note (Signed)
Addended by: Harley Alto on: 12/26/2022 03:00 PM   Modules accepted: Orders

## 2022-12-29 LAB — HEPATITIS PANEL, ACUTE
Hep A IgM: NONREACTIVE
Hep B C IgM: NONREACTIVE
Hepatitis B Surface Ag: NONREACTIVE
Hepatitis C Ab: NONREACTIVE

## 2022-12-29 LAB — HEPATITIS C RNA QUANTITATIVE
HCV Quantitative Log: <1.18 log IU/mL
HCV RNA, PCR, QN: <15 IU/mL

## 2022-12-29 LAB — HIV ANTIBODY (ROUTINE TESTING W REFLEX): HIV 1&2 Ab, 4th Generation: NONREACTIVE

## 2023-01-03 ENCOUNTER — Telehealth: Payer: Self-pay

## 2023-01-03 NOTE — Telephone Encounter (Signed)
-----   Message from Raymondo Band, MD sent at 01/02/2023  5:09 PM EDT ----- Please let her know her hepatitis c infection had cleared on its own  She doesn't need treatment. She just can't donate blood from now on  Thanks  No need for id clinic f/u

## 2023-01-09 ENCOUNTER — Encounter: Payer: Self-pay | Admitting: Family Medicine

## 2023-01-09 NOTE — Telephone Encounter (Signed)
Patient called. Reports that she is at work and she will come to tomorrow's appointment.

## 2023-01-09 NOTE — Progress Notes (Signed)
I,Joseline E Rosas,acting as a scribe for Shirlee Latch, MD.,have documented all relevant documentation on the behalf of Shirlee Latch, MD,as directed by  Shirlee Latch, MD while in the presence of Shirlee Latch, MD.   Established patient visit   Patient: Diane Roberts   DOB: 1981/01/15   41 y.o. Female  MRN: [id number] Visit Date: 01/10/2023  Today's healthcare provider: Shirlee Latch, MD   Chief Complaint  Patient presents with   Rash   Subjective    Rash This is a new problem. Episode onset: 3 days ago. The problem is unchanged. The rash is characterized by blistering. It is unknown if there was an exposure to a precipitant. Past treatments include nothing.    Discussed the use of AI scribe software for clinical note transcription with the patient, who gave verbal consent to proceed.  History of Present Illness   The patient, with a history of anal fissures, presents with a new painful rash near the anal region. The rash was noticed after she experienced pain and swelling in the area. The patient also reports a history of intermittent anal pain, which she previously managed with lidocaine gel. The patient has run out of the gel and requests a refill.  In addition to the rash, she reports frequent urination and pressure in the lower abdomen, suggesting a possible urinary tract infection. The patient also mentions a previous prescription for nitroglycerin that was too expensive to fill.       Medications: Outpatient Medications Prior to Visit  Medication Sig   acetaminophen (TYLENOL) 325 MG tablet Take 650 mg by mouth as needed.   fluticasone (FLONASE) 50 MCG/ACT nasal spray Place 2 sprays into both nostrils daily.   metroNIDAZOLE (FLAGYL) 500 MG tablet Take 1 tablet (500 mg total) by mouth 2 (two) times daily. (Patient not taking: Reported on 12/26/2022)   trimethoprim-polymyxin b (POLYTRIM) ophthalmic solution Place 1 drop into both eyes every 4 (four)  hours.   [DISCONTINUED] Lidocaine, Anorectal, 5 % GEL Apply topically BID prn for anal fissureApply topically BID prn for anal fissure   [DISCONTINUED] Nitroglycerin 0.4 % OINT Apply 1 inch (375 mg) ointment intra-anally every 12 hours for anal fissure   No facility-administered medications prior to visit.    Review of Systems  Skin:  Positive for rash.       Objective    BP 118/71 (BP Location: Left Arm, Patient Position: Sitting, Cuff Size: Large)   Pulse 79   Temp 98.7 F (37.1 C) (Oral)   Wt 146 lb (66.2 kg)   LMP  (LMP Unknown)   BMI 25.06 kg/m    Physical Exam  See photo from mychart message 01/09/23  Physical Exam   SKIN: Rash R perinanal region, lesions tender. Fissure at the 6 o'clock position       Results for orders placed or performed in visit on 01/10/23  POCT Urinalysis Dipstick  Result Value Ref Range   Color, UA Yellow    Clarity, UA Clear    Glucose, UA Negative Negative   Bilirubin, UA Negative    Ketones, UA Negative    Spec Grav, UA 1.015 1.010 - 1.025   Blood, UA Negative    pH, UA 6.5 5.0 - 8.0   Protein, UA Negative Negative   Urobilinogen, UA 0.2 0.2 or 1.0 E.U./dL   Nitrite, UA Negative    Leukocytes, UA Negative Negative   Appearance     Odor Positive     Assessment &  Plan     Problem List Items Addressed This Visit       Digestive   Anal fissure   Other Visit Diagnoses     HSV (herpes simplex virus) anogenital infection    -  Primary   Relevant Medications   valACYclovir (VALTREX) 1000 MG tablet   Other Relevant Orders   Herpes simplex virus culture   Screen for STD (sexually transmitted disease)       Relevant Orders   Cervicovaginal ancillary only   Dysuria       Relevant Orders   POCT Urinalysis Dipstick (Completed)   Urine Culture        Assessment and Plan    Anal Fissure: Reports relief with Lidocaine gel, but has run out. Fissure visualized at 6 o'clock position. -Refill Lidocaine gel prescription. High  cost of Nitroglycerin prescription noted. -Discontinue Nitroglycerin due to cost.  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 Discussed the nature of herpes, its latency, and potential for transmission. Swab taken for confirmation. -Start Valtrex twice daily for 10 days.  Possible Urinary Tract Infection: Reports increased urinary frequency and pressure. Urine sample to be collected for testing. -If urine test positive, prescribe appropriate antibiotic.  STD Screening: Patient requests comprehensive STD screening. -Perform comprehensive STD screening. - recent Hepatitis and HIV screening reviewed and will not repeat today        Return if symptoms worsen or fail to improve.      I, Shirlee Latch, MD, have reviewed all documentation for this visit. The documentation on 01/10/23 for the exam, diagnosis, procedures, and orders are all accurate and complete.   Zackeriah Kissler, Marzella Schlein, MD, MPH [facility]

## 2023-01-09 NOTE — Telephone Encounter (Signed)
Can you see if she wants to come in for one of the openings this afternoon?  I don't really want to diagnose her with herpes on Mychart without a conversation.

## 2023-01-10 ENCOUNTER — Ambulatory Visit (INDEPENDENT_AMBULATORY_CARE_PROVIDER_SITE_OTHER): Payer: No Typology Code available for payment source | Admitting: Family Medicine

## 2023-01-10 ENCOUNTER — Other Ambulatory Visit (HOSPITAL_COMMUNITY)
Admission: RE | Admit: 2023-01-10 | Discharge: 2023-01-10 | Disposition: A | Discharge status: Home / Self Care | Payer: No Typology Code available for payment source | Source: Ambulatory Visit | Attending: Family Medicine | Admitting: Family Medicine

## 2023-01-10 ENCOUNTER — Encounter: Payer: Self-pay | Admitting: Family Medicine

## 2023-01-10 VITALS — BP 118/71 | HR 79 | Temp 98.7°F | Wt 146.0 lb

## 2023-01-10 DIAGNOSIS — K602 Anal fissure, unspecified: Secondary | ICD-10-CM | POA: Diagnosis not present

## 2023-01-10 DIAGNOSIS — Z113 Encounter for screening for infections with a predominantly sexual mode of transmission: Secondary | ICD-10-CM | POA: Diagnosis present

## 2023-01-10 DIAGNOSIS — A609 Anogenital herpesviral infection, unspecified: Secondary | ICD-10-CM | POA: Diagnosis not present

## 2023-01-10 DIAGNOSIS — R300 Dysuria: Secondary | ICD-10-CM | POA: Diagnosis not present

## 2023-01-10 LAB — POCT URINALYSIS DIPSTICK
Bilirubin, UA: NEGATIVE
Blood, UA: NEGATIVE
Glucose, UA: NEGATIVE
Ketones, UA: NEGATIVE
Leukocytes, UA: NEGATIVE
Nitrite, UA: NEGATIVE
Odor: POSITIVE
Protein, UA: NEGATIVE
Spec Grav, UA: 1.015 (ref 1.010–1.025)
Urobilinogen, UA: 0.2 E.U./dL
pH, UA: 6.5 (ref 5.0–8.0)

## 2023-01-10 MED ORDER — VALACYCLOVIR HCL 1 G PO TABS: 1000.0000 mg | ORAL_TABLET | Freq: Two times a day (BID) | ORAL | 0 refills | Status: AC

## 2023-01-10 MED ORDER — LIDOCAINE (ANORECTAL) 5 % EX GEL: CUTANEOUS | 1 refills | Status: DC

## 2023-01-12 LAB — URINE CULTURE

## 2023-01-13 LAB — CERVICOVAGINAL ANCILLARY ONLY
Chlamydia: NEGATIVE
Comment: NEGATIVE
Comment: NEGATIVE
Comment: NORMAL
Neisseria Gonorrhea: NEGATIVE
Trichomonas: NEGATIVE

## 2023-01-13 LAB — HERPES SIMPLEX VIRUS CULTURE

## 2023-02-19 ENCOUNTER — Encounter (INDEPENDENT_AMBULATORY_CARE_PROVIDER_SITE_OTHER): Payer: No Typology Code available for payment source | Admitting: Family Medicine

## 2023-02-19 DIAGNOSIS — A609 Anogenital herpesviral infection, unspecified: Secondary | ICD-10-CM

## 2023-02-24 MED ORDER — VALACYCLOVIR HCL 1 G PO TABS: 1000.0000 mg | ORAL_TABLET | Freq: Every day | ORAL | 5 refills | Status: DC

## 2023-02-24 NOTE — Telephone Encounter (Signed)
Please see the MyChart message reply(ies) for my assessment and plan.    This patient gave consent for this Medical Advice Message and is aware that it Wages result in a bill to their insurance company, as well as the possibility of receiving a bill for a co-payment or deductible. They are an established patient, but are not seeking medical advice exclusively about a problem treated during an in person or video visit in the last seven days. I did not recommend an in person or video visit within seven days of my reply.    I spent a total of 6 minutes cumulative time within 7 days through MyChart messaging.  Angela Bacigalupo, MD   

## 2023-05-06 ENCOUNTER — Emergency Department: Payer: No Typology Code available for payment source

## 2023-05-06 ENCOUNTER — Other Ambulatory Visit: Payer: Self-pay

## 2023-05-06 ENCOUNTER — Emergency Department
Admission: EM | Admit: 2023-05-06 | Discharge: 2023-05-07 | Disposition: A | Discharge status: Home / Self Care | Payer: No Typology Code available for payment source | Attending: Emergency Medicine | Admitting: Emergency Medicine

## 2023-05-06 DIAGNOSIS — R079 Chest pain, unspecified: Secondary | ICD-10-CM | POA: Diagnosis present

## 2023-05-06 LAB — COMPREHENSIVE METABOLIC PANEL
ALT: 14 U/L (ref 0–44)
AST: 14 U/L — ABNORMAL LOW (ref 15–41)
Albumin: 3.8 g/dL (ref 3.5–5.0)
Alkaline Phosphatase: 56 U/L (ref 38–126)
Anion gap: 10 (ref 5–15)
BUN: 9 mg/dL (ref 6–20)
CO2: 24 mmol/L (ref 22–32)
Calcium: 8.9 mg/dL (ref 8.9–10.3)
Chloride: 104 mmol/L (ref 98–111)
Creatinine, Ser: 0.66 mg/dL (ref 0.44–1.00)
GFR, Estimated: >60 mL/min (ref >60)
Glucose, Bld: 87 mg/dL (ref 70–99)
Potassium: 3.3 mmol/L — ABNORMAL LOW (ref 3.5–5.1)
Sodium: 138 mmol/L (ref 135–145)
Total Bilirubin: 0.5 mg/dL (ref 0.3–1.2)
Total Protein: 6.4 g/dL — ABNORMAL LOW (ref 6.5–8.1)

## 2023-05-06 LAB — CBC
HCT: 40.7 % (ref 36.0–46.0)
Hemoglobin: 14 g/dL (ref 12.0–15.0)
MCH: 31.7 pg (ref 26.0–34.0)
MCHC: 34.4 g/dL (ref 30.0–36.0)
MCV: 92.3 fL (ref 80.0–100.0)
Platelets: 164 10*3/uL (ref 150–400)
RBC: 4.41 MIL/uL (ref 3.87–5.11)
RDW: 11.3 % — ABNORMAL LOW (ref 11.5–15.5)
WBC: 4.7 10*3/uL (ref 4.0–10.5)
nRBC: 0 % (ref 0.0–0.2)

## 2023-05-06 LAB — D-DIMER, QUANTITATIVE: D-Dimer, Quant: 0.27 ug/mL-FEU (ref 0.00–0.50)

## 2023-05-06 LAB — TROPONIN I (HIGH SENSITIVITY)
Troponin I (High Sensitivity): <2 ng/L (ref <18)
Troponin I (High Sensitivity): <2 ng/L (ref <18)

## 2023-05-06 MED ORDER — ACETAMINOPHEN 500 MG PO TABS
1000.0000 mg | ORAL_TABLET | Freq: Once | ORAL | Status: AC
  Administered 2023-05-06: 1000 mg via ORAL
  Filled 2023-05-06: qty 2

## 2023-05-06 MED ORDER — LIDOCAINE 5 % EX PTCH
1.0000 | MEDICATED_PATCH | CUTANEOUS | Status: DC
  Administered 2023-05-06: 1 via TRANSDERMAL
  Filled 2023-05-06: qty 1

## 2023-05-06 MED ORDER — LIDOCAINE 5 % EX PTCH: 1.0000 | MEDICATED_PATCH | Freq: Two times a day (BID) | CUTANEOUS | 0 refills | Status: AC

## 2023-05-06 MED ORDER — KETOROLAC TROMETHAMINE 30 MG/ML IJ SOLN
30.0000 mg | Freq: Once | INTRAMUSCULAR | Status: AC
  Administered 2023-05-06: 30 mg via INTRAMUSCULAR
  Filled 2023-05-06: qty 1

## 2023-05-06 NOTE — Discharge Instructions (Signed)
Your testing did not show any emergency conditions like heart attack, blood clot, broken ribs, punctured lung.  Take acetaminophen 650 mg and ibuprofen 400 mg every 6 hours for pain.  Take with food. Use pain patch Thank you for choosing [place] for your health care today!  Please see your primary doctor this week for a follow up appointment.   If you have any new, worsening, or unexpected symptoms call your doctor right away or come back to the emergency department for reevaluation.  It was my pleasure to care for you today.   Daneil Dan Modesto Charon, MD

## 2023-05-06 NOTE — ED Notes (Signed)
ED Provider at bedside. 

## 2023-05-06 NOTE — ED Provider Notes (Signed)
[facility] Provider Note    Event Date/Time   First MD Initiated Contact with Patient 05/06/23 2303     (approximate)   History   Chest Pain   HPI  Diane Roberts is a 42 y.o. female   Past medical history of significant past medical history presents emergency department with chest pain.  Over the last 3 days has had sharp left-sided chest pain worsening with movement.  Also worse with deep breaths.  No shortness of breath or respiratory infectious symptoms.  Denies GI or GU complaints.  No fever or chills.  No injuries or obvious inciting event.  NO History of blood clot immobilization and hormone use.  Independent Historian contributed to assessment above: Spouse at bedside to corroborate information past medical history as above     Physical Exam   Triage Vital Signs: ED Triage Vitals  Encounter Vitals Group     BP 05/06/23 1918 104/62     Systolic BP Percentile --      Diastolic BP Percentile --      Pulse Rate 05/06/23 1918 63     Resp 05/06/23 1918 16     Temp 05/06/23 1918 98.2 F (36.8 C)     Temp Source 05/06/23 1918 Oral     SpO2 05/06/23 1918 97 %     Weight 05/06/23 1921 150 lb (68 kg)     Height 05/06/23 1921 5\' 4"  (1.626 m)     Head Circumference --      Peak Flow --      Pain Score 05/06/23 1921 7     Pain Loc --      Pain Education --      Exclude from Growth Chart --     Most recent vital signs: Vitals:   05/06/23 2242 05/06/23 2306  BP: 100/66   Pulse: (!) 55 62  Resp: 17 17  Temp: 97.9 F (36.6 C)   SpO2: 99% 100%    General: Awake, no distress.  CV:  Good peripheral perfusion.  Resp:  Normal effort.  Abd:  No distention.  Other:  Wake alert comfortable with normal vital signs, no tenderness to palpation or skin rash in the area of pain on left chest.  Clear lungs soft nontender abdomen deep palpation all quadrants.   ED Results / Procedures / Treatments   Labs (all labs ordered are listed, but only  abnormal results are displayed) Labs Reviewed  CBC - Abnormal; Notable for the following components:      Result Value   RDW 11.3 (*)    All other components within normal limits  COMPREHENSIVE METABOLIC PANEL - Abnormal; Notable for the following components:   Potassium 3.3 (*)    Total Protein 6.4 (*)    AST 14 (*)    All other components within normal limits  D-DIMER, QUANTITATIVE  TROPONIN I (HIGH SENSITIVITY)  TROPONIN I (HIGH SENSITIVITY)     I ordered and reviewed the above labs they are notable for troponin and D-dimer negative  EKG   ED ECG REPORT I, Pilar Jarvis, the attending physician, personally viewed and interpreted this ECG.   Date: 05/06/2023  EKG Time: 1916  Rate: 63  Rhythm: nsr  Axis: nl  Intervals:non  ST&T Change: no stemi    RADIOLOGY I independently reviewed and interpreted chest x-ray and I see no obvious focality or pneumothorax I also reviewed radiologist's formal read.   PROCEDURES:  Critical Care performed: No  Procedures  MEDICATIONS ORDERED IN ED: Medications  lidocaine (LIDODERM) 5 % 1 patch (1 patch Transdermal Patch Applied 05/06/23 2321)  ketorolac (TORADOL) 30 MG/ML injection 30 mg (30 mg Intramuscular Given 05/06/23 2322)  acetaminophen (TYLENOL) tablet 1,000 mg (1,000 mg Oral Given 05/06/23 2321)    IMPRESSION / MDM / ASSESSMENT AND PLAN / ED COURSE  I reviewed the triage vital signs and the nursing notes.                                Patient's presentation is most consistent with acute presentation with potential threat to life or bodily function.  Differential diagnosis includes, but is not limited to, costochondritis, musculoskeletal pain, pneumothorax, rib fracture, PE, ACS, dissection   The patient is on the cardiac monitor to evaluate for evidence of arrhythmia and/or significant heart rate changes.  MDM:    Most likely costochondritis given worse with movement and pleurisy, however consider ACS or PE though  less likely in this young healthy patient with no cardiac risk factors and low risk for PE.  D-dimer negative, as is EKG and initial troponin.  Given chronicity of symptoms I do not think that she needs a second troponin.  NSAIDs, anticipatory guidance, follow-up with PMD.       FINAL CLINICAL IMPRESSION(S) / ED DIAGNOSES   Final diagnoses:  Nonspecific chest pain     Rx / DC Orders   ED Discharge Orders          Ordered    lidocaine (LIDODERM) 5 %  Every 12 hours        05/06/23 2356             Note:  This document was prepared using Dragon voice recognition software and may include unintentional dictation errors.    Pilar Jarvis, MD 05/06/23 [phone]

## 2023-05-06 NOTE — ED Triage Notes (Signed)
Pt reports central and L sided chest pain that has been intermittent since Saturday. Describes pain as a sharp stabbing. Worse with exertion, inhalation. No relief with rest. Pt denies hx of same. Ambulatory to triage. Alert and oriented following commands. Breathing unlabored speaking in full sentences.

## 2023-05-07 NOTE — ED Notes (Signed)
Provided pt with discharge instructions and education. All of pt questions answered. Pt in possession of all belongings. Pt AAOX4 and stable at time of discharge.Pt ambulated w/ steady gait towards ED exit. Pt accompanied by family member.

## 2023-05-19 ENCOUNTER — Encounter: Payer: Self-pay | Admitting: Family Medicine

## 2023-05-19 ENCOUNTER — Telehealth: Payer: Self-pay | Admitting: Family Medicine

## 2023-05-19 NOTE — Telephone Encounter (Signed)
Patient called regarding Left Knee popping. Apt was scheduled for Wednesday 05/21/2023

## 2023-05-20 ENCOUNTER — Ambulatory Visit
Admission: RE | Admit: 2023-05-20 | Discharge: 2023-05-20 | Disposition: A | Discharge status: Home / Self Care | Payer: No Typology Code available for payment source | Source: Ambulatory Visit | Attending: Family Medicine | Admitting: Family Medicine

## 2023-05-20 ENCOUNTER — Ambulatory Visit (INDEPENDENT_AMBULATORY_CARE_PROVIDER_SITE_OTHER): Payer: No Typology Code available for payment source | Admitting: Family Medicine

## 2023-05-20 ENCOUNTER — Ambulatory Visit
Admission: RE | Admit: 2023-05-20 | Discharge: 2023-05-20 | Disposition: A | Discharge status: Home / Self Care | Payer: No Typology Code available for payment source | Attending: Family Medicine | Admitting: Family Medicine

## 2023-05-20 VITALS — BP 108/68 | HR 65 | Temp 98.2°F | Ht 64.0 in | Wt 149.0 lb

## 2023-05-20 DIAGNOSIS — S83005A Unspecified dislocation of left patella, initial encounter: Secondary | ICD-10-CM

## 2023-05-20 DIAGNOSIS — Z23 Encounter for immunization: Secondary | ICD-10-CM | POA: Diagnosis not present

## 2023-05-20 DIAGNOSIS — S83005D Unspecified dislocation of left patella, subsequent encounter: Secondary | ICD-10-CM

## 2023-05-20 MED ORDER — MELOXICAM 15 MG PO TABS: 15.0000 mg | ORAL_TABLET | Freq: Every day | ORAL | 0 refills | Status: DC

## 2023-05-20 NOTE — Progress Notes (Signed)
Acute Office Visit  Subjective:     Patient ID: Diane Roberts, female    DOB: Jun 25, 1981, 42 y.o.   MRN: [id number]  Chief Complaint  Patient presents with   Knee Pain    Patient has had issues with her knee for over 2 years.  During that time it has gotten progressively worse.  She notes popping, locking, crunching sounds and pain.  She has no known injury.    HPI Discussed the use of AI scribe software for clinical note transcription with the patient, who gave verbal consent to proceed.  History of Present Illness   The patient, with a long-standing history of knee instability, presents with worsening left knee pain and instability. They report that their knee has been 'popping out of place' for many years, which they have been able to manually reduce. However, over the past two years, the frequency of these dislocations has increased to 'ten to twenty times a day.' The patient describes that even minor movements, such as rolling over in bed, can cause the knee to dislocate.  Recently, the patient experienced a particularly painful dislocation upon waking up and rushing to get ready for work. Since then, they have had persistent pain, swelling, and a sensation of instability in the knee. The pain has radiated up the thigh and down the calf. The patient reports that the knee continues to 'pop' with each step, and they are unable to fully bear weight on the affected leg.  The patient has been able to continue working with accommodations, but the persistent pain and instability have significantly impacted their quality of life. They express a desire to seek definitive treatment for their knee condition.       ROS      Objective:    BP 108/68 (BP Location: Left Arm, Patient Position: Sitting, Cuff Size: Normal)   Pulse 65   Temp 98.2 F (36.8 C) (Oral)   Ht 5\' 4"  (1.626 m)   Wt 149 lb (67.6 kg)   LMP  (LMP Unknown)   SpO2 100%   BMI 25.58 kg/m    Physical  Exam  Physical Exam   MUSCULOSKELETAL: Effusion in left knee with swelling and pain.       No results found for any visits on 05/20/23.      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 Noted effusion on examination. -Order knee X-ray to assess for any associated injury. -Refer to Orthopedics urgently for further evaluation and management. -Start Meloxicam for anti-inflammatory effect and pain control. -Advise patient to rest the knee and apply ice frequently (20 minutes on, 20 minutes off).        Meds ordered this encounter  Medications   meloxicam (MOBIC) 15 MG tablet    Sig: Take 1 tablet (15 mg total) by mouth daily.    Dispense:  30 tablet    Refill:  0    No follow-ups on  file.  Shirlee Latch, MD

## 2023-05-20 NOTE — Telephone Encounter (Signed)
Ok to place ortho referral for knee pain

## 2023-05-20 NOTE — Telephone Encounter (Signed)
Patient was seen today and referral was placed.

## 2023-05-21 ENCOUNTER — Ambulatory Visit: Payer: No Typology Code available for payment source | Admitting: Family Medicine

## 2023-11-17 ENCOUNTER — Encounter: Payer: Self-pay | Admitting: Family Medicine

## 2023-12-01 ENCOUNTER — Ambulatory Visit
Admission: RE | Admit: 2023-12-01 | Discharge: 2023-12-01 | Disposition: A | Discharge status: Home / Self Care | Source: Ambulatory Visit | Attending: Physician Assistant | Admitting: Physician Assistant

## 2023-12-01 VITALS — BP 112/78 | HR 71 | Temp 97.9°F | Resp 15

## 2023-12-01 DIAGNOSIS — N3000 Acute cystitis without hematuria: Secondary | ICD-10-CM

## 2023-12-01 DIAGNOSIS — R300 Dysuria: Secondary | ICD-10-CM | POA: Diagnosis not present

## 2023-12-01 LAB — URINALYSIS, W/ REFLEX TO CULTURE (INFECTION SUSPECTED)
Bilirubin Urine: NEGATIVE
Glucose, UA: NEGATIVE mg/dL
Hgb urine dipstick: NEGATIVE
Ketones, ur: NEGATIVE mg/dL
Nitrite: NEGATIVE
Specific Gravity, Urine: 1.02 (ref 1.005–1.030)
WBC, UA: >50 WBC/hpf (ref 0–5)
pH: 6.5 (ref 5.0–8.0)

## 2023-12-01 MED ORDER — NITROFURANTOIN MONOHYD MACRO 100 MG PO CAPS: 100.0000 mg | ORAL_CAPSULE | Freq: Two times a day (BID) | ORAL | 0 refills | Status: DC

## 2023-12-01 NOTE — Discharge Instructions (Signed)

## 2023-12-01 NOTE — ED Triage Notes (Signed)
 Urinary frequency and dysuria since sat

## 2023-12-01 NOTE — ED Provider Notes (Signed)
 [facility]    CSN: [id number] Arrival date & time: 12/01/23  1143      History   Chief Complaint Chief Complaint  Patient presents with   Urinary Frequency    Feels like I'm getting a uti - Entered by patient    HPI Diane Roberts is a 43 y.o. female presenting for 2 days of urinary frequency/urgency and dysuria.  Reports a discomfort with urination but says it is not really burning.  She says it feels like bladder spasms.  Denies fever, fatigue, nausea/vomiting, flank pain, vaginal discharge and does not report any concern for STIs.  Not taking any OTC meds.  HPI  Past Medical History:  Diagnosis Date   Abnormal Pap smear of cervix    Anemia    after delivery   Complication of anesthesia 2021   Extreme nausea in PACU after hysterectomy. None with prior surgeries.   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   Fatigue 11/15/2022   Recurrent UTI 04/19/2022   Vaginal discharge 11/15/2021   Thyroid  nodule 06/22/2020   Dysplasia of cervix, low grade (CIN 1) 03/21/2020   Adjustment disorder with anxious mood 01/20/2020   Anal fissure 01/20/2020   Chronic pain of left knee 08/30/2019   Synovial cyst of left popliteal space 08/30/2019   Pelvic pain 04/21/2018   Uterine leiomyoma 04/21/2018   Leukorrhea 04/21/2018   Frequent headaches 09/09/2013    Past Surgical History:  Procedure Laterality Date   ABLATION     APPENDECTOMY  2012   BILATERAL SALPINGECTOMY Bilateral 07/24/2020   Procedure: BILATERAL SALPINGECTOMY;  Surgeon: Carolynn Citrin, MD;  Location: [facility] ORS;  Service: Gynecology;  Laterality: Bilateral;   BREAST BIOPSY Left 11/21/2016   us  bx, PASH   CERVIX REMOVAL     CHOLECYSTECTOMY  2002   EXCISION OF BREAST BIOPSY Left 05/30/2021   Procedure: EXCISION  OF BREAST BIOPSY;  Surgeon: Eldred Grego, MD;  Location: [facility] ORS;  Service: General;  Laterality: Left;   LAPAROSCOPIC LYSIS OF ADHESIONS  05/11/2018   Procedure: LAPAROSCOPIC LYSIS OF ADHESIONS;  Surgeon: Colan Dash, MD;  Location: [facility] OR[facility];  Service: Gynecology;;   LAPAROSCOPY N/A 05/11/2018   Procedure: LAPAROSCOPY DIAGNOSTIC WITH BIOPSIES;  Surgeon: Colan Dash, MD;  Location: [facility] OR[facility];  Service: Gynecology;  Laterality: N/A;   OVARIAN CYST REMOVAL     TONSILLECTOMY  2010   VAGINAL HYSTERECTOMY N/A 07/24/2020   Procedure: HYSTERECTOMY VAGINAL;  Surgeon: Schermerhorn, Joselyn Nicely, MD;  Location: [facility] ORS;  Service: Gynecology;  Laterality: N/A;    OB History     Gravida  2   Para  2   Term  2   Preterm      AB      Living  2      SAB      IAB      Ectopic      Multiple      Live Births  2            Home Medications    Prior to Admission medications   Medication Sig Start Date End Date Taking? Authorizing Provider  nitrofurantoin , macrocrystal-monohydrate, (MACROBID ) 100 MG capsule Take 1 capsule (100 mg total) by mouth 2 (two)  times daily. 12/01/23  Yes Floydene Hy, PA-C  acetaminophen  (TYLENOL ) 325 MG tablet Take 650 mg by mouth as needed.    [provider]  fluticasone  (FLONASE ) 50 MCG/ACT nasal spray Place 2 sprays into both nostrils daily. Patient not taking: Reported on 05/20/2023 11/21/22   Ostwalt, Janna, PA-C  lidocaine  (LIDODERM ) 5 % Place 1 patch onto the skin every 12 (twelve) hours. Remove & Discard patch within 12 hours or as directed by MD Patient not taking: Reported on 05/20/2023 05/06/23 05/05/24  Buell Carmin, MD  Lidocaine , Anorectal, 5 % GEL Apply topically BID prn for anal fissureApply topically BID prn for anal fissure 01/10/23   Mazie Speed, MD  meloxicam  (MOBIC ) 15 MG tablet Take 1 tablet (15 mg total) by mouth daily. 05/20/23   Mazie Speed, MD  metroNIDAZOLE  (FLAGYL ) 500 MG tablet  Take 1 tablet (500 mg total) by mouth 2 (two) times daily. Patient not taking: Reported on 12/26/2022 12/09/22   Bacigalupo, Angela M, MD  trimethoprim -polymyxin b  (POLYTRIM ) ophthalmic solution Place 1 drop into both eyes every 4 (four) hours. Patient not taking: Reported on 05/20/2023 11/21/22   Ostwalt, Janna, PA-C  valACYclovir  (VALTREX ) 1000 MG tablet Take 1 tablet (1,000 mg total) by mouth daily. 02/24/23   Mazie Speed, MD    Family History Family History  Problem Relation Age of Onset   Hypertension Mother    Kidney disease Mother    Hyperlipidemia Mother    Thyroid  disease Mother        thyroid  cancer   COPD Mother    Osteoporosis Mother    Hypertension Father    Clotting disorder Maternal Grandmother        reports she died of a blood clot   COPD Maternal Grandfather    Gout Maternal Grandfather    Heart failure Maternal Grandfather    Heart failure Paternal Grandfather    Hypertension Paternal Grandfather    COPD Paternal Grandfather    Breast cancer Neg Hx    Ovarian cancer Neg Hx    Colon cancer Neg Hx     Social History Social History   Tobacco Use   Smoking status: Every Day    Types: E-cigarettes   Smokeless tobacco: Never   Tobacco comments:    Vapes now.  Vaping Use   Vaping status: Every Day   Substances: Nicotine, Flavoring  Substance Use Topics   Alcohol use: Yes    Alcohol/week: 1.0 standard drink of alcohol    Types: 1 Cans of beer per week    Comment: occasional   Drug use: No     Allergies   Patient has no known allergies.   Review of Systems Review of Systems  Constitutional:  Negative for chills, fatigue and fever.  Gastrointestinal:  Negative for abdominal pain, diarrhea, nausea and vomiting.  Genitourinary:  Positive for dysuria, frequency and urgency. Negative for decreased urine volume, flank pain, hematuria, pelvic pain, vaginal bleeding, vaginal discharge and vaginal pain.  Musculoskeletal:  Negative for back pain.   Skin:  Negative for rash.     Physical Exam Triage Vital Signs ED Triage Vitals  Encounter Vitals Group     BP 12/01/23 1153 112/78     Systolic BP Percentile --      Diastolic BP Percentile --      Pulse Rate 12/01/23 1153 71     Resp 12/01/23 1153 15     Temp 12/01/23 1153 97.9 F (36.6 C)  Temp Source 12/01/23 1153 Oral     SpO2 12/01/23 1153 97 %     Weight --      Height --      Head Circumference --      Peak Flow --      Pain Score 12/01/23 1152 0     Pain Loc --      Pain Education --      Exclude from Growth Chart --    No data found.  Updated Vital Signs BP 112/78 (BP Location: Right Arm)   Pulse 71   Temp 97.9 F (36.6 C) (Oral)   Resp 15   LMP  (LMP Unknown)   SpO2 97%    Physical Exam Vitals and nursing note reviewed.  Constitutional:      General: She is not in acute distress.    Appearance: Normal appearance. She is not ill-appearing or toxic-appearing.  HENT:     Head: Normocephalic and atraumatic.  Eyes:     General: No scleral icterus.       Right eye: No discharge.        Left eye: No discharge.     Conjunctiva/sclera: Conjunctivae normal.  Cardiovascular:     Rate and Rhythm: Normal rate and regular rhythm.     Heart sounds: Normal heart sounds.  Pulmonary:     Effort: Pulmonary effort is normal. No respiratory distress.     Breath sounds: Normal breath sounds.  Abdominal:     Palpations: Abdomen is soft.     Tenderness: There is no abdominal tenderness. There is no right CVA tenderness or left CVA tenderness.  Musculoskeletal:     Cervical back: Neck supple.  Skin:    General: Skin is dry.  Neurological:     General: No focal deficit present.     Mental Status: She is alert. Mental status is at baseline.     Motor: No weakness.     Gait: Gait normal.  Psychiatric:        Mood and Affect: Mood normal.        Behavior: Behavior normal.      UC Treatments / Results  Labs (all labs ordered are listed, but only abnormal  results are displayed) Labs Reviewed  URINALYSIS, W/ REFLEX TO CULTURE (INFECTION SUSPECTED) - Abnormal; Notable for the following components:      Result Value   APPearance HAZY (*)    Protein, ur TRACE (*)    Leukocytes,Ua SMALL (*)    Non Squamous Epithelial PRESENT (*)    Bacteria, UA MANY (*)    All other components within normal limits  URINE CULTURE    EKG   Radiology No results found.  Procedures Procedures (including critical care time)  Medications Ordered in UC Medications - No data to display  Initial Impression / Assessment and Plan / UC Course  I have reviewed the triage vital signs and the nursing notes.  Pertinent labs & imaging results that were available during my care of the patient were reviewed by me and considered in my medical decision making (see chart for details).   43 year old female presents for 2-day history of dysuria, frequency and urgency.  Vital stable and normal.  Overall well-appearing.  No abdominal tenderness or CVA tenderness.  Urinalysis obtained.  UA shows hazy urine with trace protein, some leukocytes, many bacteria and cons white blood cells.  Reflexed to culture.  Acute urinary tract infection.  Treating with Macrobid .  Will amend treatment based  on culture if necessary.  Reviewed supportive care and return guidelines with patient.   Final Clinical Impressions(s) / UC Diagnoses   Final diagnoses:  Acute cystitis without hematuria  Dysuria     Discharge Instructions      UTI: Based on either symptoms or urinalysis, you may have a urinary tract infection. We will send the urine for culture and call with results in a few days. Begin antibiotics at this time. Your symptoms should be much improved over the next 2-3 days. Increase rest and fluid intake. If for some reason symptoms are worsening or not improving after a couple of days or the urine culture determines the antibiotics you are taking will not treat the infection, the  antibiotics may be changed. Return or go to ER for fever, back pain, worsening urinary pain, discharge, increased blood in urine. May take Tylenol  or Motrin OTC for pain relief or consider AZO if no contraindications      ED Prescriptions     Medication Sig Dispense Auth. Provider   nitrofurantoin , macrocrystal-monohydrate, (MACROBID ) 100 MG capsule Take 1 capsule (100 mg total) by mouth 2 (two) times daily. 10 capsule Floydene Hy, PA-C      PDMP not reviewed this encounter.   Floydene Hy, PA-C 12/01/23 1235

## 2023-12-03 LAB — URINE CULTURE: Culture: >=100000 — AB

## 2024-02-24 ENCOUNTER — Ambulatory Visit: Payer: Self-pay

## 2024-02-24 NOTE — Telephone Encounter (Signed)
 FYI Only or Action Required?: Action required by provider: request for appointment.  Patient was last seen in primary care on 05/20/2023 by Myrla Jon HERO, MD.  Called Nurse Triage reporting Dysuria.  Symptoms began Sunday.  Interventions attempted: Nothing.  Symptoms are: gradually worsening.  Triage Disposition: See Physician Within 24 Hours  Patient/caregiver understands and will follow disposition?: Yes     Copied from CRM [id number]. Topic: Clinical - Red Word Triage >> Feb 24, 2024  3:24 PM Mia F wrote: Red Word that prompted transfer to Nurse Triage: Pt called to schedule an appt for a possible UTI. She says she is just having some pressure no bleeding or burning just yet. No appts available until August. Calling NT   ----------------------------------------------------------------------- From previous Reason for Contact - Scheduling: Patient/patient representative is calling to schedule an appointment. Refer to attachments for appointment information. Reason for Disposition  All other patients with painful urination  (Exception: [1] EITHER frequency or urgency AND [2] has on-call doctor.)    Referred to urgent care  Answer Assessment - Initial Assessment Questions 1. SEVERITY: How bad is the pain?  (e.g., Scale 1-10; mild, moderate, or severe)     Mild to moderate pressure with urination 2. FREQUENCY: How many times have you had painful urination today?      Increase urge to urinate 3. PATTERN: Is pain present every time you urinate or just sometimes?      constant 4. ONSET: When did the painful urination start?      Sunday 5. FEVER: Do you have a fever? If Yes, ask: What is your temperature, how was it measured, and when did it start?     no 6. PAST UTI: Have you had a urine infection before? If Yes, ask: When was the last time? and What happened that time?      Yes - Abx 7. CAUSE: What do you think is causing the painful urination?  (e.g.,  UTI, scratch, Herpes sore)     UTI 8. OTHER SYMPTOMS: Do you have any other symptoms? (e.g., blood in urine, flank pain, genital sores, urgency, vaginal discharge)     no 9. PREGNANCY: Is there any chance you are pregnant? When was your last menstrual period?     Na  No appts available with PCP or  other providers until August with the exception of 7/17 @8am  - this was not a good date/time for patient therefore recommended urgent care: pt verbalized understanding and stated will go.  Protocols used: Urination Pain - Female-A-AH

## 2024-02-25 NOTE — Telephone Encounter (Signed)
 Noted

## 2024-05-10 ENCOUNTER — Ambulatory Visit: Payer: Self-pay

## 2024-05-10 NOTE — Telephone Encounter (Signed)
 Ok to find her an in office appt as she refused ED.  If worsens, should go to ED,

## 2024-05-10 NOTE — Telephone Encounter (Signed)
 FYI Only or Action Required?: FYI only for provider.  Patient was last seen in primary care on 05/20/2023 by Diane Jon HERO, MD.  Called Nurse Triage reporting Chest Pain.  Symptoms began today.  Interventions attempted: Nothing.  Symptoms are: unchanged.  Triage Disposition: Go to ED Now (Notify PCP)  Patient/caregiver understands and will follow disposition?: No, refuses disposition        Copied from CRM [id number]. Topic: Clinical - Red Word Triage >> May 10, 2024  1:58 PM Charlet Roberts wrote: Red Word that prompted transfer to Nurse Triage: Patient is calling about chest pain on the left side of her chest and it is going down in her arm it started this morning but did happen last week. [place]       Reason for Disposition  Pain also in shoulder(s) or arm(s) or jaw  (Exception: Pain is clearly made worse by movement.)  Answer Assessment - Initial Assessment Questions Patient refusing ED. CAL notified of refusal.    1. LOCATION: Where does it hurt?       Left sided chest  2. RADIATION: Does the pain go anywhere else? (e.g., into neck, jaw, arms, back)     Radiates down left arm  3. ONSET: When did the chest pain begin? (Minutes, hours or days)      This morning  4. PATTERN: Does the pain come and go, or has it been constant since it started?  Does it get worse with exertion?      Constant   5. DURATION: How long does it last (e.g., seconds, minutes, hours)     Constant  6. SEVERITY: How bad is the pain?  (e.g., Scale 1-10; mild, moderate, or severe)     8/10 7. CARDIAC RISK FACTORS: Do you have any history of heart problems or risk factors for heart disease? (e.g., angina, prior heart attack; diabetes, high blood pressure, high cholesterol, smoker, or strong family history of heart disease)     No 8. PULMONARY RISK FACTORS: Do you have any history of lung disease?  (e.g., blood clots in lung, asthma, emphysema, birth control pills)      No 9. CAUSE: What do you think is causing the chest pain?     Unsure, possibly due to anxiety  10. OTHER SYMPTOMS: Do you have any other symptoms? (e.g., dizziness, nausea, vomiting, sweating, fever, difficulty breathing, cough)       No  Protocols used: Chest Pain-A-AH

## 2024-05-13 ENCOUNTER — Encounter: Payer: Self-pay | Admitting: Family Medicine

## 2024-05-13 ENCOUNTER — Ambulatory Visit: Admitting: Family Medicine

## 2024-05-13 VITALS — BP 95/64 | HR 71 | Ht 64.0 in | Wt 156.9 lb

## 2024-05-13 DIAGNOSIS — F410 Panic disorder [episodic paroxysmal anxiety] without agoraphobia: Secondary | ICD-10-CM

## 2024-05-13 DIAGNOSIS — Z23 Encounter for immunization: Secondary | ICD-10-CM

## 2024-05-13 DIAGNOSIS — F418 Other specified anxiety disorders: Secondary | ICD-10-CM | POA: Diagnosis not present

## 2024-05-13 MED ORDER — HYDROXYZINE PAMOATE 25 MG PO CAPS: 25.0000 mg | ORAL_CAPSULE | Freq: Three times a day (TID) | ORAL | 3 refills | Status: AC | PRN

## 2024-05-13 MED ORDER — LIDOCAINE (ANORECTAL) 5 % EX GEL: CUTANEOUS | 1 refills | Status: AC

## 2024-05-13 MED ORDER — VALACYCLOVIR HCL 1 G PO TABS: 1000.0000 mg | ORAL_TABLET | Freq: Every day | ORAL | 5 refills | Status: AC

## 2024-05-13 NOTE — Progress Notes (Signed)
 Established patient visit   Patient: Diane Roberts   DOB: 05/17/1981   43 y.o. Female  MRN: [id number] Visit Date: 05/13/2024  Today's healthcare provider: Jon Eva, MD   Chief Complaint  Patient presents with   Anxiety    Pt reports she believes stress is causing things to be worse. Reports issue with chest pain Monday after incident that she did not like. Previously took rx and thinks it is time to go back on medications.     Subjective    Anxiety     HPI     Anxiety    Additional comments: Pt reports she believes stress is causing things to be worse. Reports issue with chest pain Monday after incident that she did not like. Previously took rx and thinks it is time to go back on medications.        Last edited by Lilian Fitzpatrick, CMA on 05/13/2024  4:19 PM.       Discussed the use of AI scribe software for clinical note transcription with the patient, who gave verbal consent to proceed.  History of Present Illness   Diane Roberts is a 43 year old female who presents with anxiety and panic attacks.  She experiences anxiety and panic attacks a couple of times a month, often triggered by stressful events. On Monday, she had sharp chest pains radiating to her arm, exacerbated by neck movement, lasting until today.  She feels sleepy often despite getting eight hours of sleep, attributing this to her busy schedule, which includes work and caring for her stepchildren and son. Her social history includes significant stressors, such as her mother's recent health scare and marital issues. Her husband is a source of stress, noting his infidelity, which has contributed to her anxiety.  She recalls taking hydroxyzine in the past for stress-related symptoms, which she took three times a day as needed. She is currently using lidocaine  cream for fissures and Valtrex , and she is concerned about refills for these medications.          05/13/2024    4:21 PM  01/20/2020   11:57 AM  GAD 7 : Generalized Anxiety Score  Nervous, Anxious, on Edge 1 1  Control/stop worrying 1 1  Worry too much - different things 1 1  Trouble relaxing 0 0  Restless 0 0  Easily annoyed or irritable 0 1  Afraid - awful might happen 0 1  Total GAD 7 Score 3 5  Anxiety Difficulty  Not difficult at all       05/13/2024    4:20 PM 12/26/2022    2:29 PM 12/26/2022    8:23 AM 12/05/2022    1:07 PM 11/15/2022    9:43 AM  Depression screen PHQ 2/9  Decreased Interest 0 0 0 0 0  Down, Depressed, Hopeless 0 0 0 0 0  PHQ - 2 Score 0 0 0 0 0  Altered sleeping 1  0 1 0  Tired, decreased energy 1  2 1 2   Change in appetite 0  0 0 0  Feeling bad or failure about yourself  0  0 0 0  Trouble concentrating 0  0 0 0  Moving slowly or fidgety/restless 0  0 0 0  Suicidal thoughts 0  0 0 0  PHQ-9 Score 2  2 2 2   Difficult doing work/chores Not difficult at all  Not difficult at all Not difficult at all Not difficult at all  Medications: Outpatient Medications Prior to Visit  Medication Sig   acetaminophen  (TYLENOL ) 325 MG tablet Take 650 mg by mouth as needed.   [DISCONTINUED] Lidocaine , Anorectal, 5 % GEL Apply topically BID prn for anal fissureApply topically BID prn for anal fissure   [DISCONTINUED] valACYclovir  (VALTREX ) 1000 MG tablet Take 1 tablet (1,000 mg total) by mouth daily.   [DISCONTINUED] fluticasone  (FLONASE ) 50 MCG/ACT nasal spray Place 2 sprays into both nostrils daily. (Patient not taking: Reported on 05/20/2023)   [DISCONTINUED] meloxicam  (MOBIC ) 15 MG tablet Take 1 tablet (15 mg total) by mouth daily. (Patient not taking: Reported on 05/13/2024)   [DISCONTINUED] metroNIDAZOLE  (FLAGYL ) 500 MG tablet Take 1 tablet (500 mg total) by mouth 2 (two) times daily. (Patient not taking: Reported on 05/13/2024)   [DISCONTINUED] nitrofurantoin , macrocrystal-monohydrate, (MACROBID ) 100 MG capsule Take 1 capsule (100 mg total) by mouth 2 (two) times daily. (Patient not  taking: Reported on 05/13/2024)   [DISCONTINUED] trimethoprim -polymyxin b  (POLYTRIM ) ophthalmic solution Place 1 drop into both eyes every 4 (four) hours. (Patient not taking: Reported on 05/20/2023)   No facility-administered medications prior to visit.    Review of Systems     Objective    BP 95/64 (BP Location: Left Arm, Patient Position: Sitting, Cuff Size: Normal)   Pulse 71   Ht 5' 4 (1.626 m)   Wt 156 lb 14.4 oz (71.2 kg)   LMP  (LMP Unknown)   SpO2 99%   BMI 26.93 kg/m    Physical Exam Vitals reviewed.  Constitutional:      General: She is not in acute distress.    Appearance: She is well-developed.  HENT:     Head: Normocephalic and atraumatic.  Eyes:     General: No scleral icterus.    Conjunctiva/sclera: Conjunctivae normal.  Cardiovascular:     Rate and Rhythm: Normal rate and regular rhythm.  Pulmonary:     Effort: Pulmonary effort is normal. No respiratory distress.  Skin:    General: Skin is warm and dry.     Findings: No rash.  Neurological:     Mental Status: She is alert and oriented to person, place, and time.  Psychiatric:        Behavior: Behavior normal.      No results found for any visits on 05/13/24.  Assessment & Plan     Problem List Items Addressed This Visit       Other   Situational anxiety   Relevant Medications   hydrOXYzine (VISTARIL) 25 MG capsule   Panic attacks - Primary   Relevant Medications   hydrOXYzine (VISTARIL) 25 MG capsule   Other Visit Diagnoses       Immunization due       Relevant Orders   Flu vaccine trivalent PF, 6mos and older(Flulaval,Afluria,Fluarix,Fluzone)   Pneumococcal conjugate vaccine 20-valent           Situational anxiety with panic attacks Intermittent episodes of anxiety and panic attacks triggered by stress, with symptoms including sharp chest pain radiating to the arm and neck discomfort. Anxiety score is not severe and symptoms are situational. - Prescribe hydroxyzine to be taken  up to three times a day as needed for acute anxiety episodes. - Discuss potential side effect of drowsiness. - Encourage therapy to address underlying stressors and provide coping tools for panic attacks.  Genital herpes simplex infection Requires ongoing management with Valtrex . - Refill Valtrex  prescription.  Anal fissure Requires management with lidocaine  cream. - Refill lidocaine  cream prescription.  General Health Maintenance Due for a physical examination. Last physical was in April 2024. Discussed flu vaccination and mammogram scheduling. - Administer flu vaccine. - Schedule physical examination in early 2026. - Plan to discuss mammogram at the time of the physical examination.       Return in about 3 months (around 08/13/2024).       Jon Eva, MD  [facility] [phone] (phone) [phone] (fax)  [facility]

## 2024-05-24 ENCOUNTER — Encounter: Payer: Self-pay | Admitting: Family Medicine

## 2024-06-03 ENCOUNTER — Encounter: Payer: Self-pay | Admitting: Family Medicine

## 2024-08-19 ENCOUNTER — Encounter: Payer: Self-pay | Admitting: Family Medicine

## 2024-08-19 ENCOUNTER — Ambulatory Visit: Admitting: Family Medicine

## 2024-08-19 ENCOUNTER — Ambulatory Visit: Attending: Family Medicine

## 2024-08-19 VITALS — BP 99/65 | HR 77 | Resp 14 | Ht 64.0 in | Wt 163.5 lb

## 2024-08-19 DIAGNOSIS — Z789 Other specified health status: Secondary | ICD-10-CM | POA: Diagnosis not present

## 2024-08-19 DIAGNOSIS — Z1231 Encounter for screening mammogram for malignant neoplasm of breast: Secondary | ICD-10-CM

## 2024-08-19 DIAGNOSIS — F418 Other specified anxiety disorders: Secondary | ICD-10-CM

## 2024-08-19 DIAGNOSIS — R002 Palpitations: Secondary | ICD-10-CM | POA: Diagnosis not present

## 2024-08-19 DIAGNOSIS — R0789 Other chest pain: Secondary | ICD-10-CM | POA: Diagnosis not present

## 2024-08-19 DIAGNOSIS — F410 Panic disorder [episodic paroxysmal anxiety] without agoraphobia: Secondary | ICD-10-CM | POA: Diagnosis not present

## 2024-08-19 NOTE — Patient Instructions (Signed)

## 2024-08-19 NOTE — Progress Notes (Signed)
 "     Established patient visit   Patient: Diane Roberts   DOB: [birth year]   44 y.o. Female  MRN: [id number] Visit Date: 08/19/2024  Today's healthcare provider: Jon Eva, MD   Chief Complaint  Patient presents with   Medical Management of Chronic Issues    3 month f/u   Subjective    HPI HPI     Medical Management of Chronic Issues    Additional comments: 3 month f/u      Last edited by Wilfred Hargis RAMAN, CMA on 08/19/2024  2:49 PM.       Discussed the use of AI scribe software for clinical note transcription with the patient, who gave verbal consent to proceed.  History of Present Illness   Diane Roberts is a 44 year old female who presents with recurrent chest pain and numbness in the arms.  She describes focal chest pain that recurs multiple times daily, including at rest, with numbness that radiates down her arm into her hands. She had similar symptoms 4-5 years ago and had a cardiac workup, including rhythm monitoring, which was overall reassuring except for one episode of fast heart rate.  She takes hydroxyzine  for stress but it has not helped the chest pain or anxiety. She does not feel highly anxious overall. Hand numbness sometimes improves with repositioning, such as straightening her arm while lying in bed.  She notes muscular tightness in her shoulders and denies neck pain.       Medications: Show/hide medication list[1]  Review of Systems     Objective    BP 99/65   Pulse 77   Resp 14   Ht 5' 4 (1.626 m)   Wt 163 lb 8 oz (74.2 kg)   LMP  (LMP Unknown)   SpO2 100%   BMI 28.06 kg/m    Physical Exam Vitals reviewed.  Constitutional:      General: She is not in acute distress.    Appearance: Normal appearance. She is well-developed. She is not diaphoretic.  HENT:     Head: Normocephalic and atraumatic.  Eyes:     General: No scleral icterus.    Conjunctiva/sclera: Conjunctivae normal.  Neck:     Thyroid : No thyromegaly.   Cardiovascular:     Rate and Rhythm: Normal rate and regular rhythm.     Pulses: Normal pulses.     Heart sounds: Normal heart sounds. No murmur heard. Pulmonary:     Effort: No respiratory distress.     Breath sounds: Normal breath sounds. No wheezing, rhonchi or rales.  Musculoskeletal:     Cervical back: Neck supple.     Right lower leg: No edema.     Left lower leg: No edema.  Lymphadenopathy:     Cervical: No cervical adenopathy.  Skin:    General: Skin is warm and dry.  Neurological:     Mental Status: She is alert and oriented to person, place, and time. Mental status is at baseline.     Cranial Nerves: No cranial nerve deficit.     Sensory: No sensory deficit.     Motor: No weakness.     Gait: Gait normal.  Psychiatric:        Mood and Affect: Mood normal.        Behavior: Behavior normal.      No results found for any visits on 08/19/24.  Assessment & Plan     Problem List Items Addressed This Visit  Other   Situational anxiety   Panic attacks   Other Visit Diagnoses       Palpitations    -  Primary   Relevant Orders   CBC with Differential/Platelet   LONG TERM MONITOR (3-14 DAYS)   Comprehensive metabolic panel with GFR   TSH   Lipid panel     Other chest pain       Relevant Orders   Lipid panel     Breast cancer screening by mammogram       Relevant Orders   MM 3D SCREENING MAMMOGRAM BILATERAL BREAST     Hepatitis B vaccination status unknown       Relevant Orders   Hepatitis B Surface AntiBODY          Chest pain and palpitations Intermittent chest pain and palpitations with numbness radiating down the arm, occurring multiple times daily, not stress-related. Previous cardiac evaluation five years ago showed no significant findings. Differential includes cardiac arrhythmia, electrolyte abnormalities, anemia, thyroid  dysfunction, and muscular tightness. - Ordered cardiac monitoring patch for two weeks to assess for arrhythmias. -  Ordered laboratory tests to evaluate electrolytes, anemia, and thyroid  function. - Ordered cholesterol test.  Anxiety and panic disorder Anxiety and panic disorder with previous use of hydroxyzine  as needed, which was ineffective for chest pain. Current anxiety levels are not significantly elevated. - Will consider alternative medications for anxiety if cardiac evaluation is unremarkable.  Trapezius myalgia Muscular tightness in trapezius muscles, possibly contributing to numbness and tingling in the arm. Symptoms may be exacerbated by prolonged sitting and desk work. - Recommended exercises and stretches for trapezius muscles to alleviate tightness and associated symptoms.  General health maintenance Due for a screening mammogram. Previous biopsy and surgery for a cyst, with no current palpable mass. - Ordered screening mammogram.        Return in about 1 year (around 08/19/2025) for CPE.       Jon Eva, MD  [facility] [phone] (phone) [phone] (fax)  [facility]     [1]  Outpatient Medications Prior to Visit  Medication Sig   acetaminophen  (TYLENOL ) 325 MG tablet Take 650 mg by mouth as needed.   hydrOXYzine  (VISTARIL ) 25 MG capsule Take 1 capsule (25 mg total) by mouth every 8 (eight) hours as needed for anxiety.   Lidocaine , Anorectal, 5 % GEL Apply topically BID prn for anal fissureApply topically BID prn for anal fissure   valACYclovir  (VALTREX ) 1000 MG tablet Take 1 tablet (1,000 mg total) by mouth daily.   No facility-administered medications prior to visit.   "

## 2024-08-20 LAB — COMPREHENSIVE METABOLIC PANEL WITH GFR
ALT: 13 IU/L (ref 0–32)
AST: 15 IU/L (ref 0–40)
Albumin: 4.2 g/dL (ref 3.9–4.9)
Alkaline Phosphatase: 66 IU/L (ref 41–116)
BUN/Creatinine Ratio: 15 (ref 9–23)
BUN: 13 mg/dL (ref 6–24)
Bilirubin Total: 0.3 mg/dL (ref 0.0–1.2)
CO2: 23 mmol/L (ref 20–29)
Calcium: 9.1 mg/dL (ref 8.7–10.2)
Chloride: 101 mmol/L (ref 96–106)
Creatinine, Ser: 0.88 mg/dL (ref 0.57–1.00)
Globulin, Total: 2 g/dL (ref 1.5–4.5)
Glucose: 93 mg/dL (ref 70–99)
Potassium: 4.2 mmol/L (ref 3.5–5.2)
Sodium: 137 mmol/L (ref 134–144)
Total Protein: 6.2 g/dL (ref 6.0–8.5)
eGFR: 84 mL/min/1.73

## 2024-08-20 LAB — CBC WITH DIFFERENTIAL/PLATELET
Basophils Absolute: 0.1 x10E3/uL (ref 0.0–0.2)
Basos: 1 %
EOS (ABSOLUTE): 0 x10E3/uL (ref 0.0–0.4)
Eos: 0 %
Hematocrit: 43.3 % (ref 34.0–46.6)
Hemoglobin: 14.9 g/dL (ref 11.1–15.9)
Immature Grans (Abs): 0 x10E3/uL (ref 0.0–0.1)
Immature Granulocytes: 0 %
Lymphocytes Absolute: 1.8 x10E3/uL (ref 0.7–3.1)
Lymphs: 26 %
MCH: 31.8 pg (ref 26.6–33.0)
MCHC: 34.4 g/dL (ref 31.5–35.7)
MCV: 92 fL (ref 79–97)
Monocytes Absolute: 0.6 x10E3/uL (ref 0.1–0.9)
Monocytes: 8 %
Neutrophils Absolute: 4.5 x10E3/uL (ref 1.4–7.0)
Neutrophils: 65 %
Platelets: 206 x10E3/uL (ref 150–450)
RBC: 4.69 x10E6/uL (ref 3.77–5.28)
RDW: 11.9 % (ref 11.7–15.4)
WBC: 7 x10E3/uL (ref 3.4–10.8)

## 2024-08-20 LAB — TSH: TSH: 0.947 u[IU]/mL (ref 0.450–4.500)

## 2024-08-20 LAB — LIPID PANEL
Chol/HDL Ratio: 2.9 ratio (ref 0.0–4.4)
Cholesterol, Total: 203 mg/dL — ABNORMAL HIGH (ref 100–199)
HDL: 69 mg/dL
LDL Chol Calc (NIH): 118 mg/dL — ABNORMAL HIGH (ref 0–99)
Triglycerides: 89 mg/dL (ref 0–149)
VLDL Cholesterol Cal: 16 mg/dL (ref 5–40)

## 2024-08-20 LAB — HEPATITIS B SURFACE ANTIBODY,QUALITATIVE: Hep B Surface Ab, Qual: NONREACTIVE

## 2024-08-24 ENCOUNTER — Ambulatory Visit: Payer: Self-pay | Admitting: Family Medicine

## 2024-09-10 ENCOUNTER — Encounter

## 2025-08-23 ENCOUNTER — Encounter: Admitting: Family Medicine
